# Patient Record
Sex: Male | Born: 1945 | Race: White | Hispanic: No | Marital: Married | Smoking: Former smoker
Health system: Southern US, Community
[De-identification: ages and names within clinical notes are randomized; demographics above are authoritative.]

## PROBLEM LIST (undated history)

## (undated) DIAGNOSIS — K219 Gastro-esophageal reflux disease without esophagitis: Secondary | ICD-10-CM

## (undated) DIAGNOSIS — E11621 Type 2 diabetes mellitus with foot ulcer: Secondary | ICD-10-CM

## (undated) DIAGNOSIS — M47816 Spondylosis without myelopathy or radiculopathy, lumbar region: Secondary | ICD-10-CM

## (undated) DIAGNOSIS — N182 Chronic kidney disease, stage 2 (mild): Secondary | ICD-10-CM

## (undated) DIAGNOSIS — H5440 Blindness, one eye, unspecified eye: Secondary | ICD-10-CM

## (undated) DIAGNOSIS — L97519 Non-pressure chronic ulcer of other part of right foot with unspecified severity: Secondary | ICD-10-CM

## (undated) DIAGNOSIS — C189 Malignant neoplasm of colon, unspecified: Secondary | ICD-10-CM

## (undated) DIAGNOSIS — Z972 Presence of dental prosthetic device (complete) (partial): Secondary | ICD-10-CM

## (undated) DIAGNOSIS — N200 Calculus of kidney: Secondary | ICD-10-CM

## (undated) DIAGNOSIS — E785 Hyperlipidemia, unspecified: Secondary | ICD-10-CM

## (undated) DIAGNOSIS — D649 Anemia, unspecified: Secondary | ICD-10-CM

## (undated) DIAGNOSIS — I10 Essential (primary) hypertension: Secondary | ICD-10-CM

## (undated) DIAGNOSIS — F02B0 Dementia in other diseases classified elsewhere, moderate, without behavioral disturbance, psychotic disturbance, mood disturbance, and anxiety: Secondary | ICD-10-CM

## (undated) DIAGNOSIS — I700 Atherosclerosis of aorta: Secondary | ICD-10-CM

## (undated) DIAGNOSIS — E119 Type 2 diabetes mellitus without complications: Secondary | ICD-10-CM

## (undated) DIAGNOSIS — G3183 Dementia with Lewy bodies: Secondary | ICD-10-CM

## (undated) DIAGNOSIS — E7800 Pure hypercholesterolemia, unspecified: Secondary | ICD-10-CM

## (undated) DIAGNOSIS — E1122 Type 2 diabetes mellitus with diabetic chronic kidney disease: Secondary | ICD-10-CM

## (undated) DIAGNOSIS — F0390 Unspecified dementia without behavioral disturbance: Secondary | ICD-10-CM

## (undated) DIAGNOSIS — Z87442 Personal history of urinary calculi: Secondary | ICD-10-CM

## (undated) HISTORY — PX: EYE SURGERY: SHX253

## (undated) HISTORY — PX: HERNIA REPAIR: SHX51

## (undated) HISTORY — DX: Malignant neoplasm of colon, unspecified: C18.9

## (undated) HISTORY — PX: APPENDECTOMY: SHX54

## (undated) HISTORY — PX: TONSILLECTOMY: SUR1361

## (undated) HISTORY — PX: KIDNEY STONE SURGERY: SHX686

---

## 2003-11-05 ENCOUNTER — Other Ambulatory Visit: Payer: Self-pay

## 2005-02-05 ENCOUNTER — Emergency Department: Payer: Self-pay | Admitting: Emergency Medicine

## 2005-10-01 ENCOUNTER — Emergency Department: Payer: Self-pay | Admitting: Emergency Medicine

## 2005-11-02 ENCOUNTER — Emergency Department: Payer: Self-pay | Admitting: Emergency Medicine

## 2009-10-19 ENCOUNTER — Observation Stay: Payer: Self-pay | Admitting: Internal Medicine

## 2012-06-23 ENCOUNTER — Emergency Department: Payer: Self-pay | Admitting: Emergency Medicine

## 2012-06-23 LAB — CBC
HGB: 13.7 g/dL (ref 13.0–18.0)
MCH: 31.1 pg (ref 26.0–34.0)
MCHC: 34.9 g/dL (ref 32.0–36.0)
Platelet: 232 10*3/uL (ref 150–440)
RBC: 4.41 10*6/uL (ref 4.40–5.90)
RDW: 13.9 % (ref 11.5–14.5)

## 2012-06-23 LAB — URIC ACID: Uric Acid: 5.4 mg/dL (ref 3.5–7.2)

## 2013-10-24 ENCOUNTER — Emergency Department: Payer: Self-pay | Admitting: Emergency Medicine

## 2014-04-08 ENCOUNTER — Emergency Department: Payer: Self-pay | Admitting: Emergency Medicine

## 2014-04-08 LAB — COMPREHENSIVE METABOLIC PANEL
Albumin: 4.1 g/dL (ref 3.4–5.0)
Alkaline Phosphatase: 80 U/L
Anion Gap: 3 — ABNORMAL LOW (ref 7–16)
BILIRUBIN TOTAL: 0.6 mg/dL (ref 0.2–1.0)
BUN: 24 mg/dL — AB (ref 7–18)
CHLORIDE: 104 mmol/L (ref 98–107)
CO2: 29 mmol/L (ref 21–32)
Calcium, Total: 9.3 mg/dL (ref 8.5–10.1)
Creatinine: 1.11 mg/dL (ref 0.60–1.30)
EGFR (African American): >60
EGFR (Non-African Amer.): >60
Glucose: 187 mg/dL — ABNORMAL HIGH (ref 65–99)
Osmolality: 281 (ref 275–301)
Potassium: 4.4 mmol/L (ref 3.5–5.1)
SGOT(AST): 29 U/L (ref 15–37)
SGPT (ALT): 21 U/L (ref 12–78)
SODIUM: 136 mmol/L (ref 136–145)
Total Protein: 8.1 g/dL (ref 6.4–8.2)

## 2014-04-08 LAB — URINALYSIS, COMPLETE
Bilirubin,UR: NEGATIVE
Glucose,UR: 150 mg/dL (ref 0–75)
Ketone: NEGATIVE
Leukocyte Esterase: NEGATIVE
Nitrite: NEGATIVE
PH: 5 (ref 4.5–8.0)
RBC,UR: 13 /HPF (ref 0–5)
Specific Gravity: 1.029 (ref 1.003–1.030)
Squamous Epithelial: NONE SEEN
WBC UR: 3 /HPF (ref 0–5)

## 2014-04-08 LAB — CBC
HCT: 43.5 % (ref 40.0–52.0)
HGB: 14.2 g/dL (ref 13.0–18.0)
MCH: 29.7 pg (ref 26.0–34.0)
MCHC: 32.6 g/dL (ref 32.0–36.0)
MCV: 91 fL (ref 80–100)
Platelet: 229 10*3/uL (ref 150–440)
RBC: 4.77 10*6/uL (ref 4.40–5.90)
RDW: 13.7 % (ref 11.5–14.5)
WBC: 8.7 10*3/uL (ref 3.8–10.6)

## 2014-04-09 LAB — CBC WITH DIFFERENTIAL/PLATELET
BASOS ABS: 0 10*3/uL (ref 0.0–0.1)
Basophil %: 0.4 %
Eosinophil #: 0.1 10*3/uL (ref 0.0–0.7)
Eosinophil %: 1.1 %
HCT: 37.7 % — AB (ref 40.0–52.0)
HGB: 12.6 g/dL — ABNORMAL LOW (ref 13.0–18.0)
Lymphocyte #: 0.9 10*3/uL — ABNORMAL LOW (ref 1.0–3.6)
Lymphocyte %: 9 %
MCH: 30.4 pg (ref 26.0–34.0)
MCHC: 33.5 g/dL (ref 32.0–36.0)
MCV: 91 fL (ref 80–100)
MONOS PCT: 6.6 %
Monocyte #: 0.6 x10 3/mm (ref 0.2–1.0)
NEUTROS PCT: 82.9 %
Neutrophil #: 8.1 10*3/uL — ABNORMAL HIGH (ref 1.4–6.5)
Platelet: 193 10*3/uL (ref 150–440)
RBC: 4.16 10*6/uL — ABNORMAL LOW (ref 4.40–5.90)
RDW: 13.5 % (ref 11.5–14.5)
WBC: 9.8 10*3/uL (ref 3.8–10.6)

## 2014-04-09 LAB — BASIC METABOLIC PANEL
ANION GAP: 7 (ref 7–16)
BUN: 28 mg/dL — ABNORMAL HIGH (ref 7–18)
CHLORIDE: 103 mmol/L (ref 98–107)
Calcium, Total: 8.9 mg/dL (ref 8.5–10.1)
Co2: 26 mmol/L (ref 21–32)
Creatinine: 1.46 mg/dL — ABNORMAL HIGH (ref 0.60–1.30)
EGFR (African American): 57 — ABNORMAL LOW
EGFR (Non-African Amer.): 49 — ABNORMAL LOW
Glucose: 185 mg/dL — ABNORMAL HIGH (ref 65–99)
Osmolality: 282 (ref 275–301)
POTASSIUM: 3.9 mmol/L (ref 3.5–5.1)
Sodium: 136 mmol/L (ref 136–145)

## 2014-04-10 ENCOUNTER — Observation Stay: Payer: Self-pay | Admitting: Urology

## 2014-04-10 LAB — URINE CULTURE

## 2014-04-13 DIAGNOSIS — E785 Hyperlipidemia, unspecified: Secondary | ICD-10-CM | POA: Insufficient documentation

## 2014-04-13 DIAGNOSIS — E119 Type 2 diabetes mellitus without complications: Secondary | ICD-10-CM | POA: Insufficient documentation

## 2014-04-13 DIAGNOSIS — E1122 Type 2 diabetes mellitus with diabetic chronic kidney disease: Secondary | ICD-10-CM | POA: Insufficient documentation

## 2014-04-13 DIAGNOSIS — I10 Essential (primary) hypertension: Secondary | ICD-10-CM | POA: Insufficient documentation

## 2014-04-13 DIAGNOSIS — N182 Chronic kidney disease, stage 2 (mild): Secondary | ICD-10-CM

## 2014-04-13 DIAGNOSIS — K219 Gastro-esophageal reflux disease without esophagitis: Secondary | ICD-10-CM | POA: Insufficient documentation

## 2014-04-17 ENCOUNTER — Ambulatory Visit: Payer: Self-pay | Admitting: Urology

## 2014-08-16 DIAGNOSIS — I700 Atherosclerosis of aorta: Secondary | ICD-10-CM | POA: Insufficient documentation

## 2015-02-21 NOTE — H&P (Signed)
PATIENT NAME:  Alex SilversmithGEORGE, Alex W MR#:  [id number] DATE OF BIRTH:  11/20/1945  DATE OF ADMISSION:  04/10/2014  CHIEF COMPLAINT: Right flank pain.   HISTORY OF PRESENT ILLNESS: Mr. [name] is a 69 year old white male with sudden onset of right flank pain on 06/09. He went to ER at that time, had a CT scan indicating the presence of a distal 4 mm stone and a 10 mm right renal stone. Pain became severe and he went back to the ER this morning. He also is being treated for urinary tract infection. He may have passed a stone in the distant past. He has never had surgery for stone disease.   ALLERGIES: No drug allergies.   CURRENT MEDICATIONS: Include: Metformin, lisinopril and pravastatin.   PAST SURGICAL HISTORY: Tonsillectomy in 1957. Right toe surgery in 2000.   PAST AND CURRENT MEDICAL CONDITIONS:  1. Diabetes.  2. Hypertension.   REVIEW OF SYSTEMS: The patient denied chest pain, shortness of breath, stroke, or heart disease.   FAMILY HISTORY: Negative for renal disease.   SOCIAL HISTORY: The patient denied tobacco or alcohol use.   PHYSICAL EXAMINATION:  GENERAL: A well-nourished white male in no distress.  HEENT: Sclerae were clear. Pupils were equally round, reactive to light and accommodation.  NECK: Supple. No palpable cervical adenopathy.  LUNGS: Clear to auscultation.  HEART: Regular rhythm and rate without audible murmurs.  ABDOMEN: Soft, nontender abdomen. No CVA tenderness.   GU/RECTAL: Deferred.  NEUROMUSCULAR: Alert and oriented x 3.   IMPRESSION:  1. Right ureterolithiasis.  2. Right nephrolithiasis. 3. Urinary tract infection.   PLAN: Cystoscopy with stent placement.    ____________________________ Suszanne ConnersMichael R. Evelene CroonWolff, MD mrw:sg D: 04/10/2014 07:36:49 ET T: 04/10/2014 07:59:25 ET JOB#: [id number]  cc: Suszanne ConnersMichael R. Evelene CroonWolff, MD, <Dictator> Orson ApeMICHAEL R Rhesa Forsberg MD ELECTRONICALLY SIGNED 04/10/2014 10:36

## 2015-02-21 NOTE — Op Note (Signed)
PATIENT NAME:  Alex SilversmithGEORGE, Alex W MR#:  [id number] DATE OF BIRTH:  06-Jan-1946  DATE OF PROCEDURE:  04/10/2014  PREOPERATIVE DIAGNOSES:  1.  Right ureterolithiasis.  2.  Right nephrolithiasis.   POSTOPERATIVE DIAGNOSIS:  1.  Right ureterolithiasis.  2.  Right nephrolithiasis.  3.  Bladder stone. 4.  Benign prostatic hypertrophy.  PROCEDURES PERFORMED: 1.  Cystoscopy with right stent placement.  2.  Fluoroscopy. 3.  Removal of bladder stone.   SURGEON: Anola GurneyMichael Wolff, M.D.   ANESTHETISTS: Dr. Ronalee BeltsBhandari and Dr. Evelene CroonWolff.   ANESTHETIC METHOD: General per Ronalee BeltsBhandari and local per Dr. Evelene CroonWolff.   INDICATIONS: See the history and physical. After informed consent, the patient requests the above procedures.   OPERATIVE SUMMARY: After adequate general anesthesia had been obtained, the patient was placed into dorsal lithotomy position and the perineum was prepped and draped in the usual fashion. Fluoroscopy confirmed presence of a 10 mm right renal stone and a 4 mm distal right ureteral stone. At this point, the 21-French cystoscope was coupled with the camera and then visually advanced into the bladder. The bladder was thoroughly inspected. The patient had a 4 mm stone present in the bladder. The stone was irrigated out of the bladder with a Toomey syringe. The bladder was moderately trabeculated. The patient had lateral lobe hypertrophy of the prostate with a prosthetic urethral length of 4 cm. No bladder mucosal tumors were identified. At this point, a 0.035 Glidewire was advanced up the right ureter under fluoroscopic guidance. A 6 x 26 cm double pigtail stent with suture attached distally was advanced over the guidewire and positioned in the ureter. Guidewire was then removed taking care to leave the stent in position. The bladder was then drained and the cystoscope was removed. Then 10 mL of viscous Xylocaine was instilled within the urethra and the bladder. The procedure was then terminated, and the patient  was transferred to the recovery room in stable condition.  ____________________________ Suszanne ConnersMichael R. Evelene CroonWolff, MD mrw:sb D: 04/10/2014 14:15:55 ET T: 04/10/2014 14:33:32 ET JOB#: [id number]  cc: Suszanne ConnersMichael R. Evelene CroonWolff, MD, <Dictator> Orson ApeMICHAEL R WOLFF MD ELECTRONICALLY SIGNED 04/10/2014 15:32

## 2015-03-22 ENCOUNTER — Emergency Department
Admission: EM | Admit: 2015-03-22 | Discharge: 2015-03-22 | Disposition: A | Discharge status: Home / Self Care | Payer: Medicare Other | Attending: Emergency Medicine | Admitting: Emergency Medicine

## 2015-03-22 ENCOUNTER — Encounter: Payer: Self-pay | Admitting: Emergency Medicine

## 2015-03-22 ENCOUNTER — Emergency Department: Payer: Medicare Other

## 2015-03-22 DIAGNOSIS — R109 Unspecified abdominal pain: Secondary | ICD-10-CM | POA: Diagnosis present

## 2015-03-22 DIAGNOSIS — E119 Type 2 diabetes mellitus without complications: Secondary | ICD-10-CM | POA: Insufficient documentation

## 2015-03-22 DIAGNOSIS — N200 Calculus of kidney: Secondary | ICD-10-CM | POA: Diagnosis not present

## 2015-03-22 HISTORY — DX: Type 2 diabetes mellitus without complications: E11.9

## 2015-03-22 HISTORY — DX: Calculus of kidney: N20.0

## 2015-03-22 LAB — URINALYSIS COMPLETE WITH MICROSCOPIC (ARMC ONLY)
BILIRUBIN URINE: NEGATIVE
Glucose, UA: >500 mg/dL — AB
Ketones, ur: NEGATIVE mg/dL
LEUKOCYTES UA: NEGATIVE
Nitrite: NEGATIVE
PROTEIN: 100 mg/dL — AB
SPECIFIC GRAVITY, URINE: 1.023 (ref 1.005–1.030)
Squamous Epithelial / LPF: NONE SEEN
pH: 5 (ref 5.0–8.0)

## 2015-03-22 LAB — COMPREHENSIVE METABOLIC PANEL
ALT: 18 U/L (ref 17–63)
AST: 20 U/L (ref 15–41)
Albumin: 4.2 g/dL (ref 3.5–5.0)
Alkaline Phosphatase: 78 U/L (ref 38–126)
Anion gap: 7 (ref 5–15)
BUN: 22 mg/dL — ABNORMAL HIGH (ref 6–20)
CHLORIDE: 102 mmol/L (ref 101–111)
CO2: 27 mmol/L (ref 22–32)
Calcium: 9 mg/dL (ref 8.9–10.3)
Creatinine, Ser: 0.97 mg/dL (ref 0.61–1.24)
GFR calc Af Amer: >60 mL/min (ref >60)
GLUCOSE: 231 mg/dL — AB (ref 65–99)
POTASSIUM: 4.2 mmol/L (ref 3.5–5.1)
Sodium: 136 mmol/L (ref 135–145)
Total Bilirubin: 0.5 mg/dL (ref 0.3–1.2)
Total Protein: 7.8 g/dL (ref 6.5–8.1)

## 2015-03-22 LAB — CBC
HCT: 38.8 % — ABNORMAL LOW (ref 40.0–52.0)
HEMOGLOBIN: 12.7 g/dL — AB (ref 13.0–18.0)
MCH: 29.4 pg (ref 26.0–34.0)
MCHC: 32.9 g/dL (ref 32.0–36.0)
MCV: 89.4 fL (ref 80.0–100.0)
Platelets: 212 10*3/uL (ref 150–440)
RBC: 4.34 MIL/uL — ABNORMAL LOW (ref 4.40–5.90)
RDW: 15 % — ABNORMAL HIGH (ref 11.5–14.5)
WBC: 7.1 10*3/uL (ref 3.8–10.6)

## 2015-03-22 MED ORDER — KETOROLAC TROMETHAMINE 30 MG/ML IJ SOLN
INTRAMUSCULAR | Status: AC
  Administered 2015-03-22: 30 mg via INTRAVENOUS
  Filled 2015-03-22: qty 1

## 2015-03-22 MED ORDER — KETOROLAC TROMETHAMINE 30 MG/ML IJ SOLN
30.0000 mg | Freq: Once | INTRAMUSCULAR | Status: AC
  Administered 2015-03-22: 30 mg via INTRAVENOUS

## 2015-03-22 MED ORDER — FENTANYL CITRATE (PF) 100 MCG/2ML IJ SOLN
25.0000 ug | Freq: Once | INTRAMUSCULAR | Status: AC
  Administered 2015-03-22: 25 ug via INTRAVENOUS

## 2015-03-22 MED ORDER — ONDANSETRON HCL 4 MG/2ML IJ SOLN
INTRAMUSCULAR | Status: AC
Start: 2015-03-22 — End: 2015-03-22
  Administered 2015-03-22: 4 mg via INTRAVENOUS
  Filled 2015-03-22: qty 2

## 2015-03-22 MED ORDER — MORPHINE SULFATE 2 MG/ML IJ SOLN
2.0000 mg | Freq: Once | INTRAMUSCULAR | Status: AC
  Administered 2015-03-22: 2 mg via INTRAVENOUS

## 2015-03-22 MED ORDER — ONDANSETRON HCL 4 MG/2ML IJ SOLN
4.0000 mg | Freq: Once | INTRAMUSCULAR | Status: AC
  Administered 2015-03-22: 4 mg via INTRAVENOUS

## 2015-03-22 MED ORDER — MORPHINE SULFATE 2 MG/ML IJ SOLN
INTRAMUSCULAR | Status: AC
  Administered 2015-03-22: 2 mg via INTRAVENOUS
  Filled 2015-03-22: qty 1

## 2015-03-22 MED ORDER — FENTANYL CITRATE (PF) 100 MCG/2ML IJ SOLN
INTRAMUSCULAR | Status: AC
  Administered 2015-03-22: 25 ug via INTRAVENOUS
  Filled 2015-03-22: qty 2

## 2015-03-22 MED ORDER — TAMSULOSIN HCL 0.4 MG PO CAPS: 0.4000 mg | ORAL_CAPSULE | Freq: Every day | ORAL | Status: DC

## 2015-03-22 MED ORDER — OXYCODONE-ACETAMINOPHEN 5-325 MG PO TABS: 1.0000 | ORAL_TABLET | ORAL | Status: DC | PRN

## 2015-03-22 NOTE — ED Notes (Signed)
Pt alert x4 respirations easy non labored. Ppp equal strong. Cap refill prompt

## 2015-03-22 NOTE — Discharge Instructions (Signed)

## 2015-03-22 NOTE — ED Notes (Signed)
Patient presents to the ED with right flank pain, nausea, and vomiting.  Patient states emesis was clear/white.  Patient denies abdominal pain, denies dysuria.  Patient states pain started this morning.  Patient reports sharp, constant pain.  Patient reports history of kidney stones and states this feels similar.

## 2015-03-22 NOTE — ED Provider Notes (Signed)
[facility] Emergency Department Provider Note  ____________________________________________  Time seen: 12:00 PM  I have reviewed the triage vital signs and the nursing notes.   HISTORY  Chief Complaint Flank Pain     HPI De BurrsLarry W Homan Sr. is a 69 y.o. male resents with 10 out of 10 sharp nonradiating rightflank pain 1 day. Positive nonbloody emesis. Patient admits to previous kidney stones 2 on the left measuring 4 mm and 10 mm. One of which required lithotripsy performed by Dr. Evelene CroonWolff. Pain is not alleviated or worsened with anything    Past Medical History  Diagnosis Date  . Diabetes mellitus without complication   . Kidney stones     There are no active problems to display for this patient.   No past surgical history on file.  No [place] outpatient prescriptions on file.  Allergies Review of patient's allergies indicates no known allergies.  No family history on file.  Social History History  Substance Use Topics  . Smoking status: Not on file  . Smokeless tobacco: Not on file  . Alcohol Use: Not on file    Review of Systems  Constitutional: Negative for fever. Eyes: Negative for visual changes. ENT: Negative for sore throat. Cardiovascular: Negative for chest pain. Respiratory: Negative for shortness of breath. Gastrointestinal: Negative for abdominal pain, positive vomiting  Genitourinary: Negative for dysuria. Positive right flank pain Musculoskeletal: Negative for back pain. Skin: Negative for rash. Neurological: Negative for headaches, focal weakness or numbness.  10-point ROS otherwise negative.  ____________________________________________   PHYSICAL EXAM:  VITAL SIGNS: ED Triage Vitals  Enc Vitals Group     BP 03/22/15 0946 177/87 mmHg     Pulse Rate 03/22/15 0946 62     Resp 03/22/15 0946 18     Temp 03/22/15 0946 98.7 F (37.1 C)     Temp Source 03/22/15 0946 Oral     SpO2 03/22/15 0946 100 %   Weight 03/22/15 0946 187 lb (84.823 kg)     Height 03/22/15 0946 6\' 1"  (1.854 m)     Head Cir --      Peak Flow --      Pain Score 03/22/15 0947 10     Pain Loc --      Pain Edu? --      Excl. in GC? --      Constitutional: Alert and oriented. Apparent distress Eyes: Conjunctivae are normal. PERRL. Normal extraocular movements. ENT   Head: Normocephalic and atraumatic.   Nose: No congestion/rhinnorhea.   Mouth/Throat: Mucous membranes are moist.   Neck: No stridor. Cardiovascular: Normal rate, regular rhythm. Normal and symmetric distal pulses are present in all extremities. No murmurs, rubs, or gallops. Respiratory: Normal respiratory effort without tachypnea nor retractions. Breath sounds are clear and equal bilaterally. No wheezes/rales/rhonchi. Gastrointestinal: Soft and nontender. No distention. There is no CVA tenderness. Genitourinary: deferred Musculoskeletal: Nontender with normal range of motion in all extremities. No joint effusions.  No lower extremity tenderness nor edema. Neurologic:  Normal speech and language. No gross focal neurologic deficits are appreciated. Speech is normal.  Skin:  Skin is warm, dry and intact. No rash noted. Psychiatric: Mood and affect are normal. Speech and behavior are normal. Patient exhibits appropriate insight and judgment.  ____________________________________________    LABS (pertinent positives/negatives)  Labs Reviewed  URINALYSIS COMPLETEWITH MICROSCOPIC ([facility])  - Abnormal; Notable for the following:    Color, Urine YELLOW (*)    APPearance CLEAR (*)    Glucose, UA >  500 (*)    Hgb urine dipstick 2+ (*)    Protein, ur 100 (*)    Bacteria, UA RARE (*)    All other components within normal limits  CBC - Abnormal; Notable for the following:    RBC 4.34 (*)    Hemoglobin 12.7 (*)    HCT 38.8 (*)    RDW 15.0 (*)    All other components within normal limits  COMPREHENSIVE METABOLIC PANEL - Abnormal; Notable for  the following:    Glucose, Bld 231 (*)    BUN 22 (*)    All other components within normal limits       RADIOLOGY  4 x 5 mm ureterolithiasis at the ureters vesicular junction. No hydronephrosis noted.  ____________________________________________    INITIAL IMPRESSION / ASSESSMENT AND PLAN / ED COURSE  Pertinent labs & imaging results that were available during my care of the patient were reviewed by me and considered in my medical decision making (see chart for details).  History physical exam, hematuria confirmed on UA consistent with possible ureterolithiasis. CT scan ordered to confirm. IV morphine and Zofran given for analgesia and antiemetic respectively. Pain improved status post analgesia. CT scan revealed right obstructing kidney stone in the UVJ  ____________________________________________   FINAL CLINICAL IMPRESSION(S) / ED DIAGNOSES  Final diagnoses:  Kidney stone on right side      Alex Current, MD 03/25/15 2325

## 2016-08-08 ENCOUNTER — Other Ambulatory Visit: Payer: Self-pay | Admitting: Orthopedic Surgery

## 2016-08-08 DIAGNOSIS — S46001A Unspecified injury of muscle(s) and tendon(s) of the rotator cuff of right shoulder, initial encounter: Secondary | ICD-10-CM

## 2016-08-19 ENCOUNTER — Ambulatory Visit
Admission: RE | Admit: 2016-08-19 | Discharge: 2016-08-19 | Disposition: A | Discharge status: Home / Self Care | Payer: Medicare Other | Source: Ambulatory Visit | Attending: Orthopedic Surgery | Admitting: Orthopedic Surgery

## 2016-08-19 DIAGNOSIS — X58XXXA Exposure to other specified factors, initial encounter: Secondary | ICD-10-CM | POA: Insufficient documentation

## 2016-08-19 DIAGNOSIS — M19011 Primary osteoarthritis, right shoulder: Secondary | ICD-10-CM | POA: Diagnosis not present

## 2016-08-19 DIAGNOSIS — M25311 Other instability, right shoulder: Secondary | ICD-10-CM | POA: Diagnosis not present

## 2016-08-19 DIAGNOSIS — M75121 Complete rotator cuff tear or rupture of right shoulder, not specified as traumatic: Secondary | ICD-10-CM | POA: Diagnosis not present

## 2016-08-19 DIAGNOSIS — S46001A Unspecified injury of muscle(s) and tendon(s) of the rotator cuff of right shoulder, initial encounter: Secondary | ICD-10-CM | POA: Diagnosis present

## 2016-09-14 DIAGNOSIS — M75121 Complete rotator cuff tear or rupture of right shoulder, not specified as traumatic: Secondary | ICD-10-CM | POA: Insufficient documentation

## 2016-09-14 DIAGNOSIS — M7581 Other shoulder lesions, right shoulder: Secondary | ICD-10-CM | POA: Insufficient documentation

## 2016-09-30 ENCOUNTER — Encounter
Admission: RE | Admit: 2016-09-30 | Discharge: 2016-09-30 | Disposition: A | Discharge status: Home / Self Care | Payer: Medicare Other | Source: Ambulatory Visit | Attending: Surgery | Admitting: Surgery

## 2016-09-30 DIAGNOSIS — Z01810 Encounter for preprocedural cardiovascular examination: Secondary | ICD-10-CM | POA: Insufficient documentation

## 2016-09-30 DIAGNOSIS — Z01812 Encounter for preprocedural laboratory examination: Secondary | ICD-10-CM | POA: Insufficient documentation

## 2016-09-30 DIAGNOSIS — I10 Essential (primary) hypertension: Secondary | ICD-10-CM | POA: Diagnosis not present

## 2016-09-30 HISTORY — DX: Personal history of urinary calculi: Z87.442

## 2016-09-30 HISTORY — DX: Anemia, unspecified: D64.9

## 2016-09-30 HISTORY — DX: Gastro-esophageal reflux disease without esophagitis: K21.9

## 2016-09-30 LAB — CBC
HEMATOCRIT: 37.4 % — AB (ref 40.0–52.0)
HEMOGLOBIN: 12.6 g/dL — AB (ref 13.0–18.0)
MCH: 30.2 pg (ref 26.0–34.0)
MCHC: 33.7 g/dL (ref 32.0–36.0)
MCV: 89.6 fL (ref 80.0–100.0)
Platelets: 199 10*3/uL (ref 150–440)
RBC: 4.18 MIL/uL — AB (ref 4.40–5.90)
RDW: 14.6 % — ABNORMAL HIGH (ref 11.5–14.5)
WBC: 4.2 10*3/uL (ref 3.8–10.6)

## 2016-09-30 NOTE — Patient Instructions (Signed)
  Your procedure is scheduled on: 10-06-16 [facility](THURSDAY) Report to Same Day Surgery 2nd floor medical mall [facility](Medical Mall Entrance-take elevator on left to 2nd floor.  Check in with surgery information desk.) To find out your arrival time please call [phone] between 1PM - 3PM on 10-05-16 [facility](WEDNESDAY)  Remember: Instructions that are not followed completely may result in serious medical risk, up to and including death, or upon the discretion of your surgeon and anesthesiologist your surgery may need to be rescheduled.    _x___ 1. Do not eat food or drink liquids after midnight. No gum chewing or hard candies.     __x__ 2. No Alcohol for 24 hours before or after surgery.   __x__3. No Smoking for 24 prior to surgery.   ____  4. Bring all medications with you on the day of surgery if instructed.    __x__ 5. Notify your doctor if there is any change in your medical condition     (cold, fever, infections).     Do not wear jewelry, make-up, hairpins, clips or nail polish.  Do not wear lotions, powders, or perfumes. You may wear deodorant.  Do not shave 48 hours prior to surgery. Men may shave face and neck.  Do not bring valuables to the hospital.    [facility] not [facility] any belongings or valuables.               Contacts, dentures or bridgework may not be worn into surgery.  Leave your suitcase in the car. After surgery it may be brought to your room.  For patients admitted to the hospital, discharge time is determined by your treatment team.   Patients discharged the day of surgery will not be allowed to drive home.  You will need someone to drive you home and stay with you the night of your procedure.    Please read over the following fact sheets that you were given:   [facility] Preparing for Surgery and or MRSA Information   _X___ Take these medicines the morning of surgery with A SIP OF WATER:    1. PRILOSEC  2. TAKE A PRILOSEC ON Wednesday NIGHT BEFORE BED  (10-05-16)  3.  4.  5.  6.  ____Fleets enema or Magnesium Citrate as directed.   _x___ Use CHG Soap or sage wipes as directed on instruction sheet   ____ Use inhalers on the day of surgery and bring to hospital day of surgery  _X___ Stop metformin 2 days prior to surgery-LAST DOSE OF GLYBURIDE-METFORMIN ON Monday, December 4TH    ____ Take 1/2 of usual insulin dose the night before surgery and none on the morning of surgery.   _X___ Stop Aspirin, Coumadin, Pllavix ,Eliquis, Effient, or Pradaxa-STOP ASPIRIN NOW  x__ Stop Anti-inflammatories such as Advil, Aleve, Ibuprofen, Motrin, Naproxen,          Naprosyn, Goodies powders or aspirin products. Ok to take Tylenol.   ____ Stop supplements until after surgery.    ____ Bring C-Pap to the hospital.

## 2016-10-05 NOTE — Pre-Procedure Instructions (Signed)
Alex Donath, MD - 09/16/2016 9:30 AM EST Formatting of this note may be different from the original. MEDICARE WELLNESS VISIT    Providers [facility] [place] [facility], Dr Elvina Mattes, Lupita Leash ortho, Dr Ouida Sills Functional Assessment (1) Hearing: Demonstrates no difficulty in hearing during normal conversation (2) Risk of Falls: Patient denies any falls or near falls in the last year (3) Home Safety: Patient feels secure in their home (4) Activities of Daily Living: Independently manages personal grooming and household chores, including cooking, cleaning and laundry. Manages Personal finances without assistance.    Depression Screening Denies loss of interest in normal activities, has no episodes of weeping or anxiety and reports no changes in appetite or sleep.  Cognitive Impairment Patient denies episodes of loosing things, being forgetful. Seems oriented to person, place and time. Responses appear appropriate and timely to this observer.   PREVENTION PLAN  Item Name Frequency Month Due Year(s) Due Cardiovascular every 6 months on pravastatin Diabetes every 4 months with disease Colonoscopy 2010 last and due 2020 Glaucoma Annually via [facility] [facility] Flu Vaccine Annually Fall Pneumococcal Vaccine 2011 Prevnar 13 prescription 11-17 zostavax prescription given 6-14 but didn't get Smoking Cessation Not Applicable   Other Personalized Health Advice Diabetic diet and walking frequently and yard work  End of Life Counseling No living will, no long term life support. Full code.   Physician Kittson Ouida Sills, M.D.   Alex Soto is a 70 y.o. male here for follow up of their medical problems  CHIEF COMPLAINT:  Follow up medical problems in the problem list and as discussed in the history and assessment areas as well as new complaints as listed.   Patient Active Problem List  Diagnosis  . Type 2 diabetes mellitus with stage 2 chronic kidney  disease (CMS-HCC)  . GERD (gastroesophageal reflux disease)  . HTN (hypertension), benign  . Hyperlipidemia, unspecified  . Atherosclerosis of abdominal aorta (CMS-HCC)  . Complete tear of right rotator cuff  . Tendinitis of right rotator cuff   HISTORY OF PRESENT ILLNESS:  Atherosclerosis of abdominal aorta No abdominal pain or back pain on medical treatment  HTN (hypertension), benign Is compliant with hypertensive medications without clear side effects or lack of control.   Hyperlipidemia Appropriate diet is being attempted and myalgia's are not noted.   Type 2 diabetes mellitus with stage 2 chronic kidney disease Continuing on diet as well as prescribed regimen without severe hypoglycemia being noted. Nausea and itching are not symptomatic and nsaids are being avoided.   Past Medical History:  Diagnosis Date  . 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 Diabetes mellitus type 2, uncomplicated (CMS-HCC)  . DJD (degenerative joint disease), lumbar  . Hyperlipidemia, unspecified  . Hypertension   Past Surgical History:  Procedure Laterality Date  . APPENDECTOMY  . TONSILLECTOMY   No fever chills or sweats No nausea, vomiting or diarrhea No chest pain, shortness of breath   Social History   Social History  . Marital status: Married  Spouse name: N/A  . Number of children: N/A  . Years of education: N/A   Occupational History  . Not on file.   Social History Main Topics  . Smoking status: Former Smoker  Quit date: 04/02/1974  . Smokeless tobacco: Never Used  . Alcohol use No  . Drug use: No  . Sexual activity: Defer   Other Topics Concern  . Not on file   Social History Narrative  . No  narrative on file     Current Outpatient Prescriptions:  . glyBURIDE-metFORMIN (GLUCOVANCE) 2.5-500 mg tablet, Take 1 tablet by mouth 2 (two) times daily., Disp: 180 tablet, Rfl: 11 . ipratropium (ATROVENT) 0.06 % nasal spray, Place 2 sprays into both nostrils 3  (three) times daily., Disp: 15 mL, Rfl: 11 . lisinopril-hydrochlorothiazide (PRINZIDE,ZESTORETIC) 20-12.5 mg tablet, Take 1 tablet by mouth once daily., Disp: 90 tablet, Rfl: 11 . pioglitazone (ACTOS) 15 MG tablet, Take 1 tablet (15 mg total) by mouth once daily., Disp: 30 tablet, Rfl: 11 . pneumococcal vaccine 13-valent, PF, (PREVNAR 13) IM syringe, Inject 0.5 mLs into the muscle once for 1 dose., Disp: 1 each, Rfl: 0 . pravastatin (PRAVACHOL) 80 MG tablet, Take 1 tablet (80 mg total) by mouth once daily., Disp: 90 tablet, Rfl: 11  Vitals:  09/16/16 0939  BP: 137/71  Pulse: 68  Resp: 16  Body mass index is 24.54 kg/m. No acute distress Lungs; clear to ascultation Heart; Regular rate and rhythm  Abdomen; Soft and flat, normal bowel sounds Extremities; No clubbing, cyanosis or edema  Appointment on 08/30/2016  Component Date Value Ref Range Status  . Glucose 08/30/2016 155* 70 - 110 mg/dL Final  . Sodium 08/30/2016 140 136 - 145 mmol/L Final  . Potassium 08/30/2016 4.2 3.6 - 5.1 mmol/L Final  . Chloride 08/30/2016 100 97 - 109 mmol/L Final  . Carbon Dioxide (CO2) 08/30/2016 30.7 22.0 - 32.0 mmol/L Final  . Calcium 08/30/2016 9.7 8.7 - 10.3 mg/dL Final  . Urea Nitrogen (BUN) 08/30/2016 18 7 - 25 mg/dL Final  . Creatinine 08/30/2016 0.8 0.7 - 1.3 mg/dL Final  . Glomerular Filtration Rate (eGFR),* 08/30/2016 96 >60 mL/min/1.73sq m Final  . BUN/Crea Ratio 08/30/2016 22.5* 6.0 - 20.0 Final  . Anion Gap w/K 08/30/2016 13.5 6.0 - 16.0 Final  . Hemoglobin A1C 08/30/2016 7.6* 4.2 - 5.6 % Final  . Average Blood Glucose (Calc) 08/30/2016 171 mg/dL Final  . Protein, Total 08/30/2016 6.9 6.1 - 7.9 g/dL Final  . Albumin 08/30/2016 4.3 3.5 - 4.8 g/dL Final  . Bilirubin, Total 08/30/2016 0.6 0.3 - 1.2 mg/dL Final  . Bilirubin, Conjugated 08/30/2016 0.10 0.00 - 0.20 mg/dL Final  . Alk Phos (alkaline Phosphatase) 08/30/2016 66 34 - 104 U/L Final  . AST 08/30/2016 13 8 - 39 U/L Final  . ALT  08/30/2016 13 6 - 57 U/L Final  . Cholesterol, Total 08/30/2016 145 100 - 200 mg/dL Final  . Triglyceride 08/30/2016 111 35 - 199 mg/dL Final  . HDL (High Density Lipoprotein) Cho* 08/30/2016 39.8 29.0 - 71.0 mg/dL Final  . LDL (Low Density Lipoprotien), Cal* 08/30/2016 83 0 - 130 mg/dL Final  . VLDL Cholesterol 08/30/2016 22 mg/dL Final  . Cholesterol/HDL Ratio 08/30/2016 3.6 Final  . Urine Albumin, Random 08/30/2016 902 mg/L Final   ASSESSMENT AND PLAN:  Diagnoses and all orders for this visit:  Type 2 diabetes mellitus with stage 2 chronic kidney disease, without long-term current use of insulin (CMS-HCC) - Basic Metabolic Panel (BMP); Future - Hemoglobin A1C; Future - Hepatic Function Panel (HFP); Future - Lipid Panel w/calc LDL; Future - Microalbumin, Random Urine; Future  Routine general medical examination at a health care facility  HTN (hypertension), benign - Basic Metabolic Panel (BMP); Future - Hemoglobin A1C; Future - Hepatic Function Panel (HFP); Future - Lipid Panel w/calc LDL; Future - Microalbumin, Random Urine; Future  Pure hypercholesterolemia - Basic Metabolic Panel (BMP); Future - Hemoglobin A1C; Future - Hepatic Function Panel (  HFP); Future - Lipid Panel w/calc LDL; Future - Microalbumin, Random Urine; Future  Atherosclerosis of abdominal aorta (CMS-HCC)  Other orders - pneumococcal vaccine 13-valent, PF, (PREVNAR 13) IM syringe; Inject 0.5 mLs into the muscle once for 1 dose.  All listed problems are stable and associated medications and orders are noted. Will schedule a follow up appointment. Shoulder surgery is being considered and he can proceed directly as he is functional without symptoms

## 2016-10-06 ENCOUNTER — Encounter
Admission: RE | Disposition: A | Discharge status: Home / Self Care | Payer: Self-pay | Source: Ambulatory Visit | Attending: Surgery

## 2016-10-06 ENCOUNTER — Ambulatory Visit: Payer: Medicare Other | Admitting: Anesthesiology

## 2016-10-06 ENCOUNTER — Ambulatory Visit
Admission: RE | Admit: 2016-10-06 | Discharge: 2016-10-06 | Disposition: A | Discharge status: Home / Self Care | Payer: Medicare Other | Source: Ambulatory Visit | Attending: Surgery | Admitting: Surgery

## 2016-10-06 ENCOUNTER — Encounter: Payer: Self-pay | Admitting: *Deleted

## 2016-10-06 DIAGNOSIS — K219 Gastro-esophageal reflux disease without esophagitis: Secondary | ICD-10-CM | POA: Insufficient documentation

## 2016-10-06 DIAGNOSIS — E785 Hyperlipidemia, unspecified: Secondary | ICD-10-CM | POA: Insufficient documentation

## 2016-10-06 DIAGNOSIS — Z79899 Other long term (current) drug therapy: Secondary | ICD-10-CM | POA: Diagnosis not present

## 2016-10-06 DIAGNOSIS — H5461 Unqualified visual loss, right eye, normal vision left eye: Secondary | ICD-10-CM | POA: Insufficient documentation

## 2016-10-06 DIAGNOSIS — Z8249 Family history of ischemic heart disease and other diseases of the circulatory system: Secondary | ICD-10-CM | POA: Insufficient documentation

## 2016-10-06 DIAGNOSIS — M75101 Unspecified rotator cuff tear or rupture of right shoulder, not specified as traumatic: Secondary | ICD-10-CM | POA: Diagnosis present

## 2016-10-06 DIAGNOSIS — E119 Type 2 diabetes mellitus without complications: Secondary | ICD-10-CM | POA: Diagnosis not present

## 2016-10-06 DIAGNOSIS — M479 Spondylosis, unspecified: Secondary | ICD-10-CM | POA: Insufficient documentation

## 2016-10-06 DIAGNOSIS — M75121 Complete rotator cuff tear or rupture of right shoulder, not specified as traumatic: Secondary | ICD-10-CM | POA: Insufficient documentation

## 2016-10-06 DIAGNOSIS — Y9301 Activity, walking, marching and hiking: Secondary | ICD-10-CM | POA: Diagnosis not present

## 2016-10-06 DIAGNOSIS — Z87891 Personal history of nicotine dependence: Secondary | ICD-10-CM | POA: Diagnosis not present

## 2016-10-06 DIAGNOSIS — Z833 Family history of diabetes mellitus: Secondary | ICD-10-CM | POA: Insufficient documentation

## 2016-10-06 DIAGNOSIS — Z809 Family history of malignant neoplasm, unspecified: Secondary | ICD-10-CM | POA: Diagnosis not present

## 2016-10-06 DIAGNOSIS — I10 Essential (primary) hypertension: Secondary | ICD-10-CM | POA: Insufficient documentation

## 2016-10-06 DIAGNOSIS — S46011A Strain of muscle(s) and tendon(s) of the rotator cuff of right shoulder, initial encounter: Secondary | ICD-10-CM | POA: Diagnosis not present

## 2016-10-06 DIAGNOSIS — W010XXA Fall on same level from slipping, tripping and stumbling without subsequent striking against object, initial encounter: Secondary | ICD-10-CM | POA: Insufficient documentation

## 2016-10-06 HISTORY — PX: SHOULDER ARTHROSCOPY WITH BICEPS TENDON REPAIR: SHX5674

## 2016-10-06 HISTORY — PX: SHOULDER ARTHROSCOPY WITH SUBACROMIAL DECOMPRESSION: SHX5684

## 2016-10-06 HISTORY — PX: SHOULDER ARTHROSCOPY WITH OPEN ROTATOR CUFF REPAIR: SHX6092

## 2016-10-06 LAB — GLUCOSE, CAPILLARY
Glucose-Capillary: 140 mg/dL — ABNORMAL HIGH (ref 65–99)
Glucose-Capillary: 134 mg/dL — ABNORMAL HIGH (ref 65–99)

## 2016-10-06 SURGERY — ARTHROSCOPY, SHOULDER WITH REPAIR, ROTATOR CUFF, OPEN
Anesthesia: General | Site: Shoulder | Laterality: Right | Wound class: Clean

## 2016-10-06 MED ORDER — ONDANSETRON HCL 4 MG/2ML IJ SOLN
INTRAMUSCULAR | Status: DC | PRN
  Administered 2016-10-06: 4 mg via INTRAVENOUS

## 2016-10-06 MED ORDER — ROPIVACAINE HCL 5 MG/ML IJ SOLN
INTRAMUSCULAR | Status: AC
  Filled 2016-10-06: qty 40

## 2016-10-06 MED ORDER — SODIUM CHLORIDE 0.9 % IV SOLN
INTRAVENOUS | Status: DC
  Administered 2016-10-06: 11:00:00 via INTRAVENOUS

## 2016-10-06 MED ORDER — OXYCODONE HCL 5 MG PO TABS: 5.0000 mg | ORAL_TABLET | ORAL | 0 refills | Status: DC | PRN

## 2016-10-06 MED ORDER — BUPIVACAINE HCL (PF) 0.5 % IJ SOLN
INTRAMUSCULAR | Status: AC
  Filled 2016-10-06: qty 30

## 2016-10-06 MED ORDER — LIDOCAINE HCL (PF) 4 % IJ SOLN
INTRAMUSCULAR | Status: DC | PRN
  Administered 2016-10-06: 4 mL via RESPIRATORY_TRACT

## 2016-10-06 MED ORDER — SUGAMMADEX SODIUM 200 MG/2ML IV SOLN
INTRAVENOUS | Status: DC | PRN
  Administered 2016-10-06: 170 mg via INTRAVENOUS

## 2016-10-06 MED ORDER — PROPOFOL 10 MG/ML IV BOLUS
INTRAVENOUS | Status: DC | PRN
  Administered 2016-10-06: 180 mg via INTRAVENOUS

## 2016-10-06 MED ORDER — FENTANYL CITRATE (PF) 100 MCG/2ML IJ SOLN
25.0000 ug | INTRAMUSCULAR | Status: DC | PRN
  Administered 2016-10-06 (×4): 25 ug via INTRAVENOUS

## 2016-10-06 MED ORDER — PHENYLEPHRINE HCL 10 MG/ML IJ SOLN
INTRAVENOUS | Status: DC | PRN
  Administered 2016-10-06: 30 ug/min via INTRAVENOUS

## 2016-10-06 MED ORDER — FENTANYL CITRATE (PF) 100 MCG/2ML IJ SOLN
INTRAMUSCULAR | Status: AC
  Administered 2016-10-06: 25 ug via INTRAVENOUS
  Filled 2016-10-06: qty 2

## 2016-10-06 MED ORDER — ROPIVACAINE HCL 5 MG/ML IJ SOLN
30.0000 mL | Freq: Once | INTRAMUSCULAR | Status: AC
  Administered 2016-10-06: 30 mL via PERINEURAL

## 2016-10-06 MED ORDER — MIDAZOLAM HCL 2 MG/2ML IJ SOLN
INTRAMUSCULAR | Status: AC
  Administered 2016-10-06: 1.5 mg via INTRAVENOUS
  Filled 2016-10-06: qty 2

## 2016-10-06 MED ORDER — LIDOCAINE HCL (CARDIAC) 20 MG/ML IV SOLN
INTRAVENOUS | Status: DC | PRN
  Administered 2016-10-06: 100 mg via INTRAVENOUS

## 2016-10-06 MED ORDER — BUPIVACAINE-EPINEPHRINE 0.25% -1:200000 IJ SOLN
INTRAMUSCULAR | Status: DC | PRN
  Administered 2016-10-06: 30 mL

## 2016-10-06 MED ORDER — EPINEPHRINE PF 1 MG/ML IJ SOLN
INTRAMUSCULAR | Status: DC | PRN
  Administered 2016-10-06: 2 mL

## 2016-10-06 MED ORDER — ONDANSETRON HCL 4 MG/2ML IJ SOLN: 4.0000 mg | Freq: Once | INTRAMUSCULAR | Status: DC | PRN

## 2016-10-06 MED ORDER — EPINEPHRINE PF 1 MG/ML IJ SOLN
INTRAMUSCULAR | Status: AC
  Filled 2016-10-06: qty 1

## 2016-10-06 MED ORDER — LIDOCAINE HCL (PF) 1 % IJ SOLN
5.0000 mL | Freq: Once | INTRAMUSCULAR | Status: AC
  Administered 2016-10-06: 5 mL via INTRADERMAL

## 2016-10-06 MED ORDER — FENTANYL CITRATE (PF) 100 MCG/2ML IJ SOLN
INTRAMUSCULAR | Status: DC | PRN
  Administered 2016-10-06: 50 ug via INTRAVENOUS
  Administered 2016-10-06: 200 ug via INTRAVENOUS

## 2016-10-06 MED ORDER — LIDOCAINE HCL (PF) 1 % IJ SOLN
INTRAMUSCULAR | Status: AC
  Administered 2016-10-06: 5 mL via INTRADERMAL
  Filled 2016-10-06: qty 5

## 2016-10-06 MED ORDER — MIDAZOLAM HCL 2 MG/2ML IJ SOLN
1.5000 mg | Freq: Once | INTRAMUSCULAR | Status: AC
  Administered 2016-10-06: 1.5 mg via INTRAVENOUS

## 2016-10-06 MED ORDER — CEFAZOLIN SODIUM-DEXTROSE 2-4 GM/100ML-% IV SOLN
INTRAVENOUS | Status: AC
  Filled 2016-10-06: qty 100

## 2016-10-06 MED ORDER — EPINEPHRINE PF 1 MG/ML IJ SOLN
INTRAMUSCULAR | Status: AC
  Filled 2016-10-06: qty 3

## 2016-10-06 MED ORDER — ROCURONIUM BROMIDE 100 MG/10ML IV SOLN
INTRAVENOUS | Status: DC | PRN
Start: 2016-10-06 — End: 2016-10-06
  Administered 2016-10-06: 10 mg via INTRAVENOUS
  Administered 2016-10-06: 50 mg via INTRAVENOUS
  Administered 2016-10-06: 10 mg via INTRAVENOUS

## 2016-10-06 MED ORDER — BUPIVACAINE-EPINEPHRINE (PF) 0.25% -1:200000 IJ SOLN
INTRAMUSCULAR | Status: AC
  Filled 2016-10-06: qty 30

## 2016-10-06 MED ORDER — CEFAZOLIN SODIUM-DEXTROSE 2-4 GM/100ML-% IV SOLN
2.0000 g | Freq: Once | INTRAVENOUS | Status: AC
  Administered 2016-10-06: 2 g via INTRAVENOUS

## 2016-10-06 MED ORDER — ROPIVACAINE HCL 5 MG/ML IJ SOLN
INTRAMUSCULAR | Status: AC
  Administered 2016-10-06: 30 mL via PERINEURAL
  Filled 2016-10-06: qty 20

## 2016-10-06 SURGICAL SUPPLY — 45 items
ANCHOR JUGGERKNOT WTAP NDL 2.9 (Anchor) ×10 IMPLANT
ANCHOR SUT QUATTRO KNTLS 4.5 (Anchor) ×6 IMPLANT
BIT DRILL JUGRKNT W/NDL BIT2.9 (DRILL) ×1 IMPLANT
BLADE FULL RADIUS 3.5 (BLADE) ×2 IMPLANT
BLADE SHAVER 4.5X7 STR FR (MISCELLANEOUS) IMPLANT
BUR ACROMIONIZER 4.0 (BURR) ×2 IMPLANT
BUR BR 5.5 WIDE MOUTH (BURR) IMPLANT
CANNULA SHAVER 8MMX76MM (CANNULA) ×2 IMPLANT
CHLORAPREP W/TINT 26ML (MISCELLANEOUS) ×4 IMPLANT
COVER MAYO STAND STRL (DRAPES) ×2 IMPLANT
DRAPE IMP U-DRAPE 54X76 (DRAPES) ×4 IMPLANT
DRILL JUGGERKNOT W/NDL BIT 2.9 (DRILL) ×2
DRSG OPSITE POSTOP 4X8 (GAUZE/BANDAGES/DRESSINGS) IMPLANT
ELECT REM PT RETURN 9FT ADLT (ELECTROSURGICAL) ×2
ELECTRODE REM PT RTRN 9FT ADLT (ELECTROSURGICAL) ×1 IMPLANT
GAUZE PETRO XEROFOAM 1X8 (MISCELLANEOUS) ×2 IMPLANT
GAUZE SPONGE 4X4 12PLY STRL (GAUZE/BANDAGES/DRESSINGS) ×2 IMPLANT
GLOVE BIO SURGEON STRL SZ7.5 (GLOVE) ×4 IMPLANT
GLOVE BIO SURGEON STRL SZ8 (GLOVE) ×4 IMPLANT
GLOVE BIOGEL PI IND STRL 8 (GLOVE) ×1 IMPLANT
GLOVE BIOGEL PI INDICATOR 8 (GLOVE) ×1
GLOVE INDICATOR 8.0 STRL GRN (GLOVE) ×2 IMPLANT
GOWN STRL REUS W/ TWL LRG LVL3 (GOWN DISPOSABLE) ×2 IMPLANT
GOWN STRL REUS W/ TWL XL LVL3 (GOWN DISPOSABLE) ×1 IMPLANT
GOWN STRL REUS W/TWL LRG LVL3 (GOWN DISPOSABLE) ×2
GOWN STRL REUS W/TWL XL LVL3 (GOWN DISPOSABLE) ×1
GRASPER SUT 15 45D LOW PRO (SUTURE) IMPLANT
IV LACTATED RINGER IRRG 3000ML (IV SOLUTION) ×2
IV LR IRRIG 3000ML ARTHROMATIC (IV SOLUTION) ×2 IMPLANT
MANIFOLD NEPTUNE II (INSTRUMENTS) ×2 IMPLANT
MASK FACE SPIDER DISP (MASK) ×2 IMPLANT
MAT BLUE FLOOR 46X72 FLO (MISCELLANEOUS) ×2 IMPLANT
NEEDLE REVERSE CUT 1/2 CRC (NEEDLE) IMPLANT
PACK ARTHROSCOPY SHOULDER (MISCELLANEOUS) ×2 IMPLANT
SLING ARM LRG DEEP (SOFTGOODS) IMPLANT
SLING ULTRA II LG (MISCELLANEOUS) ×2 IMPLANT
STAPLER SKIN PROX 35W (STAPLE) ×2 IMPLANT
STRAP SAFETY BODY (MISCELLANEOUS) ×2 IMPLANT
SUT ETHIBOND 0 MO6 C/R (SUTURE) ×4 IMPLANT
SUT VIC AB 2-0 CT1 27 (SUTURE) ×2
SUT VIC AB 2-0 CT1 TAPERPNT 27 (SUTURE) ×2 IMPLANT
TAPE MICROFOAM 4IN (TAPE) ×2 IMPLANT
TUBING ARTHRO INFLOW-ONLY STRL (TUBING) ×2 IMPLANT
TUBING CONNECTING 10 (TUBING) ×2 IMPLANT
WAND HAND CNTRL MULTIVAC 90 (MISCELLANEOUS) ×2 IMPLANT

## 2016-10-06 NOTE — Anesthesia Preprocedure Evaluation (Addendum)
Anesthesia Evaluation  Patient identified by MRN, date of birth, ID band Patient awake    Reviewed: Allergy & Precautions, H&P , NPO status , Patient's Chart, lab work & pertinent test results, reviewed documented beta blocker date and time   Airway Mallampati: II  TM Distance: >3 FB Neck ROM: full    Dental  (+) Edentulous Upper, Edentulous Lower   Pulmonary neg pulmonary ROS, former smoker,    Pulmonary exam normal        Cardiovascular negative cardio ROS Normal cardiovascular exam Rhythm:regular Rate:Normal     Neuro/Psych negative neurological ROS  negative psych ROS   GI/Hepatic negative GI ROS, Neg liver ROS, GERD  Medicated,  Endo/Other  negative endocrine ROSdiabetes  Renal/GU Renal diseasenegative Renal ROS  negative genitourinary   Musculoskeletal   Abdominal   Peds  Hematology negative hematology ROS (+) anemia ,   Anesthesia Other Findings Past Medical History: No date: Anemia No date: Diabetes mellitus without complication (HCC) No date: GERD (gastroesophageal reflux disease) No date: History of kidney stones No date: Kidney stones Past Surgical History: No date: APPENDECTOMY No date: EYE SURGERY     Comment: age 89 No date: KIDNEY STONE SURGERY No date: TONSILLECTOMY BMI    Body Mass Index:  24.67 kg/m     Reproductive/Obstetrics negative OB ROS                             Anesthesia Physical Anesthesia Plan  ASA: II  Anesthesia Plan: General ETT   Post-op Pain Management:  Regional for Post-op pain   Induction: Intravenous  Airway Management Planned:   Additional Equipment:   Intra-op Plan:   Post-operative Plan:   Informed Consent: I have reviewed the patients History and Physical, chart, labs and discussed the procedure including the risks, benefits and alternatives for the proposed anesthesia with the patient or authorized representative who has  indicated his/her understanding and acceptance.   Dental Advisory Given  Plan Discussed with: CRNA  Anesthesia Plan Comments:        Anesthesia Quick Evaluation

## 2016-10-06 NOTE — Discharge Instructions (Addendum)
Keep dressing dry and intact.  May shower after dressing changed on post-op day #4 (Monday).  Cover staples with Band-Aids after drying off. Apply ice frequently to shoulder. Take ibuprofen 600 mg TID with meals for 7-10 days, then as necessary. Take oxycodone as prescribed when needed.  May supplement with ES Tylenol if necessary. Keep shoulder immobilizer on at all times except may remove for bathing purposes. Follow-up in 10-14 days or as scheduled.  AMBULATORY SURGERY  DISCHARGE INSTRUCTIONS   1) The drugs that you were given will stay in your system until tomorrow so for the next 24 hours you should not:  A) Drive an automobile B) Make any legal decisions C) Drink any alcoholic beverage   2) You may resume regular meals tomorrow.  Today it is better to start with liquids and gradually work up to solid foods.  You may eat anything you prefer, but it is better to start with liquids, then soup and crackers, and gradually work up to solid foods.   3) Please notify your doctor immediately if you have any unusual bleeding, trouble breathing, redness and pain at the surgery site, drainage, fever, or pain not relieved by medication.    4) Additional Instructions: TAKE A STOOL SOFTENER TWICE A DAY WHILE TAKING NARCOTIC PAIN MEDICINE TO PREVENT CONSTIPATION   Please contact your physician with any problems or Same Day Surgery at [phone], Monday through Friday 6 am to 4 pm, or [place] [facility] Main number at [phone].

## 2016-10-06 NOTE — Anesthesia Procedure Notes (Signed)
Procedure Name: Intubation Date/Time: 10/06/2016 12:51 PM Performed by: Rosaria Ferries, Rozalia Dino Pre-anesthesia Checklist: Patient identified, Emergency Drugs available, Suction available and Patient being monitored Patient Re-evaluated:Patient Re-evaluated prior to inductionOxygen Delivery Method: Circle system utilized Preoxygenation: Pre-oxygenation with 100% oxygen Intubation Type: IV induction Laryngoscope Size: Mac and 3 Grade View: Grade I Tube size: 7.0 mm Number of attempts: 1 Airway Equipment and Method: LTA kit utilized Placement Confirmation: ETT inserted through vocal cords under direct vision,  positive ETCO2 and breath sounds checked- equal and bilateral Secured at: 23 cm Tube secured with: Tape Dental Injury: Teeth and Oropharynx as per pre-operative assessment

## 2016-10-06 NOTE — Op Note (Signed)
10/06/2016  2:41 PM  Patient:   Alex Soto.  Pre-Op Diagnosis:   Massive rotator cuff tear, right shoulder.   Postoperative diagnosis: Impingement/tendinopathy with massive rotator cuff tear and biceps tendinopathy, right shoulder.  Procedure: Limited arthroscopic debridement, arthroscopic subacromial decompression, mini-open rotator cuff repair, and mini-open biceps tenodesis, right shoulder.  Anesthesia: General endotracheal with interscalene block placed preoperatively by the anesthesiologist.  Surgeon:   Maryagnes AmosJ. Jeffrey Iyahna Obriant, MD  Assistant:   Horris LatinoLance McGhee, PA-C  Findings: As above. There was a massive tear of the rotator cuff involving the entire insertions of the supraspinatus and infraspinatus tendons. The subscapularis and teres minor tendons were intact. There were moderate tendinopathic changes of the biceps tendon. The labrum was in satisfactory condition, as were the articular surfaces of the glenoid and humerus.  Complications: None  Fluids:   900 cc  Estimated blood loss: 10 cc  Tourniquet time: None  Drains: None  Closure: Staples   Brief clinical note: The patient is a 70 year old male who sustained a right shoulder injury in a fall approximate 6 weeks ago. The patient's symptoms of pain and weakness have persisted despite medications, activity modification, etc. The patient's history and examination are consistent with a massive rotator cuff tear. These findings were confirmed by MRI scan. The patient presents at this time for definitive management of these shoulder symptoms.  Procedure: The patient underwent placement of an interscalene block by the anesthesiologist in the preoperative holding area before he was brought into the operating room and lain in the supine position. The patient then underwent general endotracheal intubation and anesthesia before being repositioned in the beach chair position using the beach chair positioner. The  right shoulder and upper extremity were prepped with ChloraPrep solution before being draped sterilely. Preoperative antibiotics were administered. A timeout was performed to confirm the proper surgical site before the expected portal sites and incision site were injected with 0.25% Sensorcaine with epinephrine. A posterior portal was created and the glenohumeral joint thoroughly inspected with the findings as described above. An anterior portal was created using an outside-in technique. The labrum and rotator cuff were further probed, again confirming the above-noted findings. Areas of synovitis and labral fraying were debrided back to stable margins using the full-radius resector. The biceps tendon was noted to be frayed so the ArthroCare wand was inserted and used to release the tendon from its labral attachment. It also was used to obtain hemostasis as well as to "anneal" the labrum superiorly and anteriorly. The instruments were removed from the joint after suctioning the excess fluid.  The camera was repositioned through the posterior portal into the subacromial space. A separate lateral portal was created using an outside-in technique. The 3.5 mm full-radius resector was introduced and used to perform a subtotal bursectomy. The ArthroCare wand was then inserted and used to remove the periosteal tissue off the undersurface of the anterior third of the acromion as well as to recess the coracoacromial ligament from its attachment along the anterior and lateral margins of the acromion. The 4.0 mm acromionizing bur was introduced and used to complete the decompression by removing the undersurface of the anterior third of the acromion. The full radius resector was reintroduced to remove any residual bony debris before the ArthroCare wand was reintroduced to obtain hemostasis. The instruments were then removed from the subacromial space after suctioning the excess fluid.  An approximately 4-5 cm incision was  made over the anterolateral aspect of the shoulder beginning at the  anterolateral corner of the acromion and extending distally in line with the bicipital groove. This incision was carried down through the subcutaneous tissues to expose the deltoid fascia. The raphae between the anterior and middle thirds was identified and this plane developed to provide access into the subacromial space. Additional bursal tissues were debrided sharply using Metzenbaum scissors. The rotator cuff tear was readily identified. The margins were debrided sharply with a #15 blade and the exposed greater tuberosity roughened with a rongeur. The tear was repaired using four Biomet 2.9 mm JuggerKnot anchors. These sutures were then brought back laterally and secured using three [place] QuatroLink anchors to create a two-layer closure. An apparent watertight closure was obtained.  The bicipital groove was identified by palpation and opened for 1-1.5 cm. The biceps tendon stump was retrieved through this defect. The floor of the bicipital groove was roughened with a curet before another Biomet 2.9 mm JuggerKnot anchor was inserted. Both sets of sutures were passed through the biceps tendon and tied securely to effect the tenodesis. The bicipital sheath was reapproximated using two #0 Ethibond interrupted sutures, incorporating the biceps tendon to further reinforce the tenodesis.  The wound was copiously irrigated with sterile saline solution before the deltoid raphae was reapproximated using 2-0 Vicryl interrupted sutures. The subcutaneous tissues were closed in two layers using 2-0 Vicryl interrupted sutures before the skin was closed using staples. The portal sites also were closed using staples. A sterile bulky dressing was applied to the shoulder before the arm was placed into a shoulder immobilizer. The patient was then awakened, extubated, and returned to the recovery room in satisfactory condition after tolerating the procedure  well.

## 2016-10-06 NOTE — H&P (Signed)
Paper H&P to be scanned into permanent record. H&P reviewed. No changes. 

## 2016-10-06 NOTE — Transfer of Care (Signed)
Immediate Anesthesia Transfer of Care Note  Patient: Alex SilversmithLarry W Doty Sr.  Procedure(s) Performed: Procedure(s): SHOULDER ARTHROSCOPY WITH OPEN ROTATOR CUFF REPAIR (Right) SHOULDER ARTHROSCOPY WITH SUBACROMIAL DECOMPRESSION AND DEBRIDEMENT (Right) SHOULDER ARTHROSCOPY WITH BICEPS TENDON REPAIR (Right)  Patient Location: PACU  Anesthesia Type:General  Level of Consciousness: awake and patient cooperative  Airway & Oxygen Therapy: Patient Spontanous Breathing and Patient connected to face mask oxygen  Post-op Assessment: Report given to RN and Post -op Vital signs reviewed and stable  Post vital signs: Reviewed and stable  Last Vitals:  Vitals:   10/06/16 1235 10/06/16 1500  BP: (!) 155/72 157/81  Pulse: (!) 59 69  Resp: (!) 23 12  Temp:  (P) 36.8 C    Last Pain:  Vitals:   10/06/16 1050  TempSrc: Oral  PainSc: 2          Complications: No apparent anesthesia complications

## 2016-10-06 NOTE — Anesthesia Procedure Notes (Signed)
Anesthesia Regional Block:  Interscalene brachial plexus block  Pre-Anesthetic Checklist: ,, timeout performed, Correct Patient, Correct Site, Correct Laterality, Correct Procedure, Correct Position, site marked, Risks and benefits discussed,  Surgical consent,  Pre-op evaluation,  At surgeon's request and post-op pain management  Laterality: Right  Prep: chloraprep       Needles:  Injection technique: Single-shot  Needle Type: Echogenic Stimulator Needle     Needle Length: 10cm 10 cm Needle Gauge: 22 and 22 G    Additional Needles:  Procedures: ultrasound guided (picture in chart) and nerve stimulator Interscalene brachial plexus block  Nerve Stimulator or Paresthesia:  Response: biceps flexion,   Additional Responses:   Narrative:  Injection made incrementally with aspirations every 5 mL.  Performed by: Personally   Additional Notes: Functioning IV was confirmed and monitors were applied.  A 22ga Stimuplex echogenic needle was used. Sterile prep and drape,hand hygiene and sterile gloves were used.  Negative aspiration and negative test dose prior to incremental administration of local anesthetic. The patient tolerated the procedure well.

## 2016-10-07 ENCOUNTER — Encounter: Payer: Self-pay | Admitting: Surgery

## 2016-10-07 NOTE — Anesthesia Postprocedure Evaluation (Signed)
Anesthesia Post Note  Patient: [name]  Procedure(s) Performed: Procedure(s) (LRB): SHOULDER ARTHROSCOPY WITH OPEN ROTATOR CUFF REPAIR (Right) SHOULDER ARTHROSCOPY WITH SUBACROMIAL DECOMPRESSION AND DEBRIDEMENT (Right) SHOULDER ARTHROSCOPY WITH BICEPS TENDON REPAIR (Right)  Patient location during evaluation: PACU Anesthesia Type: General and Regional Level of consciousness: awake and alert Pain management: pain level controlled Vital Signs Assessment: post-procedure vital signs reviewed and stable Respiratory status: spontaneous breathing, nonlabored ventilation, respiratory function stable and patient connected to nasal cannula oxygen Cardiovascular status: blood pressure returned to baseline and stable Postop Assessment: no signs of nausea or vomiting Anesthetic complications: no    Last Vitals:  Vitals:   10/06/16 1606 10/06/16 1629  BP: (!) 167/75 (!) 177/75  Pulse: 65 67  Resp: 16 16  Temp: 36.6 C     Last Pain:  Vitals:   10/07/16 1139  TempSrc:   PainSc: 0-No pain                 Lenard SimmerAndrew Abiola Behring

## 2016-10-10 DIAGNOSIS — M7521 Bicipital tendinitis, right shoulder: Secondary | ICD-10-CM | POA: Insufficient documentation

## 2017-07-08 ENCOUNTER — Emergency Department: Payer: Medicare Other

## 2017-07-08 ENCOUNTER — Emergency Department
Admission: EM | Admit: 2017-07-08 | Discharge: 2017-07-08 | Disposition: A | Discharge status: Home / Self Care | Payer: Medicare Other | Attending: Emergency Medicine | Admitting: Emergency Medicine

## 2017-07-08 DIAGNOSIS — Z79899 Other long term (current) drug therapy: Secondary | ICD-10-CM | POA: Insufficient documentation

## 2017-07-08 DIAGNOSIS — E871 Hypo-osmolality and hyponatremia: Secondary | ICD-10-CM

## 2017-07-08 DIAGNOSIS — R509 Fever, unspecified: Secondary | ICD-10-CM | POA: Diagnosis not present

## 2017-07-08 DIAGNOSIS — E119 Type 2 diabetes mellitus without complications: Secondary | ICD-10-CM | POA: Insufficient documentation

## 2017-07-08 DIAGNOSIS — Z87891 Personal history of nicotine dependence: Secondary | ICD-10-CM | POA: Diagnosis not present

## 2017-07-08 DIAGNOSIS — R1110 Vomiting, unspecified: Secondary | ICD-10-CM | POA: Diagnosis present

## 2017-07-08 LAB — COMPREHENSIVE METABOLIC PANEL
ALBUMIN: 4.7 g/dL (ref 3.5–5.0)
ALK PHOS: 66 U/L (ref 38–126)
ALT: 12 U/L — ABNORMAL LOW (ref 17–63)
ANION GAP: 8 (ref 5–15)
AST: 16 U/L (ref 15–41)
BUN: 18 mg/dL (ref 6–20)
CALCIUM: 9.1 mg/dL (ref 8.9–10.3)
CO2: 25 mmol/L (ref 22–32)
Chloride: 97 mmol/L — ABNORMAL LOW (ref 101–111)
Creatinine, Ser: 1.09 mg/dL (ref 0.61–1.24)
GFR calc Af Amer: >60 mL/min (ref >60)
GFR calc non Af Amer: >60 mL/min (ref >60)
GLUCOSE: 164 mg/dL — AB (ref 65–99)
Potassium: 4 mmol/L (ref 3.5–5.1)
SODIUM: 130 mmol/L — AB (ref 135–145)
Total Bilirubin: 1.1 mg/dL (ref 0.3–1.2)
Total Protein: 7.9 g/dL (ref 6.5–8.1)

## 2017-07-08 LAB — CBC
HEMATOCRIT: 35.9 % — AB (ref 40.0–52.0)
HEMOGLOBIN: 12.6 g/dL — AB (ref 13.0–18.0)
MCH: 30.8 pg (ref 26.0–34.0)
MCHC: 35.2 g/dL (ref 32.0–36.0)
MCV: 87.6 fL (ref 80.0–100.0)
Platelets: 222 10*3/uL (ref 150–440)
RBC: 4.1 MIL/uL — ABNORMAL LOW (ref 4.40–5.90)
RDW: 14 % (ref 11.5–14.5)
WBC: 6.5 10*3/uL (ref 3.8–10.6)

## 2017-07-08 LAB — URINALYSIS, COMPLETE (UACMP) WITH MICROSCOPIC
BACTERIA UA: NONE SEEN
Bilirubin Urine: NEGATIVE
Glucose, UA: 50 mg/dL — AB
Hgb urine dipstick: NEGATIVE
Ketones, ur: NEGATIVE mg/dL
Leukocytes, UA: NEGATIVE
Nitrite: NEGATIVE
PROTEIN: 100 mg/dL — AB
Specific Gravity, Urine: 1.021 (ref 1.005–1.030)
pH: 6 (ref 5.0–8.0)

## 2017-07-08 LAB — GLUCOSE, CAPILLARY: GLUCOSE-CAPILLARY: 101 mg/dL — AB (ref 65–99)

## 2017-07-08 LAB — LIPASE, BLOOD: Lipase: 18 U/L (ref 11–51)

## 2017-07-08 MED ORDER — ACETAMINOPHEN 500 MG PO TABS
1000.0000 mg | ORAL_TABLET | Freq: Once | ORAL | Status: AC
  Administered 2017-07-08: 1000 mg via ORAL
  Filled 2017-07-08: qty 2

## 2017-07-08 MED ORDER — ONDANSETRON HCL 4 MG PO TABS: 4.0000 mg | ORAL_TABLET | Freq: Three times a day (TID) | ORAL | 0 refills | Status: DC | PRN

## 2017-07-08 MED ORDER — SODIUM CHLORIDE 0.9 % IV BOLUS (SEPSIS)
1000.0000 mL | Freq: Once | INTRAVENOUS | Status: AC
  Administered 2017-07-08: 1000 mL via INTRAVENOUS

## 2017-07-08 MED ORDER — IBUPROFEN 600 MG PO TABS
600.0000 mg | ORAL_TABLET | Freq: Once | ORAL | Status: AC
  Administered 2017-07-08: 600 mg via ORAL
  Filled 2017-07-08: qty 1

## 2017-07-08 MED ORDER — ONDANSETRON 4 MG PO TBDP
4.0000 mg | ORAL_TABLET | Freq: Once | ORAL | Status: AC | PRN
  Administered 2017-07-08: 4 mg via ORAL
  Filled 2017-07-08: qty 1

## 2017-07-08 NOTE — ED Notes (Signed)
NAD noted at time of D/C. Pt denies questions or concerns. Pt ambulatory to the lobby at this time.  

## 2017-07-08 NOTE — ED Provider Notes (Signed)
**Note Alex-Identified via Obfuscation** [facility] Emergency Department Provider Note ____________________________________________   I have reviewed the triage vital signs and the triage nursing note.  HISTORY  Chief Complaint Emesis and Fever   Historian Patient  HPI Alex BurrsLarry W Payes Sr. is a 71 y.o. male With a history of diabetes, and appendectomy, presents stating that he had a poor appetite last evening although during the day he was mowing outside and felt fine, and this morning had several episodes of nonbloody and nonbilious emesis overnight. He also developed a fever.  Denies abdominal pain. Denies diarrhea. Denies bad food. Denies sick contacts. Denies coughing or trouble breathing or shortness of breath.    Past Medical History:  Diagnosis Date  . Anemia   . Diabetes mellitus without complication (HCC)   . GERD (gastroesophageal reflux disease)   . History of kidney stones   . Kidney stones     There are no active problems to display for this patient.   Past Surgical History:  Procedure Laterality Date  . APPENDECTOMY    . EYE SURGERY     age 89  . KIDNEY STONE SURGERY    . SHOULDER ARTHROSCOPY WITH BICEPS TENDON REPAIR Right 10/06/2016   Procedure: SHOULDER ARTHROSCOPY WITH BICEPS TENDON REPAIR;  Surgeon: Christena FlakeJohn J Poggi, MD;  Location: [facility] ORS;  Service: Orthopedics;  Laterality: Right;  . SHOULDER ARTHROSCOPY WITH OPEN ROTATOR CUFF REPAIR Right 10/06/2016   Procedure: SHOULDER ARTHROSCOPY WITH OPEN ROTATOR CUFF REPAIR;  Surgeon: Christena FlakeJohn J Poggi, MD;  Location: [facility] ORS;  Service: Orthopedics;  Laterality: Right;  . SHOULDER ARTHROSCOPY WITH SUBACROMIAL DECOMPRESSION Right 10/06/2016   Procedure: SHOULDER ARTHROSCOPY WITH SUBACROMIAL DECOMPRESSION AND DEBRIDEMENT;  Surgeon: Christena FlakeJohn J Poggi, MD;  Location: [facility] ORS;  Service: Orthopedics;  Laterality: Right;  . TONSILLECTOMY      Prior to Admission medications   Medication Sig Start Date End Date Taking? Authorizing Provider  aspirin 81  MG tablet Take 81 mg by mouth daily.    [provider]  fluticasone (FLONASE) 50 MCG/ACT nasal spray Place 1 spray into both nostrils daily as needed for allergies or rhinitis.    [provider]  glyBURIDE-metformin (GLUCOVANCE) 2.5-500 MG tablet Take 1 tablet by mouth daily with breakfast.    [provider]  lisinopril (PRINIVIL,ZESTRIL) 20 MG tablet Take 20 mg by mouth at bedtime.     [provider]  omeprazole (PRILOSEC) 20 MG capsule Take 20 mg by mouth as needed.    [provider]  ondansetron (ZOFRAN) 4 MG tablet Take 1 tablet (4 mg total) by mouth every 8 (eight) hours as needed for nausea or vomiting. 07/08/17   Governor RooksLord, Davonte Siebenaler, MD  oxyCODONE (ROXICODONE) 5 MG immediate release tablet Take 1-2 tablets (5-10 mg total) by mouth every 4 (four) hours as needed for severe pain. 10/06/16   Poggi, Excell SeltzerJohn J, MD  pioglitazone (ACTOS) 15 MG tablet Take 15 mg by mouth daily.    [provider]  pravastatin (PRAVACHOL) 80 MG tablet Take 80 mg by mouth every evening.    [provider]    No Known Allergies  No family history on file.  Social History Social History  Substance Use Topics  . Smoking status: Former Smoker    Packs/day: 0.25    Years: 10.00    Types: Cigarettes    Quit date: 09/30/1969  . Smokeless tobacco: Never Used  . Alcohol use No    Review of Systems  Constitutional: positivefor fever. Eyes: Negative for  visual changes. ENT: Negative for sore throat. Cardiovascular: Negative for chest pain. Respiratory: Negative for shortness of breath. Gastrointestinal: Negative for abdominal pain, or diarrhea. Genitourinary: Negative for dysuria. Musculoskeletal: Negative for back pain. Skin: Negative for rash. Neurological: Negative for headache.  ____________________________________________   PHYSICAL EXAM:  VITAL SIGNS: ED Triage Vitals [07/08/17 1008]  Enc Vitals Group     BP 125/71     Pulse Rate 82      Resp 18     Temp (!) 100.7 F (38.2 C)     Temp Source Oral     SpO2 94 %     Weight 197 lb (89.4 kg)     Height  (1.854 m)     Head Circumference      Peak Flow      Pain Score      Pain Loc      Pain Edu?      Excl. in GC?      Constitutional: Alert and oriented. Well appearing and in no distress. HEENT   Head: Normocephalic and atraumatic.      Eyes: Conjunctivae are normal. Pupils equal and round.       Ears:         Nose: No congestion/rhinnorhea.   Mouth/Throat: Mucous membranes are moist.   Neck: No stridor. Cardiovascular/Chest: Normal rate, regular rhythm.  No murmurs, rubs, or gallops. Respiratory: Normal respiratory effort without tachypnea nor retractions. Breath sounds are clear and equal bilaterally. No wheezes/rales/rhonchi. Gastrointestinal: Soft. No distention, no guarding, no rebound. Nontender.    Genitourinary/rectal:Deferred Musculoskeletal: Nontender with normal range of motion in all extremities. No joint effusions.  No lower extremity tenderness.  No edema. Neurologic:  Normal speech and language. No gross or focal neurologic deficits are appreciated. Skin:  Skin is warm, dry and intact. No rash noted. Psychiatric: Mood and affect are normal. Speech and behavior are normal. Patient exhibits appropriate insight and judgment.   ____________________________________________  LABS (pertinent positives/negatives)  Labs Reviewed  COMPREHENSIVE METABOLIC PANEL - Abnormal; Notable for the following:       Result Value   Sodium 130 (*)    Chloride 97 (*)    Glucose, Bld 164 (*)    ALT 12 (*)    All other components within normal limits  CBC - Abnormal; Notable for the following:    RBC 4.10 (*)    Hemoglobin 12.6 (*)    HCT 35.9 (*)    All other components within normal limits  URINALYSIS, COMPLETE (UACMP) WITH MICROSCOPIC - Abnormal; Notable for the following:    Color, Urine YELLOW (*)    APPearance CLEAR (*)    Glucose, UA 50 (*)     Protein, ur 100 (*)    Squamous Epithelial / LPF 0-5 (*)    All other components within normal limits  GLUCOSE, CAPILLARY - Abnormal; Notable for the following:    Glucose-Capillary 101 (*)    All other components within normal limits  LIPASE, BLOOD  CBG MONITORING, ED    ____________________________________________    EKG I, Governor Rooks, MD, the attending physician have personally viewed and interpreted all ECGs.  none ____________________________________________  RADIOLOGY All Xrays were viewed by me. Imaging interpreted by Radiologist.  chest and abdomen x-ray:  IMPRESSION: Negative abdominal radiographs.  No acute cardiopulmonary disease. __________________________________________  PROCEDURES  Procedure(s) performed: None  Critical Care performed: None  ____________________________________________   ED COURSE / ASSESSMENT AND PLAN  Pertinent labs & imaging results that were  available during my care of the patient were reviewed by me and considered in my medical decision making (see chart for details).   Mr. Millea is overall well-appearing and presents with fever to 102 here in the emergency department. He'll be given Tylenol. He states that his nausea is under better control after receiving Zofran. On laboratory evaluation his white blood cell count is normal. He has had a slight hyponatremia. I am going to give him IV fluids here. On exam he does not have any significant abdominal pain. I am awaiting urine sample.  I am most suspicious of likely a stomach virus given the fever and vomiting without pain or any other associated symptoms. Patient's symptomatically improved and I think is okay for outpatient management and close follow-up.  Chest and abdomen x-ray is reassuring. Richard decreased after Tylenol, but still 101. Patient was given a dose of ibuprofen.  Awaiting UA, although doubt this is the likely source.  I'm most suspicious for GI virus.  UA was  reassuring. Abdomen continues to be nontender and I'm not suspicious for Colitis at this point in time. No right upper quadrant tenderness. I'm not concerned for cholecystitis at this point time.    CONSULTATIONS:   None  Patient / Family / Caregiver informed of clinical course, medical decision-making process, and agree with plan.   I discussed return precautions, follow-up instructions, and discharge instructions with patient and/or family.  Discharge Instructions : You were evaluated for fever and vomiting, and although no certain cause was found, I examined evaluation are overall reassuring in the emergency department stay.  Return to the emergency room immediately for any worsening symptoms including tingling blood, black or bloody stool, dizziness or passing out, any altered mental status, abdominal pain, chest pain, or any other symptoms concerning to you.  ___________________________________________   FINAL CLINICAL IMPRESSION(S) / ED DIAGNOSES   Final diagnoses:  Hyponatremia  Fever, unspecified fever cause              Note: This dictation was prepared with Dragon dictation. Any transcriptional errors that result from this process are unintentional    Governor Rooks, MD 07/08/17 1406

## 2017-07-08 NOTE — ED Triage Notes (Signed)
Pt c/o fever and vomiting since last night. Pt mowed two yards yesterday and felt great, started feeling sick last night. No tylenol or ibuprofen PTA. Pt alert and oriented X4, active, cooperative, pt in NAD. RR even and unlabored, color WNL.

## 2017-07-08 NOTE — Discharge Instructions (Signed)
you were evaluated for fever and vomiting, and although no certain cause was found, I examined evaluation are overall reassuring in the emergency department stay.  Return to the emergency room immediately for any worsening symptoms including tingling blood, black or bloody stool, dizziness or passing out, any altered mental status, abdominal pain, chest pain, or any other symptoms concerning to you.

## 2017-07-08 NOTE — ED Notes (Signed)
Pt taken for Xray

## 2017-07-15 ENCOUNTER — Encounter: Payer: Self-pay | Admitting: Emergency Medicine

## 2017-07-15 ENCOUNTER — Emergency Department: Payer: Medicare Other

## 2017-07-15 ENCOUNTER — Emergency Department
Admission: EM | Admit: 2017-07-15 | Discharge: 2017-07-15 | Disposition: A | Discharge status: Home / Self Care | Payer: Medicare Other | Attending: Emergency Medicine | Admitting: Emergency Medicine

## 2017-07-15 DIAGNOSIS — R1110 Vomiting, unspecified: Secondary | ICD-10-CM | POA: Insufficient documentation

## 2017-07-15 DIAGNOSIS — Z87891 Personal history of nicotine dependence: Secondary | ICD-10-CM | POA: Insufficient documentation

## 2017-07-15 DIAGNOSIS — Z7982 Long term (current) use of aspirin: Secondary | ICD-10-CM | POA: Insufficient documentation

## 2017-07-15 DIAGNOSIS — Z79899 Other long term (current) drug therapy: Secondary | ICD-10-CM | POA: Diagnosis not present

## 2017-07-15 DIAGNOSIS — Z7984 Long term (current) use of oral hypoglycemic drugs: Secondary | ICD-10-CM | POA: Diagnosis not present

## 2017-07-15 DIAGNOSIS — E119 Type 2 diabetes mellitus without complications: Secondary | ICD-10-CM | POA: Diagnosis not present

## 2017-07-15 DIAGNOSIS — R112 Nausea with vomiting, unspecified: Secondary | ICD-10-CM

## 2017-07-15 LAB — TSH: TSH: 1.42 u[IU]/mL (ref 0.350–4.500)

## 2017-07-15 LAB — COMPREHENSIVE METABOLIC PANEL
ALK PHOS: 85 U/L (ref 38–126)
ALT: 12 U/L — AB (ref 17–63)
AST: 19 U/L (ref 15–41)
Albumin: 4.4 g/dL (ref 3.5–5.0)
Anion gap: 10 (ref 5–15)
BILIRUBIN TOTAL: 0.9 mg/dL (ref 0.3–1.2)
BUN: 21 mg/dL — AB (ref 6–20)
CALCIUM: 9.7 mg/dL (ref 8.9–10.3)
CO2: 25 mmol/L (ref 22–32)
CREATININE: 0.93 mg/dL (ref 0.61–1.24)
Chloride: 97 mmol/L — ABNORMAL LOW (ref 101–111)
GFR calc Af Amer: >60 mL/min (ref >60)
Glucose, Bld: 184 mg/dL — ABNORMAL HIGH (ref 65–99)
Potassium: 4.8 mmol/L (ref 3.5–5.1)
Sodium: 132 mmol/L — ABNORMAL LOW (ref 135–145)
Total Protein: 8.5 g/dL — ABNORMAL HIGH (ref 6.5–8.1)

## 2017-07-15 LAB — CBC
HEMATOCRIT: 40.6 % (ref 40.0–52.0)
Hemoglobin: 14 g/dL (ref 13.0–18.0)
MCH: 30.4 pg (ref 26.0–34.0)
MCHC: 34.5 g/dL (ref 32.0–36.0)
MCV: 88.1 fL (ref 80.0–100.0)
Platelets: 248 10*3/uL (ref 150–440)
RBC: 4.61 MIL/uL (ref 4.40–5.90)
RDW: 13.8 % (ref 11.5–14.5)
WBC: 5.7 10*3/uL (ref 3.8–10.6)

## 2017-07-15 LAB — LIPASE, BLOOD: Lipase: 21 U/L (ref 11–51)

## 2017-07-15 LAB — TROPONIN I: Troponin I: <0.03 ng/mL (ref <0.03)

## 2017-07-15 MED ORDER — SODIUM CHLORIDE 0.9 % IV BOLUS (SEPSIS)
1000.0000 mL | Freq: Once | INTRAVENOUS | Status: AC
  Administered 2017-07-15: 1000 mL via INTRAVENOUS

## 2017-07-15 MED ORDER — ONDANSETRON HCL 4 MG PO TABS
4.0000 mg | ORAL_TABLET | Freq: Once | ORAL | Status: AC
  Administered 2017-07-15: 4 mg via ORAL
  Filled 2017-07-15: qty 1

## 2017-07-15 MED ORDER — ONDANSETRON HCL 4 MG PO TABS: 4.0000 mg | ORAL_TABLET | Freq: Every day | ORAL | 0 refills | Status: DC | PRN

## 2017-07-15 NOTE — ED Provider Notes (Signed)
[facility] Emergency Department Provider Note  ____________________________________________   First MD Initiated Contact with Patient 07/15/17 1024     (approximate)  I have reviewed the triage vital signs and the nursing notes.   HISTORY  Chief Complaint Emesis   HPI Alex Soto. is a 71 y.o. male with a history of diabetes, GERD and anemia who is presenting to the emergency department today with vomiting over the past week. He has been seen once in the emergency department and found to be slightly hyponatremic. He was then evaluated as an outpatient and given medication for "tick fever." He is currently on his fourth day of doxycycline. However, he continues to vomit, 2-3 times per day. He is passing gas but is not having any diarrhea. Says that he is also not had a bowel movement in several days but is unable to "keep anything down." He is denying any pain. Had a fever on September 8 but has not had any fever since. Subsequent lab studies have shown a positive IgG for [facility] spotted fever but not a positive IgM. Patient also does not recall any recent tick bites.   Past Medical History:  Diagnosis Date  . Anemia   . Diabetes mellitus without complication (HCC)   . GERD (gastroesophageal reflux disease)   . History of kidney stones   . Kidney stones     There are no active problems to display for this patient.   Past Surgical History:  Procedure Laterality Date  . APPENDECTOMY    . EYE SURGERY     age 60  . KIDNEY STONE SURGERY    . SHOULDER ARTHROSCOPY WITH BICEPS TENDON REPAIR Right 10/06/2016   Procedure: SHOULDER ARTHROSCOPY WITH BICEPS TENDON REPAIR;  Surgeon: Christena Flake, MD;  Location: [facility] ORS;  Service: Orthopedics;  Laterality: Right;  . SHOULDER ARTHROSCOPY WITH OPEN ROTATOR CUFF REPAIR Right 10/06/2016   Procedure: SHOULDER ARTHROSCOPY WITH OPEN ROTATOR CUFF REPAIR;  Surgeon: Christena Flake, MD;  Location: [facility] ORS;  Service:  Orthopedics;  Laterality: Right;  . SHOULDER ARTHROSCOPY WITH SUBACROMIAL DECOMPRESSION Right 10/06/2016   Procedure: SHOULDER ARTHROSCOPY WITH SUBACROMIAL DECOMPRESSION AND DEBRIDEMENT;  Surgeon: Christena Flake, MD;  Location: [facility] ORS;  Service: Orthopedics;  Laterality: Right;  . TONSILLECTOMY      Prior to Admission medications   Medication Sig Start Date End Date Taking? Authorizing Provider  aspirin 81 MG tablet Take 81 mg by mouth daily.    [provider]  fluticasone (FLONASE) 50 MCG/ACT nasal spray Place 1 spray into both nostrils daily as needed for allergies or rhinitis.    [provider]  glyBURIDE-metformin (GLUCOVANCE) 2.5-500 MG tablet Take 1 tablet by mouth daily with breakfast.    [provider]  lisinopril (PRINIVIL,ZESTRIL) 20 MG tablet Take 20 mg by mouth at bedtime.     [provider]  omeprazole (PRILOSEC) 20 MG capsule Take 20 mg by mouth as needed.    [provider]  ondansetron (ZOFRAN) 4 MG tablet Take 1 tablet (4 mg total) by mouth every 8 (eight) hours as needed for nausea or vomiting. 07/08/17   Governor Rooks, MD  oxyCODONE (ROXICODONE) 5 MG immediate release tablet Take 1-2 tablets (5-10 mg total) by mouth every 4 (four) hours as needed for severe pain. 10/06/16   Poggi, Excell Seltzer, MD  pioglitazone (ACTOS) 15 MG tablet Take 15 mg by mouth daily.    [provider]  pravastatin (PRAVACHOL) 80  MG tablet Take 80 mg by mouth every evening.    [provider]    Allergies Patient has no known allergies.  No family history on file.  Social History Social History  Substance Use Topics  . Smoking status: Former Smoker    Packs/day: 0.25    Years: 10.00    Types: Cigarettes    Quit date: 09/30/1969  . Smokeless tobacco: Never Used  . Alcohol use No    Review of Systems  Constitutional: No fever/chills Eyes: No visual changes. ENT: No sore throat. Cardiovascular: Denies chest pain. Respiratory:  Denies shortness of breath. Gastrointestinal: No abdominal pain.  No diarrhea.  No constipation. Genitourinary: Negative for dysuria. Musculoskeletal: Negative for back pain. Skin: Negative for rash. Neurological: Negative for headaches, focal weakness or numbness.   ____________________________________________   PHYSICAL EXAM:  VITAL SIGNS: ED Triage Vitals  Enc Vitals Group     BP 07/15/17 1011 (!) 154/71     Pulse Rate 07/15/17 1009 69     Resp 07/15/17 1009 16     Temp 07/15/17 1009 97.8 F (36.6 C)     Temp Source 07/15/17 1009 Oral     SpO2 07/15/17 1009 96 %     Weight 07/15/17 1009 197 lb (89.4 kg)     Height --      Head Circumference --      Peak Flow --      Pain Score --      Pain Loc --      Pain Edu? --      Excl. in GC? --     Constitutional: Alert and oriented. Well appearing and in no acute distress. Eyes: Conjunctivae are normal.  Head: Atraumatic. Nose: No congestion/rhinnorhea. Mouth/Throat: Mucous membranes are moist.  Neck: No stridor.   Cardiovascular: Normal rate, regular rhythm. Grossly normal heart sounds.   Respiratory: Normal respiratory effort.  No retractions. Lungs CTAB. Gastrointestinal: Soft and nontender. No distention. Musculoskeletal: No lower extremity tenderness nor edema.  No joint effusions. Neurologic:  Normal speech and language. No gross focal neurologic deficits are appreciated. Skin:  Skin is warm, dry and intact. No rash noted. Psychiatric: Mood and affect are normal. Speech and behavior are normal.  ____________________________________________   LABS (all labs ordered are listed, but only abnormal results are displayed)  Labs Reviewed  COMPREHENSIVE METABOLIC PANEL - Abnormal; Notable for the following:       Result Value   Sodium 132 (*)    Chloride 97 (*)    Glucose, Bld 184 (*)    BUN 21 (*)    Total Protein 8.5 (*)    ALT 12 (*)    All other components within normal limits  LIPASE, BLOOD  CBC  TSH    TROPONIN I   ____________________________________________  EKG  ED ECG REPORT I, Arelia Longest, the attending physician, personally viewed and interpreted this ECG.   Date: 07/15/2017  EKG Time: 1125  Rate: 61  Rhythm: normal sinus rhythm  Axis: normal  Intervals:none  ST&T Change: no ST segment elevation or depression. No abnormal T-wave inversion.  ____________________________________________  RADIOLOGY   ____________________________________________   PROCEDURES  Procedure(s) performed:   Procedures  Critical Care performed:   ____________________________________________   INITIAL IMPRESSION / ASSESSMENT AND PLAN / ED COURSE  Pertinent labs & imaging results that were available during my care of the patient were reviewed by me and considered in my medical decision making (see chart for details).  -----------------------------------------  12:44 PM on 07/15/2017 -----------------------------------------  Patient feeling improved at this time. No vomiting in the emergency department and he is able to tolerate fluids as well as crackers. He'll be discharged with Zofran as he has not gone home with an anti-medic previously. He will follow-up with his primary care doctor. He is understanding of the plan and willing to comply.he'll continue with his doxycycline.      ____________________________________________   FINAL CLINICAL IMPRESSION(S) / ED DIAGNOSES  Vomiting.    NEW MEDICATIONS STARTED DURING THIS VISIT:  New Prescriptions   No medications on file     Note:  This document was prepared using Dragon voice recognition software and may include unintentional dictation errors.     Myrna Blazer, MD 07/15/17 1245

## 2017-07-15 NOTE — ED Notes (Signed)
Pt transported to CT ?

## 2017-07-15 NOTE — ED Triage Notes (Signed)
Pt to ED via POV. Pt states that he has been vomiting for the past week. Pt seen in our ED last week and has been seen by his PCP. Pt has vomited "several times" in the last 24 hours. Pt denies diarrhea. Pt states that he has not had a bowel movement in about 5 days. Pt denies fever and abdominal pain.

## 2017-07-18 ENCOUNTER — Ambulatory Visit
Admission: RE | Admit: 2017-07-18 | Discharge: 2017-07-18 | Disposition: A | Discharge status: Home / Self Care | Payer: Medicare Other | Source: Ambulatory Visit | Attending: Internal Medicine | Admitting: Internal Medicine

## 2017-07-18 ENCOUNTER — Other Ambulatory Visit: Payer: Self-pay | Admitting: Internal Medicine

## 2017-07-18 DIAGNOSIS — R1084 Generalized abdominal pain: Secondary | ICD-10-CM | POA: Diagnosis present

## 2017-07-18 DIAGNOSIS — I700 Atherosclerosis of aorta: Secondary | ICD-10-CM | POA: Insufficient documentation

## 2017-07-18 DIAGNOSIS — K4090 Unilateral inguinal hernia, without obstruction or gangrene, not specified as recurrent: Secondary | ICD-10-CM | POA: Insufficient documentation

## 2017-07-18 DIAGNOSIS — K5730 Diverticulosis of large intestine without perforation or abscess without bleeding: Secondary | ICD-10-CM | POA: Diagnosis not present

## 2017-07-18 MED ORDER — IOPAMIDOL (ISOVUE-300) INJECTION 61%
100.0000 mL | Freq: Once | INTRAVENOUS | Status: AC | PRN
  Administered 2017-07-18: 100 mL via INTRAVENOUS

## 2017-07-20 ENCOUNTER — Encounter (HOSPITAL_COMMUNITY): Payer: Self-pay | Admitting: Emergency Medicine

## 2017-07-20 ENCOUNTER — Emergency Department (HOSPITAL_COMMUNITY)
Admission: EM | Admit: 2017-07-20 | Discharge: 2017-07-20 | Disposition: A | Discharge status: Home / Self Care | Payer: Medicare Other | Attending: Emergency Medicine | Admitting: Emergency Medicine

## 2017-07-20 DIAGNOSIS — R112 Nausea with vomiting, unspecified: Secondary | ICD-10-CM | POA: Insufficient documentation

## 2017-07-20 DIAGNOSIS — Z79899 Other long term (current) drug therapy: Secondary | ICD-10-CM | POA: Insufficient documentation

## 2017-07-20 DIAGNOSIS — E119 Type 2 diabetes mellitus without complications: Secondary | ICD-10-CM | POA: Diagnosis not present

## 2017-07-20 DIAGNOSIS — D649 Anemia, unspecified: Secondary | ICD-10-CM | POA: Insufficient documentation

## 2017-07-20 DIAGNOSIS — Z87891 Personal history of nicotine dependence: Secondary | ICD-10-CM | POA: Insufficient documentation

## 2017-07-20 LAB — CBC
HCT: 42.8 % (ref 39.0–52.0)
HEMOGLOBIN: 14.6 g/dL (ref 13.0–17.0)
MCH: 29.5 pg (ref 26.0–34.0)
MCHC: 34.1 g/dL (ref 30.0–36.0)
MCV: 86.5 fL (ref 78.0–100.0)
Platelets: 358 10*3/uL (ref 150–400)
RBC: 4.95 MIL/uL (ref 4.22–5.81)
RDW: 13 % (ref 11.5–15.5)
WBC: 6.3 10*3/uL (ref 4.0–10.5)

## 2017-07-20 LAB — COMPREHENSIVE METABOLIC PANEL
ALBUMIN: 4.3 g/dL (ref 3.5–5.0)
ALK PHOS: 86 U/L (ref 38–126)
ALT: 12 U/L — ABNORMAL LOW (ref 17–63)
ANION GAP: 11 (ref 5–15)
AST: 14 U/L — ABNORMAL LOW (ref 15–41)
BILIRUBIN TOTAL: 0.8 mg/dL (ref 0.3–1.2)
BUN: 23 mg/dL — ABNORMAL HIGH (ref 6–20)
CALCIUM: 9.7 mg/dL (ref 8.9–10.3)
CO2: 24 mmol/L (ref 22–32)
Chloride: 94 mmol/L — ABNORMAL LOW (ref 101–111)
Creatinine, Ser: 1.05 mg/dL (ref 0.61–1.24)
Glucose, Bld: 202 mg/dL — ABNORMAL HIGH (ref 65–99)
Potassium: 4.3 mmol/L (ref 3.5–5.1)
Sodium: 129 mmol/L — ABNORMAL LOW (ref 135–145)
TOTAL PROTEIN: 7.6 g/dL (ref 6.5–8.1)

## 2017-07-20 LAB — LIPASE, BLOOD: Lipase: 28 U/L (ref 11–51)

## 2017-07-20 LAB — POC OCCULT BLOOD, ED: FECAL OCCULT BLD: NEGATIVE

## 2017-07-20 MED ORDER — GI COCKTAIL ~~LOC~~
30.0000 mL | Freq: Once | ORAL | Status: AC
  Administered 2017-07-20: 30 mL via ORAL
  Filled 2017-07-20: qty 30

## 2017-07-20 MED ORDER — SUCRALFATE 1 GM/10ML PO SUSP: 1.0000 g | Freq: Three times a day (TID) | ORAL | 0 refills | Status: DC

## 2017-07-20 MED ORDER — SODIUM CHLORIDE 0.9 % IV BOLUS (SEPSIS)
1000.0000 mL | Freq: Once | INTRAVENOUS | Status: AC
  Administered 2017-07-20: 1000 mL via INTRAVENOUS

## 2017-07-20 MED ORDER — ONDANSETRON 4 MG PO TBDP
4.0000 mg | ORAL_TABLET | Freq: Once | ORAL | Status: AC | PRN
  Administered 2017-07-20: 4 mg via ORAL

## 2017-07-20 MED ORDER — ONDANSETRON 4 MG PO TBDP
ORAL_TABLET | ORAL | Status: AC
  Filled 2017-07-20: qty 1

## 2017-07-20 MED ORDER — METOCLOPRAMIDE HCL 10 MG PO TABS: 10.0000 mg | ORAL_TABLET | Freq: Four times a day (QID) | ORAL | 0 refills | Status: DC | PRN

## 2017-07-20 NOTE — ED Provider Notes (Signed)
[facility]-EMERGENCY DEPT Provider Note   CSN: [id number] Arrival date & time: 07/20/17  1123     History   Chief Complaint Chief Complaint  Patient presents with  . Abdominal Pain  . Constipation  . Emesis    HPI LADARIEN Soto Sr. is a 71 y.o. male.  He presents for evaluation of nausea, vomiting, decreased stooling.  He states that he has not had a bowel movement, for 10 days.  He has been treated by his PCP as well as emergency physicians, for nausea and vomiting.  Earlier this month he was diagnosed with possible [facility] spotted fever, and positive IgG but negative IgM.  He was treated with doxycycline, then his doctor changed the antibiotic to Augmentin, because he had "diverticulitis."  He denies fever, chills, cough, dizziness, weakness or paresthesia.  He states that he has "heartburn."  There are no other known modifying factors.  Recent CT scan: IMPRESSION: 1. No acute findings in the abdomen or pelvis. Specifically, no findings to explain the patient's history of fever and generalized abdominal pain. 2. 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 3. Left groin hernia contains a loop of sigmoid colon without complicating features. 4. Contrast material in the distal esophagus may be related to reflux or dysmotility. 5. Aortic Atherosclerois (ICD10-170.0)   Electronically Signed By: Kennith Center M.D. On: 07/18/2017 15:04  HPI  Past Medical History:  Diagnosis Date  . Anemia   . Diabetes mellitus without complication (HCC)   . GERD (gastroesophageal reflux disease)   . History of kidney stones   . Kidney stones     There are no active problems to display for this patient.   Past Surgical History:  Procedure Laterality Date  . APPENDECTOMY    . EYE SURGERY     age 60  . KIDNEY STONE SURGERY    . SHOULDER ARTHROSCOPY WITH BICEPS TENDON REPAIR Right 10/06/2016   Procedure: SHOULDER ARTHROSCOPY WITH BICEPS TENDON REPAIR;  Surgeon: Christena Flake, MD;  Location: [facility] ORS;  Service: Orthopedics;  Laterality: Right;  . SHOULDER ARTHROSCOPY WITH OPEN ROTATOR CUFF REPAIR Right 10/06/2016   Procedure: SHOULDER ARTHROSCOPY WITH OPEN ROTATOR CUFF REPAIR;  Surgeon: Christena Flake, MD;  Location: [facility] ORS;  Service: Orthopedics;  Laterality: Right;  . SHOULDER ARTHROSCOPY WITH SUBACROMIAL DECOMPRESSION Right 10/06/2016   Procedure: SHOULDER ARTHROSCOPY WITH SUBACROMIAL DECOMPRESSION AND DEBRIDEMENT;  Surgeon: Christena Flake, MD;  Location: [facility] ORS;  Service: Orthopedics;  Laterality: Right;  . TONSILLECTOMY         Home Medications    Prior to Admission medications   Medication Sig Start Date End Date Taking? Authorizing Provider  acetaminophen (TYLENOL) 500 MG tablet Take 1,000 mg by mouth every 6 (six) hours as needed.   Yes [provider]  amLODipine (NORVASC) 10 MG tablet Take 10 mg by mouth at bedtime. 05/23/17 05/23/18 Yes [provider]  aspirin 81 MG tablet Take 81 mg by mouth at bedtime.    Yes [provider]  fluticasone (FLONASE) 50 MCG/ACT nasal spray Place 1 spray into both nostrils daily as needed for allergies or rhinitis.   Yes [provider]  glyBURIDE-metformin (GLUCOVANCE) 2.5-500 MG tablet Take 1 tablet by mouth at bedtime.    Yes [provider]  lisinopril-hydrochlorothiazide (PRINZIDE,ZESTORETIC) 20-12.5 MG tablet Take 1 tablet by mouth at bedtime. 07/11/17  Yes [provider]  oxyCODONE (ROXICODONE) 5 MG immediate release tablet Take 1-2 tablets (5-10 mg total) by mouth every 4 (four)  hours as needed for severe pain. 10/06/16  Yes Poggi, Excell Seltzer, MD  pioglitazone (ACTOS) 15 MG tablet Take 15 mg by mouth at bedtime.    Yes [provider]  pravastatin (PRAVACHOL) 80 MG tablet Take 80 mg by mouth every evening.   Yes [provider]  lisinopril (PRINIVIL,ZESTRIL) 20 MG tablet Take 20 mg by mouth at bedtime.     [provider]    metoCLOPramide (REGLAN) 10 MG tablet Take 1 tablet (10 mg total) by mouth every 6 (six) hours as needed for nausea (nausea/headache). 07/20/17   [name], MD  omeprazole (PRILOSEC) 20 MG capsule Take 20 mg by mouth as needed.    [provider]  sucralfate (CARAFATE) 1 GM/10ML suspension Take 10 mLs (1 g total) by mouth 4 (four) times daily -  with meals and at bedtime. 07/20/17   [name], MD    Family History No family history on file.  Social History Social History  Substance Use Topics  . Smoking status: Former Smoker    Packs/day: 0.25    Years: 10.00    Types: Cigarettes    Quit date: 09/30/1969  . Smokeless tobacco: Never Used  . Alcohol use No     Allergies   Patient has no known allergies.   Review of Systems Review of Systems  All other systems reviewed and are negative.    Physical Exam Updated Vital Signs BP (!) 142/76   Pulse 63   Temp 98 F (36.7 C) (Oral)   Resp 15   Wt 83 kg (183 lb)   SpO2 100%   BMI 24.14 kg/m   Physical Exam  Constitutional: He is oriented to person, place, and time. He appears well-developed and well-nourished. No distress.  HENT:  Head: Normocephalic and atraumatic.  Right Ear: External ear normal.  Left Ear: External ear normal.  Eyes: Pupils are equal, round, and reactive to light. Conjunctivae and EOM are normal.  Neck: Normal range of motion and phonation normal. Neck supple.  Cardiovascular: Normal rate, regular rhythm and normal heart sounds.   Pulmonary/Chest: Effort normal and breath sounds normal. He exhibits no bony tenderness.  Abdominal: Soft. He exhibits no distension. There is no tenderness (Diffuse, mild).  Genitourinary:  Genitourinary Comments: Normal anus.  On digital examination, prostate slightly enlarged, but nontender.  No blood or stool in the rectal vault.  No palpable rectal mass.  No pain on digital rectal examination.  Musculoskeletal: Normal range of motion.  Neurological: He  is alert and oriented to person, place, and time. No cranial nerve deficit or sensory deficit. He exhibits normal muscle tone. Coordination normal.  Skin: Skin is warm, dry and intact.  Psychiatric: He has a normal mood and affect. His behavior is normal. Judgment and thought content normal.  Nursing note and vitals reviewed.    ED Treatments / Results  Labs (all labs ordered are listed, but only abnormal results are displayed) Labs Reviewed  COMPREHENSIVE METABOLIC PANEL - Abnormal; Notable for the following:       Result Value   Sodium 129 (*)    Chloride 94 (*)    Glucose, Bld 202 (*)    BUN 23 (*)    AST 14 (*)    ALT 12 (*)    All other components within normal limits  LIPASE, BLOOD  CBC  URINALYSIS, ROUTINE W REFLEX MICROSCOPIC  POC OCCULT BLOOD, ED    EKG  EKG Interpretation None  Radiology No results found.  Procedures Procedures (including critical care time)  Medications Ordered in ED Medications  ondansetron (ZOFRAN-ODT) 4 MG disintegrating tablet (not administered)  ondansetron (ZOFRAN-ODT) disintegrating tablet 4 mg (4 mg Oral Given 07/20/17 1143)  gi cocktail (Maalox,Lidocaine,Donnatal) (30 mLs Oral Given 07/20/17 1521)  sodium chloride 0.9 % bolus 1,000 mL (1,000 mLs Intravenous New Bag/Given 07/20/17 1524)     Initial Impression / Assessment and Plan / ED Course  I have reviewed the triage vital signs and the nursing notes.  Pertinent labs & imaging results that were available during my care of the patient were reviewed by me and considered in my medical decision making (see chart for details).  Clinical Course as of Jul 20 1556  Thu Jul 20, 2017  1538 Chart reviewed from [facility] visit, 07/18/18.  CT images from that date also reviewed.  Patient has diverticulosis, but not diverticulitis.  Despite the CT finding a PCP placed him on Augmentin, for possible radio occult diverticulitis.  He also stopped the doxycycline at that time.  [EW]    Clinical  Course User Index [EW] [name], MD     Patient Vitals for the past 24 hrs:  BP Temp Temp src Pulse Resp SpO2 Weight  07/20/17 1500 (!) 142/76 - - 63 15 100 % -  07/20/17 1434 (!) 160/84 - - 70 - 99 % -  07/20/17 1136 131/79 98 F (36.7 C) Oral 86 17 99 % -  07/20/17 1135 - - - - - - 83 kg (183 lb)    3:38 PM Reevaluation with update and discussion. After initial assessment and treatment, an updated evaluation reveals he remains comfortable and has no further complaints.  Findings discussed with patient and family members, all questions answered. Jeanifer Halliday L      Final Clinical Impressions(s) / ED Diagnoses   Final diagnoses:  Nausea and vomiting, intractability of vomiting not specified, unspecified vomiting type     Nausea and vomiting with decreased stooling associated with decreased oral intake.  Review of chart indicates patient does not have any good evidence for diverticulitis therefore he does not need to be on antibiotics which will likely worsen his condition at this time.  Patient has heartburn symptoms and may have a component of reflux disease or peptic ulcer disease.  Will elevate treatment for this with addition of Carafate.  Also changing antiemetic to Reglan may promote intestinal mobility which could help.  No clinical or evidence for significant metabolic instability or volume depletion.  Doubt serious bacterial infection or impending vascular collapse.  Nursing Notes Reviewed/ Care Coordinated Applicable Imaging Reviewed Interpretation of Laboratory Data incorporated into ED treatment  The patient appears reasonably screened and/or stabilized for discharge and I doubt any other medical condition or other [facility] requiring further screening, evaluation, or treatment in the ED at this time prior to discharge.  Plan: Home Medications-continue current medications except Augmentin, and Zofran; Home Treatments-push oral fluids; return here if the recommended  treatment, does not improve the symptoms; Recommended follow up-PCP as needed    New Prescriptions New Prescriptions   METOCLOPRAMIDE (REGLAN) 10 MG TABLET    Take 1 tablet (10 mg total) by mouth every 6 (six) hours as needed for nausea (nausea/headache).   SUCRALFATE (CARAFATE) 1 GM/10ML SUSPENSION    Take 10 mLs (1 g total) by mouth 4 (four) times daily -  with meals and at bedtime.     [name], MD 07/20/17 [phone]

## 2017-07-20 NOTE — ED Triage Notes (Signed)
Pt reports continued n/v, reports unable to have BM for 2 weeks.  Pt reports starting tx for diverticulitis Tuesday, reports no improvement.

## 2017-07-20 NOTE — Discharge Instructions (Signed)
Your nausea and vomiting may be related to heartburn, or ulcer disease.  Continue taking your medications with the exception of Augmentin and Zofran.  Also we are prescribing an additional treatment for reflux or esophagitis, which may help your heartburn.  There is no indication that you have diverticulitis at this time.  Work hard on trying to eat and drink regularly.  Starting with a bland diet, and clear liquids is the best treatment at this time.  We are supplying information for bland diet, as well as a diet that is good for reflux.

## 2017-07-23 ENCOUNTER — Emergency Department: Payer: Medicare Other

## 2017-07-23 ENCOUNTER — Encounter: Payer: Self-pay | Admitting: Emergency Medicine

## 2017-07-23 ENCOUNTER — Emergency Department
Admission: EM | Admit: 2017-07-23 | Discharge: 2017-07-23 | Disposition: A | Discharge status: Home / Self Care | Payer: Medicare Other | Attending: Student in an Organized Health Care Education/Training Program | Admitting: Student in an Organized Health Care Education/Training Program

## 2017-07-23 DIAGNOSIS — E119 Type 2 diabetes mellitus without complications: Secondary | ICD-10-CM | POA: Insufficient documentation

## 2017-07-23 DIAGNOSIS — Z87891 Personal history of nicotine dependence: Secondary | ICD-10-CM | POA: Diagnosis not present

## 2017-07-23 DIAGNOSIS — R109 Unspecified abdominal pain: Secondary | ICD-10-CM | POA: Diagnosis present

## 2017-07-23 DIAGNOSIS — Z7984 Long term (current) use of oral hypoglycemic drugs: Secondary | ICD-10-CM | POA: Diagnosis not present

## 2017-07-23 DIAGNOSIS — Z7982 Long term (current) use of aspirin: Secondary | ICD-10-CM | POA: Insufficient documentation

## 2017-07-23 DIAGNOSIS — R110 Nausea: Secondary | ICD-10-CM | POA: Diagnosis not present

## 2017-07-23 DIAGNOSIS — Z79899 Other long term (current) drug therapy: Secondary | ICD-10-CM | POA: Diagnosis not present

## 2017-07-23 LAB — URINALYSIS, COMPLETE (UACMP) WITH MICROSCOPIC
BACTERIA UA: NONE SEEN
Bilirubin Urine: NEGATIVE
GLUCOSE, UA: NEGATIVE mg/dL
HGB URINE DIPSTICK: NEGATIVE
Ketones, ur: NEGATIVE mg/dL
LEUKOCYTES UA: NEGATIVE
NITRITE: NEGATIVE
Protein, ur: 100 mg/dL — AB
SPECIFIC GRAVITY, URINE: 1.023 (ref 1.005–1.030)
Squamous Epithelial / LPF: NONE SEEN
pH: 5 (ref 5.0–8.0)

## 2017-07-23 LAB — COMPREHENSIVE METABOLIC PANEL
ALT: 13 U/L — ABNORMAL LOW (ref 17–63)
AST: 17 U/L (ref 15–41)
Albumin: 4.7 g/dL (ref 3.5–5.0)
Alkaline Phosphatase: 82 U/L (ref 38–126)
Anion gap: 12 (ref 5–15)
BILIRUBIN TOTAL: 1 mg/dL (ref 0.3–1.2)
BUN: 23 mg/dL — AB (ref 6–20)
CO2: 26 mmol/L (ref 22–32)
Calcium: 9.9 mg/dL (ref 8.9–10.3)
Chloride: 93 mmol/L — ABNORMAL LOW (ref 101–111)
Creatinine, Ser: 1.13 mg/dL (ref 0.61–1.24)
Glucose, Bld: 164 mg/dL — ABNORMAL HIGH (ref 65–99)
POTASSIUM: 3.8 mmol/L (ref 3.5–5.1)
Sodium: 131 mmol/L — ABNORMAL LOW (ref 135–145)
TOTAL PROTEIN: 8.2 g/dL — AB (ref 6.5–8.1)

## 2017-07-23 LAB — CBC
HEMATOCRIT: 44.1 % (ref 40.0–52.0)
Hemoglobin: 15.1 g/dL (ref 13.0–18.0)
MCH: 29.6 pg (ref 26.0–34.0)
MCHC: 34.4 g/dL (ref 32.0–36.0)
MCV: 86.2 fL (ref 80.0–100.0)
PLATELETS: 390 10*3/uL (ref 150–440)
RBC: 5.11 MIL/uL (ref 4.40–5.90)
RDW: 14.1 % (ref 11.5–14.5)
WBC: 7.4 10*3/uL (ref 3.8–10.6)

## 2017-07-23 LAB — LIPASE, BLOOD: Lipase: 27 U/L (ref 11–51)

## 2017-07-23 LAB — TROPONIN I: Troponin I: <0.03 ng/mL (ref <0.03)

## 2017-07-23 MED ORDER — POLYETHYLENE GLYCOL 3350 17 G PO PACK
17.0000 g | PACK | Freq: Every day | ORAL | 0 refills | Status: DC
Start: 2017-07-23 — End: 2017-08-03

## 2017-07-23 MED ORDER — RANITIDINE HCL 150 MG PO TABS: 150.0000 mg | ORAL_TABLET | Freq: Two times a day (BID) | ORAL | 1 refills | Status: DC

## 2017-07-23 MED ORDER — LACTULOSE 10 GM/15ML PO SOLN
ORAL | Status: AC
  Filled 2017-07-23: qty 30

## 2017-07-23 MED ORDER — ONDANSETRON 4 MG PO TBDP
4.0000 mg | ORAL_TABLET | Freq: Once | ORAL | Status: AC
  Administered 2017-07-23: 4 mg via ORAL
  Filled 2017-07-23: qty 1

## 2017-07-23 MED ORDER — LACTULOSE 10 GM/15ML PO SOLN
30.0000 g | Freq: Once | ORAL | Status: AC
  Administered 2017-07-23: 30 g via ORAL
  Filled 2017-07-23: qty 45

## 2017-07-23 NOTE — ED Provider Notes (Signed)
[facility] Emergency Department Provider Note    First MD Initiated Contact with Patient 07/23/17 1506     (approximate)  I have reviewed the triage vital signs and the nursing notes.   HISTORY  Chief Complaint Abdominal Pain    HPI Alex Soto. is a 71 y.o. male 2 weeks of generalized abdominal discomfort and not moving his bowel in 2 weeks. He is passing gas. This is not able to keep any food down. Denies any fevers. Has been on doxycycline for "tick fever ". Was seen at [facility] just 3 days ago for similar symptoms. CT imaging showed diverticulosis without diverticulitis. Also had some R visit here at this facility where CT head was ordered and shows no acute abnormality.   Past Medical History:  Diagnosis Date  . Anemia   . Diabetes mellitus without complication (HCC)   . GERD (gastroesophageal reflux disease)   . History of kidney stones   . Kidney stones    History reviewed. No pertinent family history. Past Surgical History:  Procedure Laterality Date  . APPENDECTOMY    . EYE SURGERY     age 27  . KIDNEY STONE SURGERY    . SHOULDER ARTHROSCOPY WITH BICEPS TENDON REPAIR Right 10/06/2016   Procedure: SHOULDER ARTHROSCOPY WITH BICEPS TENDON REPAIR;  Surgeon: Christena Flake, MD;  Location: [facility] ORS;  Service: Orthopedics;  Laterality: Right;  . SHOULDER ARTHROSCOPY WITH OPEN ROTATOR CUFF REPAIR Right 10/06/2016   Procedure: SHOULDER ARTHROSCOPY WITH OPEN ROTATOR CUFF REPAIR;  Surgeon: Christena Flake, MD;  Location: [facility] ORS;  Service: Orthopedics;  Laterality: Right;  . SHOULDER ARTHROSCOPY WITH SUBACROMIAL DECOMPRESSION Right 10/06/2016   Procedure: SHOULDER ARTHROSCOPY WITH SUBACROMIAL DECOMPRESSION AND DEBRIDEMENT;  Surgeon: Christena Flake, MD;  Location: [facility] ORS;  Service: Orthopedics;  Laterality: Right;  . TONSILLECTOMY     There are no active problems to display for this patient.     Prior to Admission medications   Medication Sig Start  Date End Date Taking? Authorizing Provider  acetaminophen (TYLENOL) 500 MG tablet Take 1,000 mg by mouth every 6 (six) hours as needed.    [provider]  amLODipine (NORVASC) 10 MG tablet Take 10 mg by mouth at bedtime. 05/23/17 05/23/18  [provider]  aspirin 81 MG tablet Take 81 mg by mouth at bedtime.     [provider]  fluticasone (FLONASE) 50 MCG/ACT nasal spray Place 1 spray into both nostrils daily as needed for allergies or rhinitis.    [provider]  glyBURIDE-metformin (GLUCOVANCE) 2.5-500 MG tablet Take 1 tablet by mouth at bedtime.     [provider]  lisinopril (PRINIVIL,ZESTRIL) 20 MG tablet Take 20 mg by mouth at bedtime.     [provider]  lisinopril-hydrochlorothiazide (PRINZIDE,ZESTORETIC) 20-12.5 MG tablet Take 1 tablet by mouth at bedtime. 07/11/17   [provider]  metoCLOPramide (REGLAN) 10 MG tablet Take 1 tablet (10 mg total) by mouth every 6 (six) hours as needed for nausea (nausea/headache). 07/20/17   [name], MD  omeprazole (PRILOSEC) 20 MG capsule Take 20 mg by mouth as needed.    [provider]  oxyCODONE (ROXICODONE) 5 MG immediate release tablet Take 1-2 tablets (5-10 mg total) by mouth every 4 (four) hours as needed for severe pain. 10/06/16   Poggi, Excell Seltzer, MD  pioglitazone (ACTOS) 15 MG tablet Take 15 mg by mouth at bedtime.     [provider]  pravastatin (  PRAVACHOL) 80 MG tablet Take 80 mg by mouth every evening.    [provider]  sucralfate (CARAFATE) 1 GM/10ML suspension Take 10 mLs (1 g total) by mouth 4 (four) times daily -  with meals and at bedtime. 07/20/17   [name], MD    Allergies Patient has no known allergies.    Social History Social History  Substance Use Topics  . Smoking status: Former Smoker    Packs/day: 0.25    Years: 10.00    Types: Cigarettes    Quit date: 09/30/1969  . Smokeless tobacco: Never Used  . Alcohol use No      Review of Systems Patient denies headaches, rhinorrhea, blurry vision, numbness, shortness of breath, chest pain, edema, cough, abdominal pain, nausea, vomiting, diarrhea, dysuria, fevers, rashes or hallucinations unless otherwise stated above in HPI. ____________________________________________   PHYSICAL EXAM:  VITAL SIGNS: Vitals:   07/23/17 1247  BP: 130/81  Pulse: 82  Resp: 18  SpO2: 100%    Constitutional: Alert and oriented. Well appearing and in no acute distress. Eyes: Conjunctivae are normal.  Head: Atraumatic. Nose: No congestion/rhinnorhea. Mouth/Throat: Mucous membranes are moist.   Neck: No stridor. Painless ROM.  Cardiovascular: Normal rate, regular rhythm. Grossly normal heart sounds.  Good peripheral circulation. Respiratory: Normal respiratory effort.  No retractions. Lungs CTAB. Gastrointestinal: Soft and nontender. No distention. No abdominal bruits.  Normoactive BS. No CVA tenderness. Genitourinary: no fecal impaction or masses Musculoskeletal: No lower extremity tenderness nor edema.  No joint effusions. Neurologic:  Normal speech and language. No gross focal neurologic deficits are appreciated. No facial droop Skin:  Skin is warm, dry and intact. No rash noted. Psychiatric: Mood and affect are normal. Speech and behavior are normal.  ____________________________________________   LABS (all labs ordered are listed, but only abnormal results are displayed)  Results for orders placed or performed during the hospital encounter of 07/23/17 (from the past 24 hour(s))  Lipase, blood     Status: None   Collection Time: 07/23/17 12:49 PM  Result Value Ref Range   Lipase 27 11 - 51 U/L  Comprehensive metabolic panel     Status: Abnormal   Collection Time: 07/23/17 12:49 PM  Result Value Ref Range   Sodium 131 (L) 135 - 145 mmol/L   Potassium 3.8 3.5 - 5.1 mmol/L   Chloride 93 (L) 101 - 111 mmol/L   CO2 26 22 - 32 mmol/L   Glucose, Bld 164 (H) 65 - 99  mg/dL   BUN 23 (H) 6 - 20 mg/dL   Creatinine, Ser 1.61 0.61 - 1.24 mg/dL   Calcium 9.9 8.9 - 09.6 mg/dL   Total Protein 8.2 (H) 6.5 - 8.1 g/dL   Albumin 4.7 3.5 - 5.0 g/dL   AST 17 15 - 41 U/L   ALT 13 (L) 17 - 63 U/L   Alkaline Phosphatase 82 38 - 126 U/L   Total Bilirubin 1.0 0.3 - 1.2 mg/dL   GFR calc non Af Amer >60 >60 mL/min   GFR calc Af Amer >60 >60 mL/min   Anion gap 12 5 - 15  CBC     Status: None   Collection Time: 07/23/17 12:49 PM  Result Value Ref Range   WBC 7.4 3.8 - 10.6 K/uL   RBC 5.11 4.40 - 5.90 MIL/uL   Hemoglobin 15.1 13.0 - 18.0 g/dL   HCT 04.5 40.9 - 81.1 %   MCV 86.2 80.0 - 100.0 fL   MCH 29.6 26.0 -  34.0 pg   MCHC 34.4 32.0 - 36.0 g/dL   RDW 16.1 09.6 - 04.5 %   Platelets 390 150 - 440 K/uL  Urinalysis, Complete w Microscopic     Status: Abnormal   Collection Time: 07/23/17 12:49 PM  Result Value Ref Range   Color, Urine YELLOW (A) YELLOW   APPearance HAZY (A) CLEAR   Specific Gravity, Urine 1.023 1.005 - 1.030   pH 5.0 5.0 - 8.0   Glucose, UA NEGATIVE NEGATIVE mg/dL   Hgb urine dipstick NEGATIVE NEGATIVE   Bilirubin Urine NEGATIVE NEGATIVE   Ketones, ur NEGATIVE NEGATIVE mg/dL   Protein, ur 409 (A) NEGATIVE mg/dL   Nitrite NEGATIVE NEGATIVE   Leukocytes, UA NEGATIVE NEGATIVE   RBC / HPF 0-5 0 - 5 RBC/hpf   WBC, UA 0-5 0 - 5 WBC/hpf   Bacteria, UA NONE SEEN NONE SEEN   Squamous Epithelial / LPF NONE SEEN NONE SEEN   Mucus PRESENT    Hyaline Casts, UA PRESENT    ____________________________________________  EKG My review and personal interpretation at Time: 16:27   Indication: nausea  Rate: 70  Rhythm: sinus Axis: normal Other: normal intervals, no stemi, poor r wave progression ____________________________________________  RADIOLOGY  I personally reviewed all radiographic images ordered to evaluate for the above acute complaints and reviewed radiology reports and findings.  These findings were personally discussed with the patient.   Please see medical record for radiology report.  ____________________________________________   PROCEDURES  Procedure(s) performed:  Procedures    Critical Care performed: no ____________________________________________   INITIAL IMPRESSION / ASSESSMENT AND PLAN / ED COURSE  Pertinent labs & imaging results that were available during my care of the patient were reviewed by me and considered in my medical decision making (see chart for details).  DDX: enteritis, gastritis, constipation, diverticulitis, sbo, unlikely acs  Rollin Kotowski. is a 71 y.o. who presents to the ED with 2 weeks of epigastric discomfort as described above. Had extensive workup over the past two weeks including CT imaging and radiographs as well as blood work. Patient presents with persistent symptoms.  as afebrile and well perfused. His abdominal exam is soft and benign. Do not feel that repeat CT imaging clinically indicated. His rectal exam shows no fecal impaction. Trachea does not show any evidence of obstructive process but is suggestive of decreased motility or constipation.  Clinical Course as of Jul 23 1600  Sun Jul 23, 2017  1557 patient tolerated lactulose without any vomiting. He has no fecal impaction. The blood that his presentation is most consistent with constipation.   [PR]    Clinical Course User Index [PR] Willy Eddy, MD   She is tolerating oral hydration. His EKG shows no acute ischemia. Troponin is negative. May have some component of gastritis after recently starting doxycycline and therefore we'll start him on Zantac and give referral to GI.  Have discussed with the patient and available family all diagnostics and treatments performed thus far and all questions were answered to the best of my ability. The patient demonstrates understanding and agreement with plan.   ____________________________________________   FINAL CLINICAL IMPRESSION(S) / ED DIAGNOSES  Final diagnoses:    Nausea      NEW MEDICATIONS STARTED DURING THIS VISIT:  New Prescriptions   No medications on file     Note:  This document was prepared using Dragon voice recognition software and may include unintentional dictation errors.     Willy Eddy, MD 07/23/17 1739

## 2017-07-23 NOTE — Discharge Instructions (Signed)

## 2017-07-23 NOTE — ED Triage Notes (Signed)
Pt c/o lower abdominal pain for 12 days.  Has been seen multiple times and told has diverticulitis and has also been told he has ulcers.  Has had nausea and vomiting.  No bowel movement in 2 weeks per pt, abdomen soft at this time.  No fevers.  VSS.

## 2017-07-23 NOTE — ED Notes (Signed)
Pt discharged home after verbalizing understanding of discharge instructions; nad noted. 

## 2017-07-23 NOTE — ED Notes (Signed)
Pt presents with ongoing abdominal pain, n/v x 2 weeks. States he has not had a bm in 2 weeks, and that he has been to his pcp as well as to the hospital. He has been told he has "tick fever," diverticulitis, ulcers. States he was given reglan and carafate, which he is taking, with no relief. He thinks he has gall bladder issues. Pt alert & oriented with NAD noted.

## 2017-08-03 ENCOUNTER — Other Ambulatory Visit: Payer: Self-pay

## 2017-08-03 ENCOUNTER — Encounter: Payer: Self-pay | Admitting: Gastroenterology

## 2017-08-03 ENCOUNTER — Ambulatory Visit (INDEPENDENT_AMBULATORY_CARE_PROVIDER_SITE_OTHER): Payer: Medicare Other | Admitting: Gastroenterology

## 2017-08-03 VITALS — BP 133/69 | HR 97 | Temp 98.9°F | Ht 72.0 in | Wt 176.4 lb

## 2017-08-03 DIAGNOSIS — R112 Nausea with vomiting, unspecified: Secondary | ICD-10-CM | POA: Diagnosis not present

## 2017-08-03 NOTE — Progress Notes (Signed)
Arlyss Repress, MD [address]  [address]  [place], [place] [address]  Main: [phone]  Fax: [phone]    Gastroenterology Consultation  Referring Provider:     [name], MD Primary Care Physician:  [name], MD Primary Gastroenterologist:  Dr. Arlyss Repress Reason for Consultation:     Intractable nausea and vomiting        HPI:   Alex Soto. is a 71 y.o. y/o male referred by Dr. Dareen Piano, Marya Amsler, MD  for consultation & management of Intractable nausea, vomiting. The symptoms started on 07/08/2017 with nausea, vomiting, fever. He went to the emergency room on 07/08/17 with these symptoms, lipase was normal, had mild hyponatremia, received IV fluids, antiemetics and was discharged home. He followed up with his PCP on 9/11 and was prescribed doxycycline for 10 days. He also had serologies for tickborne illness which showed positive IgG antibodies for [facility] spotted fever, IgM antibodies were negative. Subsequently, he had 2 ER visits for intractable nausea and vomiting and was conservatively managed with IV fluids, antiemetics and GI cocktail. He also had CT head and A/P on 9/15 and 9/18 which did not reveal any acute pathology. Was discharged home on promethazine suppository.  Currently, he reports that his symptoms resolved other than early morning nausea which is relieved after breakfast. His appetite has improved. He denies any vomiting, eating three full meals daily. He is having regular bowel movements. He denies rectal bleeding, hematemesis, melena, abdominal pain, fever/chills[place] denies smoking, alcohol, denies NSAID use or illicit drug use. Did not have any GI surgeries in the past. He reports that his diabetes is under control.  He is accompanied by his wife today  GI Procedures: None, he did not have a screening colonoscopy or denies having fecal occult testing for colon cancer screening.  Past Medical  History:  Diagnosis Date  . Anemia   . Diabetes mellitus without complication (HCC)   . GERD (gastroesophageal reflux disease)   . History of kidney stones   . Kidney stones     Past Surgical History:  Procedure Laterality Date  . APPENDECTOMY    . EYE SURGERY     age 71  . KIDNEY STONE SURGERY    . SHOULDER ARTHROSCOPY WITH BICEPS TENDON REPAIR Right 10/06/2016   Procedure: SHOULDER ARTHROSCOPY WITH BICEPS TENDON REPAIR;  Surgeon: Christena Flake, MD;  Location: [facility] ORS;  Service: Orthopedics;  Laterality: Right;  . SHOULDER ARTHROSCOPY WITH OPEN ROTATOR CUFF REPAIR Right 10/06/2016   Procedure: SHOULDER ARTHROSCOPY WITH OPEN ROTATOR CUFF REPAIR;  Surgeon: Christena Flake, MD;  Location: [facility] ORS;  Service: Orthopedics;  Laterality: Right;  . SHOULDER ARTHROSCOPY WITH SUBACROMIAL DECOMPRESSION Right 10/06/2016   Procedure: SHOULDER ARTHROSCOPY WITH SUBACROMIAL DECOMPRESSION AND DEBRIDEMENT;  Surgeon: Christena Flake, MD;  Location: [facility] ORS;  Service: Orthopedics;  Laterality: Right;  . TONSILLECTOMY      Prior to Admission medications   Medication Sig Start Date End Date Taking? Authorizing Provider  acetaminophen (TYLENOL) 500 MG tablet Take 1,000 mg by mouth every 6 (six) hours as needed.    [provider]  amLODipine (NORVASC) 10 MG tablet Take 10 mg by mouth at bedtime. 05/23/17 05/23/18  [provider]  aspirin 81 MG tablet Take 81 mg by mouth at bedtime.     [provider]  cyclobenzaprine (FLEXERIL) 10 MG tablet TAKE 1/2 TO 1 TABLET BY MOUTH THREE TIMES  A DAY AS NEEDED FOR MUSCLE SPASMS 06/17/17   [provider]  fluticasone (FLONASE) 50 MCG/ACT nasal spray Place 1 spray into both nostrils daily as needed for allergies or rhinitis.    [provider]  glyBURIDE-metformin (GLUCOVANCE) 2.5-500 MG tablet Take 1 tablet by mouth at bedtime.     [provider]  lactulose (CHRONULAC) 10 GM/15ML solution take 30 milliliters by mouth three  times a day for 10 days 07/27/17   [provider]  lisinopril-hydrochlorothiazide (PRINZIDE,ZESTORETIC) 20-12.5 MG tablet Take 1 tablet by mouth at bedtime. 07/11/17   [provider]  metoCLOPramide (REGLAN) 10 MG tablet Take 1 tablet (10 mg total) by mouth every 6 (six) hours as needed for nausea (nausea/headache). 07/20/17   [name], MD  omeprazole (PRILOSEC) 20 MG capsule Take 20 mg by mouth as needed.    [provider]  ondansetron (ZOFRAN) 8 MG tablet take 1 to 2 tablets by mouth every 8 hours if needed for nausea for UP TO 10 DAYS 07/18/17   [provider]  oxyCODONE (ROXICODONE) 5 MG immediate release tablet Take 1-2 tablets (5-10 mg total) by mouth every 4 (four) hours as needed for severe pain. 10/06/16   Poggi, Excell Seltzer, MD  pioglitazone (ACTOS) 15 MG tablet Take 15 mg by mouth at bedtime.     [provider]  polyethylene glycol (MIRALAX / GLYCOLAX) packet Take 17 g by mouth daily. Mix one tablespoon with 8oz of your favorite juice or water every day until you are having soft formed stools. Then start taking once daily if you didn't have a stool the day before. 07/23/17   Willy Eddy, MD  pravastatin (PRAVACHOL) 80 MG tablet Take 80 mg by mouth every evening.    [provider]  promethazine (PHENERGAN) 25 MG suppository Place rectally. 07/26/17 08/15/17  [provider]  ranitidine (ZANTAC) 150 MG tablet Take 1 tablet (150 mg total) by mouth 2 (two) times daily. 07/23/17 07/23/18  Willy Eddy, MD  sucralfate (CARAFATE) 1 GM/10ML suspension Take 10 mLs (1 g total) by mouth 4 (four) times daily -  with meals and at bedtime. 07/20/17   [name], MD    No family history on file.   Social History  Substance Use Topics  . Smoking status: Former Smoker    Packs/day: 0.25    Years: 10.00    Types: Cigarettes    Quit date: 09/30/1969  . Smokeless tobacco: Never Used  . Alcohol use No    Allergies as of  08/03/2017  . (No Known Allergies)    Review of Systems:    All systems reviewed and negative except where noted in HPI.   Physical Exam:  BP 133/69   Pulse 97   Temp 98.9 F (37.2 C) (Oral)   Ht 6' (1.829 m)   Wt 176 lb 6.4 oz (80 kg)   BMI 23.92 kg/m  No LMP for male patient.  General:   Alert,  Well-developed, well-nourished, pleasant and cooperative in NAD Head:  Normocephalic and atraumatic. Eyes:  Sclera clear, no icterus.   Conjunctiva pink. Ears:  Normal auditory acuity. Nose:  No deformity, discharge, or lesions. Mouth:  No deformity or lesions,oropharynx pink & moist. Neck:  Supple; no masses or thyromegaly. Lungs:  Respirations even and unlabored.  Clear throughout to auscultation.   No wheezes, crackles, or rhonchi. No acute distress. Heart:  Regular rate and rhythm; no murmurs, clicks, rubs, or gallops. Abdomen:  Normal bowel sounds.  No bruits.  Soft, non-tender and non-distended without masses, hepatosplenomegaly or hernias noted.  No guarding or rebound tenderness.   Rectal: Nor performed Msk:  Symmetrical without gross deformities. Good, equal movement & strength bilaterally. Pulses:  Normal pulses noted. Extremities:  No clubbing or edema.  No cyanosis. Neurologic:  Alert and oriented x3;  grossly normal neurologically. Skin:  Intact without significant lesions or rashes. No jaundice, skin changes in his sun exposed areas secondary to chronic sun exposure. Lymph Nodes:  No significant cervical adenopathy. Psych:  Alert and cooperative. Normal mood and affect.  Imaging Studies: Reviewed  Assessment and Plan:   Henry Utsey. is a 71 y.o. male with Diabetes, hypertension with 4 weeks' history of intractable nausea vomiting, 1 episode of fever early in the course, currently asymptomatic. Probably, his symptoms aren't related to self-limiting viral gastritis which have now resolved.  No further workup needed at this time Recommend screening colonoscopy  and patient reported that he will call my office and schedule in next 3-4 weeks  Follow up as needed   Arlyss Repress, MD

## 2019-07-07 ENCOUNTER — Other Ambulatory Visit: Payer: Self-pay

## 2019-07-07 ENCOUNTER — Emergency Department: Payer: Medicare HMO

## 2019-07-07 ENCOUNTER — Emergency Department
Admission: EM | Admit: 2019-07-07 | Discharge: 2019-07-07 | Disposition: A | Discharge status: Home / Self Care | Payer: Medicare HMO | Attending: Emergency Medicine | Admitting: Emergency Medicine

## 2019-07-07 DIAGNOSIS — I129 Hypertensive chronic kidney disease with stage 1 through stage 4 chronic kidney disease, or unspecified chronic kidney disease: Secondary | ICD-10-CM | POA: Diagnosis not present

## 2019-07-07 DIAGNOSIS — Z79899 Other long term (current) drug therapy: Secondary | ICD-10-CM | POA: Insufficient documentation

## 2019-07-07 DIAGNOSIS — R42 Dizziness and giddiness: Secondary | ICD-10-CM | POA: Diagnosis not present

## 2019-07-07 DIAGNOSIS — E1122 Type 2 diabetes mellitus with diabetic chronic kidney disease: Secondary | ICD-10-CM | POA: Insufficient documentation

## 2019-07-07 DIAGNOSIS — Z7982 Long term (current) use of aspirin: Secondary | ICD-10-CM | POA: Diagnosis not present

## 2019-07-07 DIAGNOSIS — Z87891 Personal history of nicotine dependence: Secondary | ICD-10-CM | POA: Insufficient documentation

## 2019-07-07 DIAGNOSIS — Z7984 Long term (current) use of oral hypoglycemic drugs: Secondary | ICD-10-CM | POA: Insufficient documentation

## 2019-07-07 DIAGNOSIS — N182 Chronic kidney disease, stage 2 (mild): Secondary | ICD-10-CM | POA: Diagnosis not present

## 2019-07-07 LAB — BASIC METABOLIC PANEL
Anion gap: 8 (ref 5–15)
BUN: 19 mg/dL (ref 8–23)
CO2: 25 mmol/L (ref 22–32)
Calcium: 9 mg/dL (ref 8.9–10.3)
Chloride: 104 mmol/L (ref 98–111)
Creatinine, Ser: 0.91 mg/dL (ref 0.61–1.24)
GFR calc Af Amer: >60 mL/min (ref >60)
GFR calc non Af Amer: >60 mL/min (ref >60)
Glucose, Bld: 147 mg/dL — ABNORMAL HIGH (ref 70–99)
Potassium: 4.1 mmol/L (ref 3.5–5.1)
Sodium: 137 mmol/L (ref 135–145)

## 2019-07-07 LAB — CBC
HCT: 37.5 % — ABNORMAL LOW (ref 39.0–52.0)
Hemoglobin: 12.2 g/dL — ABNORMAL LOW (ref 13.0–17.0)
MCH: 29 pg (ref 26.0–34.0)
MCHC: 32.5 g/dL (ref 30.0–36.0)
MCV: 89.3 fL (ref 80.0–100.0)
Platelets: 264 10*3/uL (ref 150–400)
RBC: 4.2 MIL/uL — ABNORMAL LOW (ref 4.22–5.81)
RDW: 14.4 % (ref 11.5–15.5)
WBC: 4.4 10*3/uL (ref 4.0–10.5)
nRBC: 0 % (ref 0.0–0.2)

## 2019-07-07 LAB — GLUCOSE, CAPILLARY: Glucose-Capillary: 135 mg/dL — ABNORMAL HIGH (ref 70–99)

## 2019-07-07 MED ORDER — SODIUM CHLORIDE 0.9 % IV BOLUS
500.0000 mL | Freq: Once | INTRAVENOUS | Status: AC
  Administered 2019-07-07: 500 mL via INTRAVENOUS

## 2019-07-07 NOTE — ED Triage Notes (Signed)
Pt c/o feeling dizzy and off balance since yesterday, states this happens whenever his sugar is elevated.

## 2019-07-07 NOTE — ED Provider Notes (Signed)
[facility] Emergency Department Provider Note   ____________________________________________   First MD Initiated Contact with Patient 07/07/19 [phone]     (approximate)  I have reviewed the triage vital signs and the nursing notes.   HISTORY  Chief Complaint Dizziness    HPI Alex Soto. is a 73 y.o. male with a history of diabetes and kidney stones and anemia  Patient reports that he has been feeling lightheaded for the last day.  Started yesterday morning.  He notices when he stands up and is up and about moving he just feels lightheaded.  No spinning or numbness or weakness anywhere.  No facial droop.  Patient feels fine when he sits down and rest, but when he stands up he will begin to feel lightheaded after a short period.  He does attribute that he has had high blood sugars in the past that felt similar.  He is on medication now, but reports he does not use any insulins  No recent illness.  No exposure to COVID.  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  No chest pain.  No trouble breathing and no abdominal pain   Past Medical History:  Diagnosis Date  . Anemia   . Diabetes mellitus without complication (HCC)   . GERD (gastroesophageal reflux disease)   . History of kidney stones   . Kidney stones     Patient Active Problem List   Diagnosis Date Noted  . Tendonitis of upper biceps tendon of right shoulder 10/10/2016  . Complete tear of right rotator cuff 09/14/2016  . Tendinitis of right rotator cuff 09/14/2016  . Atherosclerosis of abdominal aorta (HCC) 08/16/2014  . GERD (gastroesophageal reflux disease) 04/13/2014  . HTN (hypertension), benign 04/13/2014  . Hyperlipidemia, unspecified 04/13/2014  . Type 2 diabetes mellitus with stage 2 chronic kidney disease  (HCC) 04/13/2014    Past Surgical History:  Procedure Laterality Date  . APPENDECTOMY    . EYE SURGERY     age 70  . KIDNEY STONE SURGERY    . SHOULDER ARTHROSCOPY WITH BICEPS TENDON REPAIR Right 10/06/2016   Procedure: SHOULDER ARTHROSCOPY WITH BICEPS TENDON REPAIR;  Surgeon: Christena Flake, MD;  Location: [facility] ORS;  Service: Orthopedics;  Laterality: Right;  . SHOULDER ARTHROSCOPY WITH OPEN ROTATOR CUFF REPAIR Right 10/06/2016   Procedure: SHOULDER ARTHROSCOPY WITH OPEN ROTATOR CUFF REPAIR;  Surgeon: Christena Flake, MD;  Location: [facility] ORS;  Service: Orthopedics;  Laterality: Right;  . SHOULDER ARTHROSCOPY WITH SUBACROMIAL DECOMPRESSION Right 10/06/2016   Procedure: SHOULDER ARTHROSCOPY WITH SUBACROMIAL DECOMPRESSION AND DEBRIDEMENT;  Surgeon: Christena Flake, MD;  Location: [facility] ORS;  Service: Orthopedics;  Laterality: Right;  . TONSILLECTOMY      Prior to Admission medications   Medication Sig Start Date End Date Taking? Authorizing Provider  acetaminophen (TYLENOL) 500 MG tablet Take 1,000 mg by mouth every 6 (six) hours as needed.    [provider]  amLODipine (NORVASC) 10 MG tablet Take 10 mg by mouth at bedtime. 05/23/17 05/23/18  [provider]  aspirin 81 MG tablet Take 81 mg by mouth at bedtime.     [provider]  cyclobenzaprine (FLEXERIL) 10 MG tablet TAKE 1/2 TO 1 TABLET BY MOUTH THREE TIMES A DAY AS NEEDED FOR MUSCLE SPASMS  06/17/17   [provider]  fluticasone (FLONASE) 50 MCG/ACT nasal spray Place 1 spray into both nostrils daily as needed for allergies or rhinitis.    [provider]  glyBURIDE-metformin (GLUCOVANCE) 2.5-500 MG tablet Take 1 tablet by mouth at bedtime.     [provider]  lactulose (CHRONULAC) 10 GM/15ML solution take 30 milliliters by mouth three times a day for 10 days 07/27/17   [provider]  lisinopril-hydrochlorothiazide (PRINZIDE,ZESTORETIC) 20-12.5 MG tablet Take 1 tablet by mouth at  bedtime. 07/11/17   [provider]  omeprazole (PRILOSEC) 20 MG capsule Take 20 mg by mouth as needed.    [provider]  oxyCODONE (ROXICODONE) 5 MG immediate release tablet Take 1-2 tablets (5-10 mg total) by mouth every 4 (four) hours as needed for severe pain. 10/06/16   Poggi, Excell SeltzerJohn J, MD  pioglitazone (ACTOS) 15 MG tablet Take 15 mg by mouth at bedtime.     [provider]  pravastatin (PRAVACHOL) 80 MG tablet Take 80 mg by mouth every evening.    [provider]  promethazine (PHENERGAN) 25 MG suppository Place rectally. 07/26/17 08/15/17  [provider]  ranitidine (ZANTAC) 150 MG tablet Take 1 tablet (150 mg total) by mouth 2 (two) times daily. 07/23/17 07/23/18  Willy Eddyobinson, Patrick, MD  sucralfate (CARAFATE) 1 GM/10ML suspension Take 10 mLs (1 g total) by mouth 4 (four) times daily -  with meals and at bedtime. 07/20/17   Mancel BaleWentz, Elliott, MD    Allergies Patient has no known allergies.  No family history on file.  Social History Social History   Tobacco Use  . Smoking status: Former Smoker    Packs/day: 0.25    Years: 10.00    Pack years: 2.50    Types: Cigarettes    Quit date: 09/30/1969    Years since quitting: 49.8  . Smokeless tobacco: Never Used  Substance Use Topics  . Alcohol use: No  . Drug use: No    Review of Systems Constitutional: No fever/chills just feels "lightheaded" when he stands up Eyes: No visual changes. ENT: No sore throat.  Feels a bit thirsty. Cardiovascular: Denies chest pain. Respiratory: Denies shortness of breath. Gastrointestinal: No abdominal pain.   Genitourinary: Negative for dysuria. Musculoskeletal: Negative for back pain. Skin: Negative for rash. Neurological: Negative for headaches, areas of focal weakness or numbness.  Wife reports he has not been acting unusually.  His speech is been clear.    ____________________________________________   PHYSICAL EXAM:  VITAL SIGNS: ED Triage  Vitals  Enc Vitals Group     BP 07/07/19 0914 (!) 171/67     Pulse Rate 07/07/19 0914 (!) 50     Resp 07/07/19 0914 20     Temp 07/07/19 0914 97.9 F (36.6 C)     Temp Source 07/07/19 0914 Oral     SpO2 07/07/19 0914 100 %     Weight 07/07/19 0910 176 lb (79.8 kg)     Height 07/07/19 0910 6\' 1"  (1.854 m)     Head Circumference --      Peak Flow --      Pain Score 07/07/19 0910 0     Pain Loc --      Pain Edu? --      Excl. in GC? --     Constitutional: Alert and oriented. Well appearing and in no acute distress. Eyes: Conjunctivae are normal. Head: Atraumatic. Nose: No congestion/rhinnorhea. Mouth/Throat: Mucous membranes are slightly dry, edentulous. Neck: No  stridor.  Cardiovascular: Normal rate, regular rhythm. Grossly normal heart sounds.  Good peripheral circulation. Respiratory: Normal respiratory effort.  No retractions. Lungs CTAB. Gastrointestinal: Soft and nontender. No distention. Musculoskeletal: No lower extremity tenderness nor edema.  5 out of 5 strength in all extremities.  No ataxia in any extremity.  No pronator drift in extremity.  Cranial nerve exam normal.  Normal ocular movements. Neurologic:  Normal speech and language. No gross focal neurologic deficits are appreciated.  Skin:  Skin is warm, dry and intact. No rash noted. Psychiatric: Mood and affect are normal. Speech and behavior are normal.  ____________________________________________   LABS (all labs ordered are listed, but only abnormal results are displayed)  Labs Reviewed  CBC - Abnormal; Notable for the following components:      Result Value   RBC 4.20 (*)    Hemoglobin 12.2 (*)    HCT 37.5 (*)    All other components within normal limits  BASIC METABOLIC PANEL - Abnormal; Notable for the following components:   Glucose, Bld 147 (*)    All other components within normal limits  GLUCOSE, CAPILLARY - Abnormal; Notable for the following components:   Glucose-Capillary 135 (*)    All  other components within normal limits  CBG MONITORING, ED   ____________________________________________  EKG  Reviewed entered by me at 940 Heart rate 59 QRS 100 QTc 460 Normal sinus rhythm, no evidence of acute ischemia.  Appears same in morphology compared with July 15, 2017 ____________________________________________  RADIOLOGY  Ct Head Wo Contrast  Result Date: 07/07/2019 CLINICAL DATA:  Dizziness, vertigo. EXAM: CT HEAD WITHOUT CONTRAST TECHNIQUE: Contiguous axial images were obtained from the base of the skull through the vertex without intravenous contrast. COMPARISON:  07/15/2017 FINDINGS: Brain: No evidence of acute infarction, hemorrhage, hydrocephalus, extra-axial collection or mass lesion/mass effect. There is mild diffuse low-attenuation within the subcortical and periventricular white matter compatible with chronic microvascular disease. Prominence of the sulci and ventricles compatible with brain atrophy. Vascular: No hyperdense vessel or unexpected calcification. Skull: Normal. Negative for fracture or focal lesion. Sinuses/Orbits: Chronic mucosal thickening and opacification of the sphenoid sinus is again noted. Mastoid air cells are clear. Other: None. IMPRESSION: 1. No acute intracranial abnormalities. 2. Chronic small vessel ischemic change and brain atrophy. 3. Chronic opacification of the sphenoid sinus. Electronically Signed   By: Signa Kellaylor  Stroud M.D.   On: 07/07/2019 11:14     ____________________________________________   PROCEDURES  Procedure(s) performed: None  Procedures  Critical Care performed: No ____________________________________________   INITIAL IMPRESSION / ASSESSMENT AND PLAN / ED COURSE  Pertinent labs & imaging results that were available during my care of the patient were reviewed by me and considered in my medical decision making (see chart for details).   Patient comes for lightheadedness.  Reassuring neurologic exam without focal  deficits.  Denies any cardiac or pulmonary symptoms.  Reassuring EKG and his lab work reassuring as well, has mild anemia this appears to be chronic in review of [facility] system.  He does report exertional activity, working outside yesterday and feels that in the past he had similar symptoms when he had high blood sugar, perhaps this could be some type of dehydration though I am not 100% convinced.  Will trial fluids, check orthostatics.  Screen for underlying etiologies of medical illness.  No infectious symptoms  Clinical Course as of Jul 07 1151  Sun Jul 07, 2019  1101 Patient resting comfortably.  Reports feeling well.  No focal neurologic  deficits, did discuss with him and his wife however that it seems unlikely in his case, but none for further evaluation of his lightheadedness or dizziness did recommend a CT scan to assure there is no signs of any small or subtle stroke that could have occurred causing his dizziness over last 24 hours.  Patient agreeable with CT scan.   [MQ]    Clinical Course User Index [MQ] Delman Kitten, MD    [place] was evaluated in Emergency Department on 07/07/2019 for the symptoms described in the history of present illness. He was evaluated in the context of the global COVID-19 pandemic, which necessitated consideration that the patient might be at risk for infection with the SARS-CoV-2 virus that causes COVID-19. Institutional protocols and algorithms that pertain to the evaluation of patients at risk for COVID-19 are in a state of rapid change based on information released by regulatory bodies including the CDC and federal and state organizations. These policies and algorithms were followed during the patient's care in the ED.  ----------------------------------------- 11:52 AM on 07/07/2019 -----------------------------------------  Patient ambulating well without distress, reports he feels better.  No complaints at this time is not experiencing lightheadedness  with standing or walking any longer.  Return precautions and treatment recommendations and follow-up discussed with the patient who is agreeable with the plan.  ____________________________________________   FINAL CLINICAL IMPRESSION(S) / ED DIAGNOSES  Final diagnoses:  Lightheadedness        Note:  This document was prepared using Dragon voice recognition software and may include unintentional dictation errors       Delman Kitten, MD 07/07/19 1152

## 2019-07-07 NOTE — ED Notes (Signed)
Pt ambulated down hallway- pt denies any dizziness or lightheadedness with ambulation- pt remains off monitoring equipment to use bathroom

## 2020-02-29 ENCOUNTER — Inpatient Hospital Stay
Admission: EM | Admit: 2020-02-29 | Discharge: 2020-03-02 | DRG: 617 | Disposition: A | Discharge status: Home / Self Care | Payer: Medicare HMO | Attending: Internal Medicine | Admitting: Internal Medicine

## 2020-02-29 ENCOUNTER — Other Ambulatory Visit: Payer: Self-pay

## 2020-02-29 ENCOUNTER — Emergency Department: Payer: Medicare HMO

## 2020-02-29 DIAGNOSIS — E1169 Type 2 diabetes mellitus with other specified complication: Principal | ICD-10-CM | POA: Diagnosis present

## 2020-02-29 DIAGNOSIS — Z87891 Personal history of nicotine dependence: Secondary | ICD-10-CM | POA: Diagnosis not present

## 2020-02-29 DIAGNOSIS — E1122 Type 2 diabetes mellitus with diabetic chronic kidney disease: Secondary | ICD-10-CM | POA: Diagnosis present

## 2020-02-29 DIAGNOSIS — I10 Essential (primary) hypertension: Secondary | ICD-10-CM | POA: Diagnosis not present

## 2020-02-29 DIAGNOSIS — E1140 Type 2 diabetes mellitus with diabetic neuropathy, unspecified: Secondary | ICD-10-CM | POA: Diagnosis present

## 2020-02-29 DIAGNOSIS — K219 Gastro-esophageal reflux disease without esophagitis: Secondary | ICD-10-CM | POA: Diagnosis present

## 2020-02-29 DIAGNOSIS — Z20822 Contact with and (suspected) exposure to covid-19: Secondary | ICD-10-CM | POA: Diagnosis present

## 2020-02-29 DIAGNOSIS — M86171 Other acute osteomyelitis, right ankle and foot: Secondary | ICD-10-CM | POA: Diagnosis present

## 2020-02-29 DIAGNOSIS — N182 Chronic kidney disease, stage 2 (mild): Secondary | ICD-10-CM | POA: Diagnosis present

## 2020-02-29 DIAGNOSIS — Z79899 Other long term (current) drug therapy: Secondary | ICD-10-CM | POA: Diagnosis not present

## 2020-02-29 DIAGNOSIS — Z7982 Long term (current) use of aspirin: Secondary | ICD-10-CM | POA: Diagnosis not present

## 2020-02-29 DIAGNOSIS — Z79891 Long term (current) use of opiate analgesic: Secondary | ICD-10-CM | POA: Diagnosis not present

## 2020-02-29 DIAGNOSIS — D631 Anemia in chronic kidney disease: Secondary | ICD-10-CM | POA: Diagnosis present

## 2020-02-29 DIAGNOSIS — Z7984 Long term (current) use of oral hypoglycemic drugs: Secondary | ICD-10-CM

## 2020-02-29 DIAGNOSIS — L089 Local infection of the skin and subcutaneous tissue, unspecified: Secondary | ICD-10-CM | POA: Diagnosis present

## 2020-02-29 DIAGNOSIS — L97909 Non-pressure chronic ulcer of unspecified part of unspecified lower leg with unspecified severity: Secondary | ICD-10-CM | POA: Diagnosis not present

## 2020-02-29 DIAGNOSIS — D649 Anemia, unspecified: Secondary | ICD-10-CM | POA: Diagnosis present

## 2020-02-29 DIAGNOSIS — I129 Hypertensive chronic kidney disease with stage 1 through stage 4 chronic kidney disease, or unspecified chronic kidney disease: Secondary | ICD-10-CM | POA: Diagnosis present

## 2020-02-29 DIAGNOSIS — L97509 Non-pressure chronic ulcer of other part of unspecified foot with unspecified severity: Secondary | ICD-10-CM | POA: Diagnosis not present

## 2020-02-29 DIAGNOSIS — E1165 Type 2 diabetes mellitus with hyperglycemia: Secondary | ICD-10-CM | POA: Diagnosis not present

## 2020-02-29 DIAGNOSIS — E119 Type 2 diabetes mellitus without complications: Secondary | ICD-10-CM

## 2020-02-29 DIAGNOSIS — Z87442 Personal history of urinary calculi: Secondary | ICD-10-CM

## 2020-02-29 DIAGNOSIS — E11621 Type 2 diabetes mellitus with foot ulcer: Secondary | ICD-10-CM | POA: Diagnosis present

## 2020-02-29 DIAGNOSIS — I700 Atherosclerosis of aorta: Secondary | ICD-10-CM | POA: Diagnosis present

## 2020-02-29 DIAGNOSIS — M869 Osteomyelitis, unspecified: Secondary | ICD-10-CM

## 2020-02-29 DIAGNOSIS — E11622 Type 2 diabetes mellitus with other skin ulcer: Secondary | ICD-10-CM | POA: Diagnosis not present

## 2020-02-29 DIAGNOSIS — E785 Hyperlipidemia, unspecified: Secondary | ICD-10-CM | POA: Diagnosis present

## 2020-02-29 LAB — COMPREHENSIVE METABOLIC PANEL
ALT: 16 U/L (ref 0–44)
AST: 18 U/L (ref 15–41)
Albumin: 4 g/dL (ref 3.5–5.0)
Alkaline Phosphatase: 108 U/L (ref 38–126)
Anion gap: 13 (ref 5–15)
BUN: 24 mg/dL — ABNORMAL HIGH (ref 8–23)
CO2: 22 mmol/L (ref 22–32)
Calcium: 9.3 mg/dL (ref 8.9–10.3)
Chloride: 102 mmol/L (ref 98–111)
Creatinine, Ser: 1.06 mg/dL (ref 0.61–1.24)
GFR calc Af Amer: >60 mL/min (ref >60)
GFR calc non Af Amer: >60 mL/min (ref >60)
Glucose, Bld: 167 mg/dL — ABNORMAL HIGH (ref 70–99)
Potassium: 3.7 mmol/L (ref 3.5–5.1)
Sodium: 137 mmol/L (ref 135–145)
Total Bilirubin: 1.2 mg/dL (ref 0.3–1.2)
Total Protein: 7.8 g/dL (ref 6.5–8.1)

## 2020-02-29 LAB — CBC WITH DIFFERENTIAL/PLATELET
Abs Immature Granulocytes: 0.03 10*3/uL (ref 0.00–0.07)
Basophils Absolute: 0 10*3/uL (ref 0.0–0.1)
Basophils Relative: 1 %
Eosinophils Absolute: 0 10*3/uL (ref 0.0–0.5)
Eosinophils Relative: 0 %
HCT: 35.3 % — ABNORMAL LOW (ref 39.0–52.0)
Hemoglobin: 11.8 g/dL — ABNORMAL LOW (ref 13.0–17.0)
Immature Granulocytes: 0 %
Lymphocytes Relative: 11 %
Lymphs Abs: 0.8 10*3/uL (ref 0.7–4.0)
MCH: 30.3 pg (ref 26.0–34.0)
MCHC: 33.4 g/dL (ref 30.0–36.0)
MCV: 90.7 fL (ref 80.0–100.0)
Monocytes Absolute: 0.5 10*3/uL (ref 0.1–1.0)
Monocytes Relative: 7 %
Neutro Abs: 6.3 10*3/uL (ref 1.7–7.7)
Neutrophils Relative %: 81 %
Platelets: 291 10*3/uL (ref 150–400)
RBC: 3.89 MIL/uL — ABNORMAL LOW (ref 4.22–5.81)
RDW: 13.3 % (ref 11.5–15.5)
WBC: 7.7 10*3/uL (ref 4.0–10.5)
nRBC: 0 % (ref 0.0–0.2)

## 2020-02-29 LAB — SEDIMENTATION RATE: Sed Rate: 65 mm/hr — ABNORMAL HIGH (ref 0–20)

## 2020-02-29 LAB — LACTIC ACID, PLASMA
Lactic Acid, Venous: 1.8 mmol/L (ref 0.5–1.9)
Lactic Acid, Venous: 1.2 mmol/L (ref 0.5–1.9)

## 2020-02-29 MED ORDER — VANCOMYCIN HCL IN DEXTROSE 1-5 GM/200ML-% IV SOLN: 1000.0000 mg | Freq: Once | INTRAVENOUS | Status: DC

## 2020-02-29 MED ORDER — METRONIDAZOLE IN NACL 5-0.79 MG/ML-% IV SOLN
500.0000 mg | Freq: Once | INTRAVENOUS | Status: DC
  Filled 2020-02-29: qty 100

## 2020-02-29 MED ORDER — SODIUM CHLORIDE 0.9 % IV SOLN
2.0000 g | Freq: Once | INTRAVENOUS | Status: DC
  Filled 2020-02-29: qty 2

## 2020-02-29 NOTE — ED Triage Notes (Signed)
Patient c/o right foot pain and swelling. Wound seen to 3rd toe with swelling and drainage. Redness on toe, extending up foot.

## 2020-02-29 NOTE — ED Notes (Signed)
Pt ambulatory to toilet without assistANCE, UNMEASURE OUTPUT, pt shoes off and into wheelchair for transport by this RN to floor

## 2020-02-29 NOTE — Progress Notes (Signed)
PHARMACY -  BRIEF ANTIBIOTIC NOTE   Pharmacy has received consult(s) for Cefepime and Vancomycin from an ED provider.  The patient's profile has been reviewed for ht/wt/allergies/indication/available labs.    One time order(s) placed for Cefepime 2gm and Vancomycin 2gm (1gm + 1gm)  Further antibiotics/pharmacy consults should be ordered by admitting physician if indicated.                       Thank you, Valrie Hart A 02/29/2020  10:02 PM

## 2020-02-29 NOTE — ED Provider Notes (Signed)
[facility] Emergency Department Provider Note  ____________________________________________  Time seen: Approximately 11:36 PM  I have reviewed the triage vital signs and the nursing notes.   HISTORY  Chief Complaint Wound Infection    HPI Alex Krist. is a 74 y.o. male with a history of GERD hypertension diabetes who comes ED complaining of right foot pain and swelling for the past 3 days, gradual onset, denies trauma.  No chest pain shortness of breath or fever.  Denies vascular disease.  No aggravating or alleviating factors.  Nonradiating pain.      Past Medical History:  Diagnosis Date  . Anemia   . Diabetes mellitus without complication ([place])   . GERD (gastroesophageal reflux disease)   . History of kidney stones   . Kidney stones      Patient Active Problem List   Diagnosis Date Noted  . Osteomyelitis ([place]) 02/29/2020  . Tendonitis of upper biceps tendon of right shoulder 10/10/2016  . Complete tear of right rotator cuff 09/14/2016  . Tendinitis of right rotator cuff 09/14/2016  . Atherosclerosis of abdominal aorta ([place]) 08/16/2014  . GERD (gastroesophageal reflux disease) 04/13/2014  . HTN (hypertension), benign 04/13/2014  . Hyperlipidemia, unspecified 04/13/2014  . Type 2 diabetes mellitus with stage 2 chronic kidney disease ([place]) 04/13/2014     Past Surgical History:  Procedure Laterality Date  . APPENDECTOMY    . EYE SURGERY     age 51  . KIDNEY STONE SURGERY    . SHOULDER ARTHROSCOPY WITH BICEPS TENDON REPAIR Right 10/06/2016   Procedure: SHOULDER ARTHROSCOPY WITH BICEPS TENDON REPAIR;  Surgeon: Corky Mull, MD;  Location: [facility] ORS;  Service: Orthopedics;  Laterality: Right;  . SHOULDER ARTHROSCOPY WITH OPEN ROTATOR CUFF REPAIR Right 10/06/2016   Procedure: SHOULDER ARTHROSCOPY WITH OPEN ROTATOR CUFF REPAIR;  Surgeon: Corky Mull, MD;  Location: [facility] ORS;  Service: Orthopedics;  Laterality: Right;  . SHOULDER ARTHROSCOPY  WITH SUBACROMIAL DECOMPRESSION Right 10/06/2016   Procedure: SHOULDER ARTHROSCOPY WITH SUBACROMIAL DECOMPRESSION AND DEBRIDEMENT;  Surgeon: Corky Mull, MD;  Location: [facility] ORS;  Service: Orthopedics;  Laterality: Right;  . TONSILLECTOMY       Prior to Admission medications   Medication Sig Start Date End Date Taking? Authorizing Provider  acetaminophen (TYLENOL) 500 MG tablet Take 1,000 mg by mouth every 6 (six) hours as needed.    [provider]  amLODipine (NORVASC) 10 MG tablet Take 10 mg by mouth at bedtime. 05/23/17 05/23/18  [provider]  aspirin 81 MG tablet Take 81 mg by mouth at bedtime.     [provider]  cyclobenzaprine (FLEXERIL) 10 MG tablet TAKE 1/2 TO 1 TABLET BY MOUTH THREE TIMES A DAY AS NEEDED FOR MUSCLE SPASMS 06/17/17   [provider]  fluticasone (FLONASE) 50 MCG/ACT nasal spray Place 1 spray into both nostrils daily as needed for allergies or rhinitis.    [provider]  glyBURIDE-metformin (GLUCOVANCE) 2.5-500 MG tablet Take 1 tablet by mouth at bedtime.     [provider]  lactulose (CHRONULAC) 10 GM/15ML solution take 30 milliliters by mouth three times a day for 10 days 07/27/17   [provider]  lisinopril-hydrochlorothiazide (PRINZIDE,ZESTORETIC) 20-12.5 MG tablet Take 1 tablet by mouth at bedtime. 07/11/17   [provider]  omeprazole (PRILOSEC) 20 MG capsule Take 20 mg by mouth as needed.    [provider]  oxyCODONE (ROXICODONE) 5 MG immediate release tablet Take 1-2 tablets (5-10 mg  total) by mouth every 4 (four) hours as needed for severe pain. 10/06/16   Poggi, Excell Seltzer, MD  pioglitazone (ACTOS) 15 MG tablet Take 15 mg by mouth at bedtime.     [provider]  pravastatin (PRAVACHOL) 80 MG tablet Take 80 mg by mouth every evening.    [provider]  promethazine (PHENERGAN) 25 MG suppository Place rectally. 07/26/17 08/15/17  [provider]   ranitidine (ZANTAC) 150 MG tablet Take 1 tablet (150 mg total) by mouth 2 (two) times daily. 07/23/17 07/23/18  Willy Eddy, MD  sucralfate (CARAFATE) 1 GM/10ML suspension Take 10 mLs (1 g total) by mouth 4 (four) times daily -  with meals and at bedtime. 07/20/17   [name], MD     Allergies Patient has no known allergies.   No family history on file.  Social History Social History   Tobacco Use  . Smoking status: Former Smoker    Packs/day: 0.25    Years: 10.00    Pack years: 2.50    Types: Cigarettes    Quit date: 09/30/1969    Years since quitting: 50.4  . Smokeless tobacco: Former Engineer, water Use Topics  . Alcohol use: No  . Drug use: No    Review of Systems  Constitutional:   No fever or chills.  ENT:   No sore throat. No rhinorrhea. Cardiovascular:   No chest pain or syncope. Respiratory:   No dyspnea or cough. Gastrointestinal:   Negative for abdominal pain, vomiting and diarrhea.  Musculoskeletal: Right third toe and foot pain and swelling.  All other systems reviewed and are negative except as documented above in ROS and HPI.  ____________________________________________   PHYSICAL EXAM:  VITAL SIGNS: ED Triage Vitals  Enc Vitals Group     BP 02/29/20 1954 (!) 173/77     Pulse Rate 02/29/20 1954 92     Resp 02/29/20 1954 18     Temp 02/29/20 1954 98.6 F (37 C)     Temp src --      SpO2 02/29/20 1954 98 %     Weight 02/29/20 1955 190 lb (86.2 kg)     Height 02/29/20 1955 6\' 1"  (1.854 m)     Head Circumference --      Peak Flow --      Pain Score 02/29/20 1954 8     Pain Loc --      Pain Edu? --      Excl. in GC? --     Vital signs reviewed, nursing assessments reviewed.   Constitutional:   Alert and oriented. Non-toxic appearance. Eyes:   Conjunctivae are normal. EOMI. PERRL. ENT      Head:   Normocephalic and atraumatic.      Nose:   Wearing a mask.      Mouth/Throat:   Wearing a mask.      Neck:   No meningismus. Full  ROM. Hematological/Lymphatic/Immunilogical:   No cervical lymphadenopathy. Cardiovascular:   RRR. Symmetric bilateral radial and DP pulses.  No murmurs. Cap refill less than 2 seconds. Respiratory:   Normal respiratory effort without tachypnea/retractions. Breath sounds are clear and equal bilaterally. No wheezes/rales/rhonchi. Gastrointestinal:   Soft and nontender. Non distended. There is no CVA tenderness.  No rebound, rigidity, or guarding.  Musculoskeletal:   Normal range of motion in all extremities. No joint effusions.  There is pronounced swelling erythema and soft tissue ulceration of the right third toe concerning for diabetic wound and osteomyelitis.  There is swelling and tenderness of the distal foot as well.  No crepitus. Neurologic:   Normal speech and language.  Motor grossly intact. No acute focal neurologic deficits are appreciated.  Skin:    Skin is warm, dry and intact. No rash noted.  No petechiae, purpura, or bullae.  ____________________________________________    LABS (pertinent positives/negatives) (all labs ordered are listed, but only abnormal results are displayed) Labs Reviewed  COMPREHENSIVE METABOLIC PANEL - Abnormal; Notable for the following components:      Result Value   Glucose, Bld 167 (*)    BUN 24 (*)    All other components within normal limits  CBC WITH DIFFERENTIAL/PLATELET - Abnormal; Notable for the following components:   RBC 3.89 (*)    Hemoglobin 11.8 (*)    HCT 35.3 (*)    All other components within normal limits  CULTURE, BLOOD (ROUTINE X 2)  CULTURE, BLOOD (ROUTINE X 2)  RESPIRATORY PANEL BY RT PCR (FLU A&B, COVID)  LACTIC ACID, PLASMA  LACTIC ACID, PLASMA  SEDIMENTATION RATE  C-REACTIVE PROTEIN   ____________________________________________   EKG    ____________________________________________    RADIOLOGY  DG Foot Complete Right  Result Date: 02/29/2020 CLINICAL DATA:  Infection. Right foot pain and swelling. Wound  on the third toe. EXAM: RIGHT FOOT COMPLETE - 3+ VIEW COMPARISON:  February 05, 2005 FINDINGS: There is soft tissue swelling about the third digit. There are erosions involving the distal phalanx of the third digit. There are degenerative changes of the interphalangeal joints, greatest within the fourth and fifth digits. There are degenerative changes of the midfoot. There is a small plantar calcaneal spur. Vascular calcifications are noted. IMPRESSION: 1. Erosions involving the distal phalanx of the third digit concerning for osteomyelitis. 2. Soft tissue swelling about the third digit. 3. Degenerative changes of the interphalangeal joints and midfoot. Electronically Signed   By: Katherine Mantle M.D.   On: 02/29/2020 21:08    ____________________________________________   PROCEDURES Procedures  ____________________________________________    CLINICAL IMPRESSION / ASSESSMENT AND PLAN / ED COURSE  Medications ordered in the ED: Medications - No data to display  Pertinent labs & imaging results that were available during my care of the patient were reviewed by me and considered in my medical decision making (see chart for details).  PAVLOS YON Sr. was evaluated in Emergency Department on 02/29/2020 for the symptoms described in the history of present illness. He was evaluated in the context of the global COVID-19 pandemic, which necessitated consideration that the patient might be at risk for infection with the SARS-CoV-2 virus that causes COVID-19. Institutional protocols and algorithms that pertain to the evaluation of patients at risk for COVID-19 are in a state of rapid change based on information released by regulatory bodies including the CDC and federal and state organizations. These policies and algorithms were followed during the patient's care in the ED.   Patient presents with diabetic foot wound.  Vital signs unremarkable, not septic.  X-ray of the right foot shows bony erosion  consistent with osteomyelitis of the right third toe.  Serum labs unremarkable, lactate normal.  Discussed with hospitalist who recommends withholding antibiotics for now pending attempted culture of the wound and podiatry examination.      ____________________________________________   FINAL CLINICAL IMPRESSION(S) / ED DIAGNOSES    Final diagnoses:  Other acute osteomyelitis of right foot (HCC)  Type 2 diabetes mellitus without complication, without long-term current use of insulin (HCC)  ED Discharge Orders    None      Portions of this note were generated with dragon dictation software. Dictation errors may occur despite best attempts at proofreading.   Sharman Cheek, MD 02/29/20 [phone]

## 2020-02-29 NOTE — H&P (Signed)
History and Physical    Alex ROBLERO Sr. GYJ:[id number] DOB: 03-22-1946 DOA: 02/29/2020  PCP: Kirk Ruths, MD  Patient coming from: Home  Chief Complaint: Swelling and infection of his toe  HPI: Alex Soto. is a 74 y.o. male with a pertinent history of GERD, hypertension, diabetes who presents to the [facility] ED with a toe infection.  Pt has been having 3 days of worsening redness and erythema of his 3rd middle toe.  There is an ulcer on the bottom.  He states this started after ripping a "hang nail off" which took a flap of skin with it.  Never happenned before.  He thinks that his diabetes is been fairly well controlled but recently with this infection he did notice some higher numbers with his blood sugars.  In the ED is seen more dynamically stable.  X-rays showed findings concerning for osteomyelitis.  Antibiotics were halted.   Review of Systems: As per HPI otherwise 10 point review of systems negative.  Other pertinents as below:  General -denies new headaches or visual changes, recent infections denies any fevers or chills HEENT -denies any visual changes, weight change Cardio -denies any chest pain, palpitations Resp -denies any cough or shortness of breath GI -denies any nausea, vomiting, diarrhea, GI pain GU -denies any urinary changes MSK -denies any other new joint or back pain Skin -denies any new skin changes or rashes Neuro -denies any new numbness or weakness Psych -denies any new anxiety or depression  Past Medical History:  Diagnosis Date  . Anemia   . Diabetes mellitus without complication ([place])   . GERD (gastroesophageal reflux disease)   . History of kidney stones   . Kidney stones     Past Surgical History:  Procedure Laterality Date  . APPENDECTOMY    . EYE SURGERY     age 22  . KIDNEY STONE SURGERY    . SHOULDER ARTHROSCOPY WITH BICEPS TENDON REPAIR Right 10/06/2016   Procedure: SHOULDER ARTHROSCOPY WITH BICEPS TENDON REPAIR;   Surgeon: Corky Mull, MD;  Location: [facility] ORS;  Service: Orthopedics;  Laterality: Right;  . SHOULDER ARTHROSCOPY WITH OPEN ROTATOR CUFF REPAIR Right 10/06/2016   Procedure: SHOULDER ARTHROSCOPY WITH OPEN ROTATOR CUFF REPAIR;  Surgeon: Corky Mull, MD;  Location: [facility] ORS;  Service: Orthopedics;  Laterality: Right;  . SHOULDER ARTHROSCOPY WITH SUBACROMIAL DECOMPRESSION Right 10/06/2016   Procedure: SHOULDER ARTHROSCOPY WITH SUBACROMIAL DECOMPRESSION AND DEBRIDEMENT;  Surgeon: Corky Mull, MD;  Location: [facility] ORS;  Service: Orthopedics;  Laterality: Right;  . TONSILLECTOMY       reports that he quit smoking about 50 years ago. His smoking use included cigarettes. He has a 2.50 pack-year smoking history. He has quit using smokeless tobacco. He reports that he does not drink alcohol or use drugs.  No Known Allergies  History reviewed. No pertinent family history.  Prior to Admission medications   Medication Sig Start Date End Date Taking? Authorizing Provider  acetaminophen (TYLENOL) 500 MG tablet Take 1,000 mg by mouth every 6 (six) hours as needed.    [provider]  amLODipine (NORVASC) 10 MG tablet Take 10 mg by mouth at bedtime. 05/23/17 05/23/18  [provider]  aspirin 81 MG tablet Take 81 mg by mouth at bedtime.     [provider]  cyclobenzaprine (FLEXERIL) 10 MG tablet TAKE 1/2 TO 1 TABLET BY MOUTH THREE TIMES A DAY AS NEEDED FOR MUSCLE SPASMS 06/17/17   [provider]  fluticasone (FLONASE) 50 MCG/ACT nasal spray Place 1 spray into both nostrils daily as needed for allergies or rhinitis.    [provider]  glyBURIDE-metformin (GLUCOVANCE) 2.5-500 MG tablet Take 1 tablet by mouth at bedtime.     [provider]  lactulose (CHRONULAC) 10 GM/15ML solution take 30 milliliters by mouth three times a day for 10 days 07/27/17   [provider]  lisinopril-hydrochlorothiazide (PRINZIDE,ZESTORETIC) 20-12.5 MG tablet Take 1 tablet  by mouth at bedtime. 07/11/17   [provider]  omeprazole (PRILOSEC) 20 MG capsule Take 20 mg by mouth as needed.    [provider]  oxyCODONE (ROXICODONE) 5 MG immediate release tablet Take 1-2 tablets (5-10 mg total) by mouth every 4 (four) hours as needed for severe pain. 10/06/16   Poggi, Marshall Cork, MD  pioglitazone (ACTOS) 15 MG tablet Take 15 mg by mouth at bedtime.     [provider]  pravastatin (PRAVACHOL) 80 MG tablet Take 80 mg by mouth every evening.    [provider]  promethazine (PHENERGAN) 25 MG suppository Place rectally. 07/26/17 08/15/17  [provider]  ranitidine (ZANTAC) 150 MG tablet Take 1 tablet (150 mg total) by mouth 2 (two) times daily. 07/23/17 07/23/18  Merlyn Lot, MD  sucralfate (CARAFATE) 1 GM/10ML suspension Take 10 mLs (1 g total) by mouth 4 (four) times daily -  with meals and at bedtime. 07/20/17   Daleen Bo, MD    Physical Exam: Vitals:   02/29/20 1954 02/29/20 1955 02/29/20 2300 03/01/20 0009  BP: (!) 173/77  (!) 164/70 (!) 155/73  Pulse: 92  (!) 59 61  Resp: 18  18   Temp: 98.6 F (37 C)   98.4 F (36.9 C)  TempSrc:    Oral  SpO2: 98%  100% 100%  Weight:  86.2 kg  78.5 kg  Height:  6' 1"  (1.854 m)  6' 1"  (1.854 m)    Constitutional: NAD, comfortable Eyes: pupils equal and reactive to light, anicteric, without injection ENMT: MMM, throat without exudates or erythema Neck: normal, supple, no masses, no thyromegaly noted Respiratory: CTAB, nwob  Cardiovascular: rrr w/o mrg, warm extremities Abdomen: NBS, NT,   Musculoskeletal: moving all 4 extremities, strength grossly intact 5/5 in the UE and LE's, DTR's  Skin: Third toe is enlarged in erythematous and warm, at the end of his third toe there is an ulcer Neurologic: CN 2-12 grossly intact. Sensation intact intact to light touch in the lower extremities he denies any numbness Psychiatric: AO appearing, mentation appropriate   Labs on  Admission: I have personally reviewed following labs and imaging studies  CBC: Recent Labs  Lab 02/29/20 2003 03/01/20 0110  WBC 7.7 7.2  NEUTROABS 6.3  --   HGB 11.8* 10.4*  HCT 35.3* 31.7*  MCV 90.7 90.6  PLT 291 161   Basic Metabolic Panel: Recent Labs  Lab 02/29/20 2003 03/01/20 0110  NA 137 138  K 3.7 3.9  CL 102 104  CO2 22 28  GLUCOSE 167* 173*  BUN 24* 25*  CREATININE 1.06 0.87  CALCIUM 9.3 8.9   GFR: Estimated Creatinine Clearance: 84 mL/min (by C-G formula based on SCr of 0.87 mg/dL). Liver Function Tests: Recent Labs  Lab 02/29/20 2003  AST 18  ALT 16  ALKPHOS 108  BILITOT 1.2  PROT 7.8  ALBUMIN 4.0   No results for input(s): LIPASE, AMYLASE in the last 168 hours. No results for input(s): AMMONIA in the last  168 hours. Coagulation Profile: Recent Labs  Lab 03/01/20 0110  INR 1.1   Cardiac Enzymes: No results for input(s): CKTOTAL, CKMB, CKMBINDEX, TROPONINI in the last 168 hours. BNP (last 3 results) No results for input(s): PROBNP in the last 8760 hours. HbA1C: No results for input(s): HGBA1C in the last 72 hours. CBG: Recent Labs  Lab 03/01/20 0059  GLUCAP 157*   Lipid Profile: No results for input(s): CHOL, HDL, LDLCALC, TRIG, CHOLHDL, LDLDIRECT in the last 72 hours. Thyroid Function Tests: No results for input(s): TSH, T4TOTAL, FREET4, T3FREE, THYROIDAB in the last 72 hours. Anemia Panel: No results for input(s): VITAMINB12, FOLATE, FERRITIN, TIBC, IRON, RETICCTPCT in the last 72 hours. Urine analysis:    Component Value Date/Time   COLORURINE YELLOW (A) 07/23/2017 1249   APPEARANCEUR HAZY (A) 07/23/2017 1249   APPEARANCEUR Cloudy 04/08/2014 1251   LABSPEC 1.023 07/23/2017 1249   LABSPEC 1.029 04/08/2014 1251   PHURINE 5.0 07/23/2017 1249   GLUCOSEU NEGATIVE 07/23/2017 1249   GLUCOSEU 150 mg/dL 04/08/2014 1251   HGBUR NEGATIVE 07/23/2017 1249   BILIRUBINUR NEGATIVE 07/23/2017 1249   BILIRUBINUR Negative 04/08/2014 1251    KETONESUR NEGATIVE 07/23/2017 1249   PROTEINUR 100 (A) 07/23/2017 1249   NITRITE NEGATIVE 07/23/2017 1249   LEUKOCYTESUR NEGATIVE 07/23/2017 1249   LEUKOCYTESUR Negative 04/08/2014 1251    Radiological Exams on Admission: DG Foot Complete Right  Result Date: 02/29/2020 CLINICAL DATA:  Infection. Right foot pain and swelling. Wound on the third toe. EXAM: RIGHT FOOT COMPLETE - 3+ VIEW COMPARISON:  February 05, 2005 FINDINGS: There is soft tissue swelling about the third digit. There are erosions involving the distal phalanx of the third digit. There are degenerative changes of the interphalangeal joints, greatest within the fourth and fifth digits. There are degenerative changes of the midfoot. There is a small plantar calcaneal spur. Vascular calcifications are noted. IMPRESSION: 1. Erosions involving the distal phalanx of the third digit concerning for osteomyelitis. 2. Soft tissue swelling about the third digit. 3. Degenerative changes of the interphalangeal joints and midfoot. Electronically Signed   By: Constance Holster M.D.   On: 02/29/2020 47:65    EKG: Not applicable  Assessment/Plan Principal Problem:   Osteomyelitis (HCC) Active Problems:   Atherosclerosis of abdominal aorta (HCC)   GERD (gastroesophageal reflux disease)   HTN (hypertension), benign   Hyperlipidemia, unspecified   Type 2 diabetes mellitus with stage 2 chronic kidney disease ([place])  Alex Lame. is a 74 y.o. male with a pertinent history of GERD, hypertension, diabetes who presents to the [facility] ED with a toe infection found to have newly diagnosed osteomyelitis, diabetic related  3rd Toe Osteomyelitis, because the patient is not septic will hold on abx at this time and await the podiatry team to consider biopsy  I told the patient to let mek now if he becomes septic as this would then be an indication for abx Follow-up ESR and CRP Await osteomyelitis bone culture prior to abx If septic then start vanc,  cefepime and flagyl Consult ortho/foot team. F/u blood cultures Oxycodone 2.5 mg as needed every 6 hours for severe pain  DM - SSI, hold orals and home medicines, a1c  Chronic Conditions.   Hypertension-creatinine is stable at 1.1 and he needs to be med rec'ed to restart his medicines. GERD Hyperlipidemia-continue pravastatin, aspirin 81 mg  Patient and/or Family completely agreed with the plan, expressed understanding and I answered all questions.  DVT prophylaxis: Heparin SQ Code Status: Full code  Family Communication: Not applicable Disposition Plan: Likely home when antibiotics are deescalated, if antibiotics are indicated Consults called: Podiatry, talk with Samara Deist Admission status: Inpatient because of osteomyelitis and need eventually for intervention and/or IV antibiotics   A total of 65 minutes utilized during this admission.  [facility]   If 7PM-7AM, please contact night-coverage [email or web address] Password [facility]  03/01/2020, 2:34 AM

## 2020-03-01 ENCOUNTER — Encounter
Admission: EM | Disposition: A | Discharge status: Home / Self Care | Payer: Self-pay | Source: Home / Self Care | Attending: Internal Medicine

## 2020-03-01 ENCOUNTER — Inpatient Hospital Stay: Payer: Medicare HMO | Admitting: Certified Registered"

## 2020-03-01 ENCOUNTER — Encounter: Payer: Self-pay | Admitting: Internal Medicine

## 2020-03-01 DIAGNOSIS — I10 Essential (primary) hypertension: Secondary | ICD-10-CM

## 2020-03-01 DIAGNOSIS — E1165 Type 2 diabetes mellitus with hyperglycemia: Secondary | ICD-10-CM

## 2020-03-01 DIAGNOSIS — M869 Osteomyelitis, unspecified: Secondary | ICD-10-CM

## 2020-03-01 DIAGNOSIS — E11622 Type 2 diabetes mellitus with other skin ulcer: Secondary | ICD-10-CM

## 2020-03-01 DIAGNOSIS — L97909 Non-pressure chronic ulcer of unspecified part of unspecified lower leg with unspecified severity: Secondary | ICD-10-CM

## 2020-03-01 HISTORY — PX: AMPUTATION TOE: SHX6595

## 2020-03-01 LAB — BASIC METABOLIC PANEL
Anion gap: 6 (ref 5–15)
BUN: 25 mg/dL — ABNORMAL HIGH (ref 8–23)
CO2: 28 mmol/L (ref 22–32)
Calcium: 8.9 mg/dL (ref 8.9–10.3)
Chloride: 104 mmol/L (ref 98–111)
Creatinine, Ser: 0.87 mg/dL (ref 0.61–1.24)
GFR calc Af Amer: >60 mL/min (ref >60)
GFR calc non Af Amer: >60 mL/min (ref >60)
Glucose, Bld: 173 mg/dL — ABNORMAL HIGH (ref 70–99)
Potassium: 3.9 mmol/L (ref 3.5–5.1)
Sodium: 138 mmol/L (ref 135–145)

## 2020-03-01 LAB — GLUCOSE, CAPILLARY
Glucose-Capillary: 157 mg/dL — ABNORMAL HIGH (ref 70–99)
Glucose-Capillary: 114 mg/dL — ABNORMAL HIGH (ref 70–99)

## 2020-03-01 LAB — CBC
HCT: 31.7 % — ABNORMAL LOW (ref 39.0–52.0)
Hemoglobin: 10.4 g/dL — ABNORMAL LOW (ref 13.0–17.0)
MCH: 29.7 pg (ref 26.0–34.0)
MCHC: 32.8 g/dL (ref 30.0–36.0)
MCV: 90.6 fL (ref 80.0–100.0)
Platelets: 256 10*3/uL (ref 150–400)
RBC: 3.5 MIL/uL — ABNORMAL LOW (ref 4.22–5.81)
RDW: 13.4 % (ref 11.5–15.5)
WBC: 7.2 10*3/uL (ref 4.0–10.5)
nRBC: 0 % (ref 0.0–0.2)

## 2020-03-01 LAB — C-REACTIVE PROTEIN: CRP: 8.1 mg/dL — ABNORMAL HIGH (ref <1.0)

## 2020-03-01 LAB — RESPIRATORY PANEL BY RT PCR (FLU A&B, COVID)
Influenza A by PCR: NEGATIVE
Influenza B by PCR: NEGATIVE
SARS Coronavirus 2 by RT PCR: NEGATIVE

## 2020-03-01 LAB — PROTIME-INR
INR: 1.1 (ref 0.8–1.2)
Prothrombin Time: 13.5 seconds (ref 11.4–15.2)

## 2020-03-01 LAB — HEMOGLOBIN A1C
Hgb A1c MFr Bld: 7.6 % — ABNORMAL HIGH (ref 4.8–5.6)
Mean Plasma Glucose: 171.42 mg/dL

## 2020-03-01 SURGERY — AMPUTATION, TOE
Anesthesia: General | Site: Toe | Laterality: Right

## 2020-03-01 MED ORDER — ACETAMINOPHEN 650 MG RE SUPP: 650.0000 mg | Freq: Four times a day (QID) | RECTAL | Status: DC | PRN

## 2020-03-01 MED ORDER — PROPOFOL 10 MG/ML IV BOLUS
INTRAVENOUS | Status: DC | PRN
  Administered 2020-03-01: 30 mg via INTRAVENOUS

## 2020-03-01 MED ORDER — MIDAZOLAM HCL 5 MG/5ML IJ SOLN
INTRAMUSCULAR | Status: DC | PRN
  Administered 2020-03-01 (×2): 1 mg via INTRAVENOUS

## 2020-03-01 MED ORDER — FENTANYL CITRATE (PF) 100 MCG/2ML IJ SOLN
INTRAMUSCULAR | Status: AC
  Filled 2020-03-01: qty 2

## 2020-03-01 MED ORDER — MIDAZOLAM HCL 2 MG/2ML IJ SOLN
INTRAMUSCULAR | Status: AC
  Filled 2020-03-01: qty 2

## 2020-03-01 MED ORDER — BUPIVACAINE HCL 0.5 % IJ SOLN
INTRAMUSCULAR | Status: DC | PRN
  Administered 2020-03-01: 10 mL

## 2020-03-01 MED ORDER — CEFAZOLIN SODIUM-DEXTROSE 2-4 GM/100ML-% IV SOLN
2.0000 g | Freq: Three times a day (TID) | INTRAVENOUS | Status: DC
  Administered 2020-03-01 – 2020-03-02 (×3): 2 g via INTRAVENOUS
  Filled 2020-03-01 (×6): qty 100

## 2020-03-01 MED ORDER — LACTATED RINGERS IV SOLN: INTRAVENOUS | Status: DC | PRN

## 2020-03-01 MED ORDER — OXYCODONE HCL 5 MG PO TABS
2.5000 mg | ORAL_TABLET | Freq: Four times a day (QID) | ORAL | Status: DC | PRN
  Administered 2020-03-01 – 2020-03-02 (×3): 2.5 mg via ORAL
  Filled 2020-03-01 (×3): qty 1

## 2020-03-01 MED ORDER — SODIUM CHLORIDE 0.9% FLUSH
3.0000 mL | Freq: Two times a day (BID) | INTRAVENOUS | Status: DC
  Administered 2020-03-01 – 2020-03-02 (×4): 3 mL via INTRAVENOUS

## 2020-03-01 MED ORDER — PANTOPRAZOLE SODIUM 40 MG PO TBEC
40.0000 mg | DELAYED_RELEASE_TABLET | Freq: Every day | ORAL | Status: DC
  Administered 2020-03-01 – 2020-03-02 (×2): 40 mg via ORAL
  Filled 2020-03-01 (×2): qty 1

## 2020-03-01 MED ORDER — ASPIRIN 81 MG PO CHEW
81.0000 mg | CHEWABLE_TABLET | Freq: Every day | ORAL | Status: DC
  Administered 2020-03-01 (×2): 81 mg via ORAL
  Filled 2020-03-01 (×2): qty 1

## 2020-03-01 MED ORDER — ACETAMINOPHEN 325 MG PO TABS
650.0000 mg | ORAL_TABLET | Freq: Four times a day (QID) | ORAL | Status: DC | PRN
  Filled 2020-03-01: qty 2

## 2020-03-01 MED ORDER — FENTANYL CITRATE (PF) 100 MCG/2ML IJ SOLN
INTRAMUSCULAR | Status: DC | PRN
  Administered 2020-03-01 (×2): 50 ug via INTRAVENOUS

## 2020-03-01 MED ORDER — PRAVASTATIN SODIUM 20 MG PO TABS
80.0000 mg | ORAL_TABLET | Freq: Every evening | ORAL | Status: DC
  Administered 2020-03-01: 80 mg via ORAL
  Filled 2020-03-01: qty 4

## 2020-03-01 MED ORDER — HEPARIN SODIUM (PORCINE) 5000 UNIT/ML IJ SOLN
5000.0000 [IU] | Freq: Three times a day (TID) | INTRAMUSCULAR | Status: DC
  Administered 2020-03-01 – 2020-03-02 (×4): 5000 [IU] via SUBCUTANEOUS
  Filled 2020-03-01 (×4): qty 1

## 2020-03-01 MED ORDER — VANCOMYCIN HCL 2000 MG/400ML IV SOLN
2000.0000 mg | INTRAVENOUS | Status: DC
  Filled 2020-03-01: qty 400

## 2020-03-01 MED ORDER — VANCOMYCIN HCL 2000 MG/400ML IV SOLN
2000.0000 mg | Freq: Once | INTRAVENOUS | Status: AC
  Administered 2020-03-01: 21:00:00 2000 mg via INTRAVENOUS
  Filled 2020-03-01: qty 400

## 2020-03-01 SURGICAL SUPPLY — 43 items
BLADE OSC/SAGITTAL MD 5.5X18 (BLADE) IMPLANT
BLADE SURG MINI STRL (BLADE) ×2 IMPLANT
BNDG CONFORM 2 STRL LF (GAUZE/BANDAGES/DRESSINGS) ×2 IMPLANT
BNDG CONFORM 3 STRL LF (GAUZE/BANDAGES/DRESSINGS) ×4 IMPLANT
BNDG ELASTIC 4X5.8 VLCR NS LF (GAUZE/BANDAGES/DRESSINGS) ×2 IMPLANT
BNDG ESMARK 4X12 TAN STRL LF (GAUZE/BANDAGES/DRESSINGS) ×2 IMPLANT
BNDG GAUZE 4.5X4.1 6PLY STRL (MISCELLANEOUS) ×2 IMPLANT
CANISTER SUCT 1200ML W/VALVE (MISCELLANEOUS) ×2 IMPLANT
COVER WAND RF STERILE (DRAPES) ×2 IMPLANT
CUFF TOURN SGL QUICK 12 (TOURNIQUET CUFF) IMPLANT
CUFF TOURN SGL QUICK 18X4 (TOURNIQUET CUFF) IMPLANT
DRAPE FLUOR MINI C-ARM 54X84 (DRAPES) ×2 IMPLANT
DRAPE XRAY CASSETTE 23X24 (DRAPES) IMPLANT
DURAPREP 26ML APPLICATOR (WOUND CARE) ×2 IMPLANT
ELECT REM PT RETURN 9FT ADLT (ELECTROSURGICAL) ×2
ELECTRODE REM PT RTRN 9FT ADLT (ELECTROSURGICAL) ×1 IMPLANT
GAUZE PACKING IODOFORM 1/2 (PACKING) IMPLANT
GAUZE SPONGE 4X4 12PLY STRL (GAUZE/BANDAGES/DRESSINGS) ×2 IMPLANT
GAUZE XEROFORM 1X8 LF (GAUZE/BANDAGES/DRESSINGS) ×2 IMPLANT
GLOVE BIO SURGEON STRL SZ7.5 (GLOVE) ×2 IMPLANT
GLOVE INDICATOR 8.0 STRL GRN (GLOVE) ×2 IMPLANT
GOWN STRL REUS W/ TWL LRG LVL3 (GOWN DISPOSABLE) ×2 IMPLANT
GOWN STRL REUS W/TWL LRG LVL3 (GOWN DISPOSABLE) ×2
KIT TURNOVER KIT A (KITS) ×2 IMPLANT
LABEL OR SOLS (LABEL) ×2 IMPLANT
NEEDLE FILTER BLUNT 18X 1/2SAF (NEEDLE) ×1
NEEDLE FILTER BLUNT 18X1 1/2 (NEEDLE) ×1 IMPLANT
NEEDLE HYPO 25X1 1.5 SAFETY (NEEDLE) ×2 IMPLANT
NS IRRIG 500ML POUR BTL (IV SOLUTION) ×2 IMPLANT
PACK EXTREMITY (MISCELLANEOUS) ×2 IMPLANT
PAD ABD DERMACEA PRESS 5X9 (GAUZE/BANDAGES/DRESSINGS) ×4 IMPLANT
PULSAVAC PLUS IRRIG FAN TIP (DISPOSABLE) ×2
SHIELD FULL FACE ANTIFOG 7M (MISCELLANEOUS) ×2 IMPLANT
SOL .9 NS 3000ML IRR  AL (IV SOLUTION) ×1
SOL .9 NS 3000ML IRR UROMATIC (IV SOLUTION) ×1 IMPLANT
STOCKINETTE M/LG 89821 (MISCELLANEOUS) ×2 IMPLANT
STRAP SAFETY 5IN WIDE (MISCELLANEOUS) ×2 IMPLANT
SUT ETHILON 3-0 FS-10 30 BLK (SUTURE) ×2
SUT ETHILON 5-0 FS-2 18 BLK (SUTURE) ×2 IMPLANT
SUT VIC AB 4-0 FS2 27 (SUTURE) ×2 IMPLANT
SUTURE EHLN 3-0 FS-10 30 BLK (SUTURE) ×1 IMPLANT
SYR 10ML LL (SYRINGE) ×6 IMPLANT
TIP FAN IRRIG PULSAVAC PLUS (DISPOSABLE) ×1 IMPLANT

## 2020-03-01 NOTE — Addendum Note (Signed)
Addendum  created 03/01/20 1821 by Yves Dill, MD   Order list changed, Order sets accessed

## 2020-03-01 NOTE — Transfer of Care (Signed)
Immediate Anesthesia Transfer of Care Note  Patient: Alex KRIST Sr.  Procedure(s) Performed: AMPUTATION TOE (Right Toe)  Patient Location: PACU  Anesthesia Type:MAC  Level of Consciousness: awake, alert  and oriented  Airway & Oxygen Therapy: Patient Spontanous Breathing  Post-op Assessment: Report given to RN and Post -op Vital signs reviewed and stable  Post vital signs: Reviewed and stable  Last Vitals:  Vitals Value Taken Time  BP    Temp    Pulse    Resp    SpO2      Last Pain:  Vitals:   03/01/20 1559  TempSrc: Oral  PainSc:          Complications: No apparent anesthesia complications

## 2020-03-01 NOTE — Consult Note (Signed)
Pharmacy Antibiotic Note  Alex Soto. is a 74 y.o. male admitted on 02/29/2020 with Osteomyelitis.  Pharmacy has been consulted for Cefazolin/Vancomycin dosing.  Plan: 1. Cefazolin 2g Q8H  2. Vancomycin 2g x1 loading dose, followed by Vancomycin 2g Q24H. Will check Scr with AM labs and adjust Vancomycin accordingly.   Height: 6\' 1"  (185.4 cm) Weight: 78.5 kg (173 lb 1.6 oz) IBW/kg (Calculated) : 79.9  Temp (24hrs), Avg:98.6 F (37 C), Min:97.7 F (36.5 C), Max:99.4 F (37.4 C)  Recent Labs  Lab 02/29/20 2003 02/29/20 2242 03/01/20 0110  WBC 7.7  --  7.2  CREATININE 1.06  --  0.87  LATICACIDVEN 1.8 1.2  --     Estimated Creatinine Clearance: 84 mL/min (by C-G formula based on SCr of 0.87 mg/dL).    No Known Allergies   Thank you for allowing pharmacy to be a part of this patient's care.  05/01/20 03/01/2020 7:15 PM

## 2020-03-01 NOTE — Progress Notes (Signed)
PROGRESS NOTE    [place]  VPX:[id number] DOB: [birth year] DOA: 02/29/2020 PCP: Kirk Ruths, MD   Chief Complaint  Patient presents with  . Wound Infection    Brief Narrative: 74 year old male with history of uncontrolled diabetes mellitus not on insulin, hypertension and GERD presented with redness and erythema of the right 3rd toe.  Reportedly started after ripping out hangnail of which took a flap of the skin with it.  Was noted to have an ulceration on the bottom of the toe.  X-ray done in the ED was concerning for osteomyelitis. Admitted for further management and podiatry consulted.  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  Noted for diabetic foot ulcer with full thickness to the bone.  Holding antibiotic for surgical debridement. Seen by podiatry and recommend surgical debridement with amputation of the distal 3rd toe.  We will start him on antibiotic post surgery.   active Problems:   Type 2 diabetes mellitus with stage 2 chronic kidney disease ([place]) Uncontrolled with A1c of 7.6.  Monitor on sliding scale coverage.  Will counsel on tight blood glucose control as outpatient.  Essential hypertension Resume home meds.  Hyperlipidemia Continue statin.        DVT prophylaxis: Subcu Code Status: Full code Family Communication: We will update his wife Disposition:   Status is: Inpatient  Remains inpatient appropriate because:IV treatments appropriate due to intensity of illness or inability to take PO.  Needs surgical intervention, IV antibiotic.Marland Kitchen  PT evaluation.  Patient continues to be monitored for >2 midnight.   Dispo: The patient is from: Home              Anticipated d/c is to: Home              Anticipated d/c date is: 2 days              Patient currently is not medically stable to d/c.        Consultants:   Podiatry  (Dr. Vickki Muff)   Procedures: For surgery today.  Antimicrobials: None  Subjective: Seen and examined.  Denies any pain in the right toe or foot.  Objective: Vitals:   02/29/20 2300 03/01/20 0009 03/01/20 0722 03/01/20 1119  BP: (!) 164/70 (!) 155/73 134/64 (!) 142/66  Pulse: (!) 59 61 (!) 59 (!) 59  Resp: 18     Temp:  98.4 F (36.9 C) 99.4 F (37.4 C) 98.5 F (36.9 C)  TempSrc:  Oral Oral Oral  SpO2: 100% 100% 98% 99%  Weight:  78.5 kg    Height:  6\' 1"  (1.854 m)      Intake/Output Summary (Last 24 hours) at 03/01/2020 1233 Last data filed at 03/01/2020 1025 Gross per 24 hour  Intake 240 ml  Output 225 ml  Net 15 ml   Filed Weights   02/29/20 1955 03/01/20 0009  Weight: 86.2 kg 78.5 kg    Examination:  General: Not in distress HEENT: Moist mucosa, supple neck Chest: Clear CVs: Normal S1-S2 GI: Soft, nondistended, nontender Musculoskeletal: Clean dressing over the right foot, significant ulceration and discoloration over the base of the right 3rd toe extending to the bone.  (As seen on podiatrist note)   Data Reviewed: I have personally reviewed following labs and imaging studies  CBC: Recent Labs  Lab 02/29/20 2003 03/01/20 0110  WBC 7.7 7.2  NEUTROABS 6.3  --   HGB 11.8* 10.4*  HCT 35.3* 31.7*  MCV 90.7 90.6  PLT 291 256    Basic Metabolic Panel: Recent Labs  Lab 02/29/20 2003 03/01/20 0110  NA 137 138  K 3.7 3.9  CL 102 104  CO2 22 28  GLUCOSE 167* 173*  BUN 24* 25*  CREATININE 1.06 0.87  CALCIUM 9.3 8.9    GFR: Estimated Creatinine Clearance: 84 mL/min (by C-G formula based on SCr of 0.87 mg/dL).  Liver Function Tests: Recent Labs  Lab 02/29/20 2003  AST 18  ALT 16  ALKPHOS 108  BILITOT 1.2  PROT 7.8  ALBUMIN 4.0    CBG: Recent Labs  Lab 03/01/20 0059  GLUCAP 157*     Recent Results (from the past 240 hour(s))  Blood culture (routine x 2)     Status: None (Preliminary result)   Collection Time: 02/29/20  8:03 PM    Specimen: BLOOD  Result Value Ref Range Status   Specimen Description BLOOD LEFT ARM  Final   Special Requests   Final    BOTTLES DRAWN AEROBIC AND ANAEROBIC Blood Culture results may not be optimal due to an excessive volume of blood received in culture bottles   Culture   Final    NO GROWTH < 12 HOURS Performed at [facility], [address], [place], [place] [address]    Report Status PENDING  Incomplete  Blood culture (routine x 2)     Status: None (Preliminary result)   Collection Time: 02/29/20 10:40 PM   Specimen: BLOOD  Result Value Ref Range Status   Specimen Description BLOOD BLOOD LEFT FOREARM  Final   Special Requests   Final    BOTTLES DRAWN AEROBIC AND ANAEROBIC Blood Culture results may not be optimal due to an excessive volume of blood received in culture bottles   Culture   Final    NO GROWTH < 12 HOURS Performed at [facility], [address], [place], [place] [address]    Report Status PENDING  Incomplete  Respiratory Panel by RT PCR (Flu A&B, Covid) - Nasopharyngeal Swab     Status: None   Collection Time: 02/29/20 10:42 PM   Specimen: Nasopharyngeal Swab  Result Value Ref Range Status   SARS Coronavirus 2 by RT PCR NEGATIVE NEGATIVE Final    Comment: (NOTE) SARS-CoV-2 target nucleic acids are NOT DETECTED. The SARS-CoV-2 RNA is generally detectable in upper respiratoy specimens during the acute phase of infection. The lowest concentration of SARS-CoV-2 viral copies this assay can detect is 131 copies/mL. A negative result does not preclude SARS-Cov-2 infection and should not be used as the sole basis for treatment or other patient management decisions. A negative result may occur with  improper specimen collection/handling, submission of specimen other than nasopharyngeal swab, presence of viral mutation(s) within the areas targeted by this assay, and inadequate number of viral copies (<131 copies/mL). A negative result must be combined  with clinical observations, patient history, and epidemiological information. The expected result is Negative. Fact Sheet for Patients:  https://www.moore.com/ Fact Sheet for Healthcare Providers:  https://www.young.biz/ This test is not yet ap proved or cleared by the [place] FDA and  has been authorized for detection and/or diagnosis of SARS-CoV-2 by FDA under an Emergency Use Authorization (EUA). This EUA will remain  in effect (meaning this test can be used) for the duration of the COVID-19 declaration under Section 564(b)(1) of the Act, 21 U.S.C. section 360bbb-3(b)(1), unless  the authorization is terminated or revoked sooner.    Influenza A by PCR NEGATIVE NEGATIVE Final   Influenza B by PCR NEGATIVE NEGATIVE Final    Comment: (NOTE) The Xpert Xpress SARS-CoV-2/FLU/RSV assay is intended as an aid in  the diagnosis of influenza from Nasopharyngeal swab specimens and  should not be used as a sole basis for treatment. Nasal washings and  aspirates are unacceptable for Xpert Xpress SARS-CoV-2/FLU/RSV  testing. Fact Sheet for Patients: https://www.moore.com/ Fact Sheet for Healthcare Providers: https://www.young.biz/ This test is not yet approved or cleared by the [place] FDA and  has been authorized for detection and/or diagnosis of SARS-CoV-2 by  FDA under an Emergency Use Authorization (EUA). This EUA will remain  in effect (meaning this test can be used) for the duration of the  Covid-19 declaration under Section 564(b)(1) of the Act, 21  U.S.C. section 360bbb-3(b)(1), unless the authorization is  terminated or revoked. Performed at [facility], [address] [place], [place] [address]          Radiology Studies: DG Foot Complete Right  Result Date: 02/29/2020 CLINICAL DATA:  Infection. Right foot pain and swelling. Wound on the third toe. EXAM: RIGHT FOOT COMPLETE -  3+ VIEW COMPARISON:  February 05, 2005 FINDINGS: There is soft tissue swelling about the third digit. There are erosions involving the distal phalanx of the third digit. There are degenerative changes of the interphalangeal joints, greatest within the fourth and fifth digits. There are degenerative changes of the midfoot. There is a small plantar calcaneal spur. Vascular calcifications are noted. IMPRESSION: 1. Erosions involving the distal phalanx of the third digit concerning for osteomyelitis. 2. Soft tissue swelling about the third digit. 3. Degenerative changes of the interphalangeal joints and midfoot. Electronically Signed   By: Katherine Mantle M.D.   On: 02/29/2020 21:08        Scheduled Meds: . aspirin  81 mg Oral QHS  . heparin  5,000 Units Subcutaneous Q8H  . pantoprazole  40 mg Oral Daily  . pravastatin  80 mg Oral QPM  . sodium chloride flush  3 mL Intravenous Q12H   Continuous Infusions:   LOS: 1 day    Time spent: 35 minutes    Cherilyn Sautter, MD [facility]   To contact the attending provider between 7A-7P or the covering provider during after hours 7P-7A, please log into the web site [email or web address] and access using universal [place] password for that web site. If you do not have the password, please call the hospital operator.  03/01/2020, 12:33 PM

## 2020-03-01 NOTE — Anesthesia Preprocedure Evaluation (Addendum)
Anesthesia Evaluation  Patient identified by MRN, date of birth, ID band Patient awake    Reviewed: Allergy & Precautions, H&P , NPO status , Patient's Chart, lab work & pertinent test results, reviewed documented beta blocker date and time   Airway Mallampati: II  TM Distance: >3 FB Neck ROM: full    Dental  (+) Edentulous Upper, Edentulous Lower   Pulmonary neg pulmonary ROS, former smoker,    Pulmonary exam normal        Cardiovascular hypertension, + Peripheral Vascular Disease  Normal cardiovascular exam Rhythm:regular Rate:Normal     Neuro/Psych negative neurological ROS  negative psych ROS   GI/Hepatic negative GI ROS, Neg liver ROS, GERD  Medicated,  Endo/Other  negative endocrine ROSdiabetes  Renal/GU Renal InsufficiencyRenal diseasenegative Renal ROS  negative genitourinary   Musculoskeletal   Abdominal   Peds negative pediatric ROS (+)  Hematology negative hematology ROS (+) anemia ,   Anesthesia Other Findings Past Medical History: No date: Anemia No date: Diabetes mellitus without complication (HCC) No date: GERD (gastroesophageal reflux disease) No date: History of kidney stones No date: Kidney stones Past Surgical History: No date: APPENDECTOMY No date: EYE SURGERY     Comment: age 74 No date: KIDNEY STONE SURGERY No date: TONSILLECTOMY BMI    Body Mass Index:  24.67 kg/m     Reproductive/Obstetrics negative OB ROS                            Anesthesia Physical  Anesthesia Plan  ASA: III  Anesthesia Plan: General   Post-op Pain Management:  Regional for Post-op pain   Induction: Intravenous  PONV Risk Score and Plan: Propofol infusion  Airway Management Planned: Nasal Cannula  Additional Equipment:   Intra-op Plan:   Post-operative Plan:   Informed Consent: I have reviewed the patients History and Physical, chart, labs and discussed the procedure  including the risks, benefits and alternatives for the proposed anesthesia with the patient or authorized representative who has indicated his/her understanding and acceptance.     Dental Advisory Given  Plan Discussed with: CRNA  Anesthesia Plan Comments:        Anesthesia Quick Evaluation

## 2020-03-01 NOTE — Addendum Note (Signed)
Addendum  created 03/01/20 2215 by Kooper Chriswell, Anne Ng, CRNA   Intraprocedure Event edited, Intraprocedure Staff edited

## 2020-03-01 NOTE — Progress Notes (Signed)
Patient to surgery. No s/s of distress noted.

## 2020-03-01 NOTE — Anesthesia Postprocedure Evaluation (Signed)
Anesthesia Post Note  Patient: Alex Soto.  Procedure(s) Performed: AMPUTATION TOE (Right Toe)  Patient location during evaluation: PACU Anesthesia Type: General Level of consciousness: awake and alert and oriented Pain management: pain level controlled Vital Signs Assessment: post-procedure vital signs reviewed and stable Respiratory status: spontaneous breathing Cardiovascular status: blood pressure returned to baseline Anesthetic complications: no     Last Vitals:  Vitals:   03/01/20 1559 03/01/20 1811  BP: 137/74 (!) 156/77  Pulse: 71 72  Resp: 16 13  Temp: 37.4 C 36.6 C  SpO2: 99% 99%    Last Pain:  Vitals:   03/01/20 1811  TempSrc:   PainSc: 0-No pain                 Tyniah Kastens

## 2020-03-01 NOTE — Consult Note (Addendum)
ORTHOPAEDIC CONSULTATION  REQUESTING PHYSICIAN: Eddie North, MD  Chief Complaint: Right third toe infection  HPI: Alex Vaca. is a 74 y.o. male who complains of worsening infection right third toe.  He states about a week ago he trimmed his nail and some skin was noted on the tip of the toe.  He states since that time however the last 3 days he has become more swollen and red.  Denies any current fever or chills.  No pain to the area.  He states he has not noticed any numbness to his feet.  Seen by his primary care provider Dr. Einar Crow on a routine regular basis.  Last hemoglobin A1c on April 8 was 8.1.     Past Medical History:  Diagnosis Date  . Anemia   . Diabetes mellitus without complication (HCC)   . GERD (gastroesophageal reflux disease)   . History of kidney stones   . Kidney stones    Past Surgical History:  Procedure Laterality Date  . APPENDECTOMY    . EYE SURGERY     age 19  . KIDNEY STONE SURGERY    . SHOULDER ARTHROSCOPY WITH BICEPS TENDON REPAIR Right 10/06/2016   Procedure: SHOULDER ARTHROSCOPY WITH BICEPS TENDON REPAIR;  Surgeon: Christena Flake, MD;  Location: [facility];  Service: Orthopedics;  Laterality: Right;  . SHOULDER ARTHROSCOPY WITH OPEN ROTATOR CUFF REPAIR Right 10/06/2016   Procedure: SHOULDER ARTHROSCOPY WITH OPEN ROTATOR CUFF REPAIR;  Surgeon: Christena Flake, MD;  Location: [facility];  Service: Orthopedics;  Laterality: Right;  . SHOULDER ARTHROSCOPY WITH SUBACROMIAL DECOMPRESSION Right 10/06/2016   Procedure: SHOULDER ARTHROSCOPY WITH SUBACROMIAL DECOMPRESSION AND DEBRIDEMENT;  Surgeon: Christena Flake, MD;  Location: [facility];  Service: Orthopedics;  Laterality: Right;  . TONSILLECTOMY     Social History   Socioeconomic History  . Marital status: Married    Spouse name: Not on file  . Number of children: Not on file  . Years of education: Not on file  . Highest education level: Not on file  Occupational History  . Not on file   Tobacco Use  . Smoking status: Former Smoker    Packs/day: 0.25    Years: 10.00    Pack years: 2.50    Types: Cigarettes    Quit date: 09/30/1969    Years since quitting: 50.4  . Smokeless tobacco: Former Engineer, water and Sexual Activity  . Alcohol use: No  . Drug use: No  . Sexual activity: Not on file  Other Topics Concern  . Not on file  Social History Narrative  . Not on file   Social Determinants of Health   Financial Resource Strain:   . Difficulty of Paying Living Expenses:   Food Insecurity:   . Worried About Programme researcher, broadcasting/film/video in the Last Year:   . Barista in the Last Year:   Transportation Needs:   . Freight forwarder (Medical):   Marland Kitchen Lack of Transportation (Non-Medical):   Physical Activity:   . Days of Exercise per Week:   . Minutes of Exercise per Session:   Stress:   . Feeling of Stress :   Social Connections:   . Frequency of Communication with Friends and Family:   . Frequency of Social Gatherings with Friends and Family:   . Attends Religious Services:   . Active Member of Clubs or Organizations:   . Attends Banker Meetings:   Marland Kitchen Marital Status:  History reviewed. No pertinent family history. No Known Allergies Prior to Admission medications   Medication Sig Start Date End Date Taking? Authorizing Provider  acetaminophen (TYLENOL) 500 MG tablet Take 1,000 mg by mouth every 6 (six) hours as needed.    [provider]  amLODipine (NORVASC) 10 MG tablet Take 10 mg by mouth at bedtime. 05/23/17 05/23/18  [provider]  aspirin 81 MG tablet Take 81 mg by mouth at bedtime.     [provider]  cyclobenzaprine (FLEXERIL) 10 MG tablet TAKE 1/2 TO 1 TABLET BY MOUTH THREE TIMES A DAY AS NEEDED FOR MUSCLE SPASMS 06/17/17   [provider]  fluticasone (FLONASE) 50 MCG/ACT nasal spray Place 1 spray into both nostrils daily as needed for allergies or rhinitis.    [provider]   glyBURIDE-metformin (GLUCOVANCE) 2.5-500 MG tablet Take 1 tablet by mouth at bedtime.     [provider]  lactulose (CHRONULAC) 10 GM/15ML solution take 30 milliliters by mouth three times a day for 10 days 07/27/17   [provider]  lisinopril-hydrochlorothiazide (PRINZIDE,ZESTORETIC) 20-12.5 MG tablet Take 1 tablet by mouth at bedtime. 07/11/17   [provider]  omeprazole (PRILOSEC) 20 MG capsule Take 20 mg by mouth as needed.    [provider]  oxyCODONE (ROXICODONE) 5 MG immediate release tablet Take 1-2 tablets (5-10 mg total) by mouth every 4 (four) hours as needed for severe pain. 10/06/16   Poggi, Marshall Cork, MD  pioglitazone (ACTOS) 15 MG tablet Take 15 mg by mouth at bedtime.     [provider]  pravastatin (PRAVACHOL) 80 MG tablet Take 80 mg by mouth every evening.    [provider]  promethazine (PHENERGAN) 25 MG suppository Place rectally. 07/26/17 08/15/17  [provider]  ranitidine (ZANTAC) 150 MG tablet Take 1 tablet (150 mg total) by mouth 2 (two) times daily. 07/23/17 07/23/18  Merlyn Lot, MD  sucralfate (CARAFATE) 1 GM/10ML suspension Take 10 mLs (1 g total) by mouth 4 (four) times daily -  with meals and at bedtime. 07/20/17   Daleen Bo, MD   DG Foot Complete Right  Result Date: 02/29/2020 CLINICAL DATA:  Infection. Right foot pain and swelling. Wound on the third toe. EXAM: RIGHT FOOT COMPLETE - 3+ VIEW COMPARISON:  February 05, 2005 FINDINGS: There is soft tissue swelling about the third digit. There are erosions involving the distal phalanx of the third digit. There are degenerative changes of the interphalangeal joints, greatest within the fourth and fifth digits. There are degenerative changes of the midfoot. There is a small plantar calcaneal spur. Vascular calcifications are noted. IMPRESSION: 1. Erosions involving the distal phalanx of the third digit concerning for osteomyelitis. 2. Soft tissue swelling  about the third digit. 3. Degenerative changes of the interphalangeal joints and midfoot. Electronically Signed   By: Constance Holster M.D.   On: 02/29/2020 21:08    Positive ROS: All other systems have been reviewed and were otherwise negative with the exception of those mentioned in the HPI and as above.  12 point ROS was performed.  Physical Exam: General: Alert and oriented.  No apparent distress.  Vascular:  Left foot:Dorsalis Pedis:  present Posterior Tibial:  present  Right foot: Dorsalis Pedis:  present Posterior Tibial:  present  Neuro:intact gross sensation.  Protective sensation is absent.  No pain with probing of the right third toe wound today.  Derm: Noted erythema to the dorsal right foot.  On the distal aspect  of the right third toe full-thickness ulceration that probes immediately down to the distal phalanx.  Noted severe fibrotic tissue at this time.  Diffuse erythema to the entire right third toe.      Ortho/MS: Hammertoe contracture of the lesser toes.  Right foot is notably edematous compared to the left side.  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  I discussed local wound care which would have a low likelihood of healing and possibly allow for the progression.  I discussed surgical debridement with amputation of the distal aspect of the third toe would be a definitive procedure most likely.  I discussed the risks associated with surgery including anesthetic risk and local risk.  After long discussion the patient has agreed to undergo amputation of the right third toe.  We will plan for this afternoon.  He last ate at 8 AM this  morning.  N.p.o. for now.    Irean Hong, DPM Cell [phone]   03/01/2020 8:37 AM

## 2020-03-01 NOTE — Op Note (Signed)
Operative note   Surgeon:Estanislado Surgeon Armed forces logistics/support/administrative officer: None    Preop diagnosis: Osteomyelitis right third toe    Postop diagnosis: Same    Procedure: Amputation PIPJ right third toe    EBL: Minimal    Anesthesia:local and IV sedation    Hemostasis: None    Specimen: Bone for culture and residual toe for pathology    Complications: None    Operative indications:Alex W Boone Gear. is an 74 y.o. that presents today for surgical intervention.  The risks/benefits/alternatives/complications have been discussed and consent has been given.    Procedure:  Patient was brought into the OR and placed on the operating table in thesupine position. After anesthesia was obtained theright lower extremity was prepped and draped in usual sterile fashion.  Attention was directed to the right distal third toe where 2 skin flaps were made just distal to the PIPJ.  Full-thickness flaps were created dorsal and plantar.  The toe was disarticulated at the PIPJ and removed from the surgical field in toto.  Bleeders were Bovie cauterized.  The wound was flushed with copious amounts of irrigation.  Closure was performed with a 4-0 Vicryl and a 3-0 nylon.  A portion of the distal phalanx was excised and sent for bone culture.  The remaining portion was sent for pathological examination.    Patient tolerated the procedure and anesthesia well.  Was transported from the OR to the PACU with all vital signs stable and vascular status intact. To be discharged per routine protocol.  Will follow up in approximately 1 week in the outpatient clinic.

## 2020-03-02 DIAGNOSIS — E1169 Type 2 diabetes mellitus with other specified complication: Principal | ICD-10-CM

## 2020-03-02 DIAGNOSIS — D649 Anemia, unspecified: Secondary | ICD-10-CM | POA: Diagnosis present

## 2020-03-02 DIAGNOSIS — E11621 Type 2 diabetes mellitus with foot ulcer: Secondary | ICD-10-CM | POA: Diagnosis present

## 2020-03-02 DIAGNOSIS — M869 Osteomyelitis, unspecified: Secondary | ICD-10-CM | POA: Diagnosis present

## 2020-03-02 DIAGNOSIS — L97509 Non-pressure chronic ulcer of other part of unspecified foot with unspecified severity: Secondary | ICD-10-CM

## 2020-03-02 LAB — BASIC METABOLIC PANEL
Anion gap: 7 (ref 5–15)
BUN: 17 mg/dL (ref 8–23)
CO2: 25 mmol/L (ref 22–32)
Calcium: 8.7 mg/dL — ABNORMAL LOW (ref 8.9–10.3)
Chloride: 103 mmol/L (ref 98–111)
Creatinine, Ser: 0.84 mg/dL (ref 0.61–1.24)
GFR calc Af Amer: >60 mL/min (ref >60)
GFR calc non Af Amer: >60 mL/min (ref >60)
Glucose, Bld: 140 mg/dL — ABNORMAL HIGH (ref 70–99)
Potassium: 4.2 mmol/L (ref 3.5–5.1)
Sodium: 135 mmol/L (ref 135–145)

## 2020-03-02 LAB — CBC
HCT: 30.9 % — ABNORMAL LOW (ref 39.0–52.0)
Hemoglobin: 10.4 g/dL — ABNORMAL LOW (ref 13.0–17.0)
MCH: 30.4 pg (ref 26.0–34.0)
MCHC: 33.7 g/dL (ref 30.0–36.0)
MCV: 90.4 fL (ref 80.0–100.0)
Platelets: 255 10*3/uL (ref 150–400)
RBC: 3.42 MIL/uL — ABNORMAL LOW (ref 4.22–5.81)
RDW: 13.3 % (ref 11.5–15.5)
WBC: 5.5 10*3/uL (ref 4.0–10.5)
nRBC: 0 % (ref 0.0–0.2)

## 2020-03-02 LAB — FERRITIN: Ferritin: 147 ng/mL (ref 24–336)

## 2020-03-02 LAB — VITAMIN B12: Vitamin B-12: 144 pg/mL — ABNORMAL LOW (ref 180–914)

## 2020-03-02 LAB — IRON AND TIBC
Iron: 48 ug/dL (ref 45–182)
Saturation Ratios: 29 % (ref 17.9–39.5)
TIBC: 164 ug/dL — ABNORMAL LOW (ref 250–450)
UIBC: 116 ug/dL

## 2020-03-02 MED ORDER — OXYCODONE HCL 5 MG PO TABS: 5.0000 mg | ORAL_TABLET | Freq: Four times a day (QID) | ORAL | 0 refills | Status: DC | PRN

## 2020-03-02 MED ORDER — AMOXICILLIN-POT CLAVULANATE 875-125 MG PO TABS: 1.0000 | ORAL_TABLET | Freq: Two times a day (BID) | ORAL | 0 refills | Status: AC

## 2020-03-02 NOTE — Progress Notes (Signed)
PROGRESS NOTE    Alex FIDALGO Sr.  AUQ:[id number] DOB: [birth year] DOA: 02/29/2020 PCP: [name], MD   Chief Complaint  Patient presents with  . Wound Infection    Brief Narrative: 74 year old male with history of uncontrolled diabetes mellitus not on insulin, hypertension and GERD presented with redness and erythema of the right 3rd toe.  Reportedly started after ripping out hangnail of which took a flap of the skin with it.  Was noted to have an ulceration on the bottom of the toe.  X-ray done in the ED was concerning for osteomyelitis. Admitted for further management and podiatry consulted.  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 Taken to [facility] on 5[facility]2 and underwent amputation of the right third toe PIP joint.  Bone sent for culture.  Started on empiric IV vancomycin and Ancef postop. Pain control as needed.  Applied postop shoe.  Seen by PT but required home health services.   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  Monitor on sliding scale coverage.  Needs tight blood glucose control as outpatient.   Essential hypertension Stable on home meds.  Hyperlipidemia Continue statin.     Anemia, unspecified Check iron panel and B12.    DVT prophylaxis: Subcu Code Status: Full code Family Communication: I will update his wife. Disposition:   Status is: Inpatient  Remains inpatient appropriate because:IV treatments appropriate due to intensity of illness or inability to take PO.  Requiring IV antibiotic, pending wound culture and sensitivity.   Dispo: The patient is from: Home              Anticipated d/c is to: Home              Anticipated d/c date is: 1 day              Patient currently is not medically stable to d/c.        Consultants:   Podiatry (Dr.  Ether Griffins)   Procedures: Right third toe amputation  Antimicrobials: None  Subjective: Seen and examined.  Denies any pain over the right foot  Objective: Vitals:   03/01/20 2251 03/02/20 0418 03/02/20 0728 03/02/20 1128  BP: (!) 144/70 (!) 144/67 138/63 136/67  Pulse: (!) 52 (!) 55 (!) 51 (!) 54  Resp: 16 16 16    Temp: 98.7 F (37.1 C) 98.7 F (37.1 C) 98.5 F (36.9 C) 98.7 F (37.1 C)  TempSrc: Oral Oral Oral Oral  SpO2: 98% 99% 98% 99%  Weight:      Height:        Intake/Output Summary (Last 24 hours) at 03/02/2020 1221 Last data filed at 03/02/2020 1014 Gross per 24 hour  Intake 340 ml  Output 10 ml  Net 330 ml   Filed Weights   02/29/20 1955 03/01/20 0009  Weight: 86.2 kg 78.5 kg   Physical exam Elderly male not in distress HEENT: Moist mucosa, supple neck Chest: Clear CVs: Normal S1-S2 GI: Soft, nondistended, nontender Musculoskeletal: Warm, clean dressing with Ace wrap over the right foot post amputation of the third toe    Data Reviewed: I have personally reviewed following labs and imaging studies  CBC: Recent Labs  Lab 02/29/20 2003 03/01/20 0110 03/02/20 0344  WBC 7.7 7.2 5.5  NEUTROABS 6.3  --   --  HGB 11.8* 10.4* 10.4*  HCT 35.3* 31.7* 30.9*  MCV 90.7 90.6 90.4  PLT 291 256 255    Basic Metabolic Panel: Recent Labs  Lab 02/29/20 2003 03/01/20 0110 03/02/20 0344  NA 137 138 135  K 3.7 3.9 4.2  CL 102 104 103  CO2 22 28 25   GLUCOSE 167* 173* 140*  BUN 24* 25* 17  CREATININE 1.06 0.87 0.84  CALCIUM 9.3 8.9 8.7*    GFR: Estimated Creatinine Clearance: 87 mL/min (by C-G formula based on SCr of 0.84 mg/dL).  Liver Function Tests: Recent Labs  Lab 02/29/20 2003  AST 18  ALT 16  ALKPHOS 108  BILITOT 1.2  PROT 7.8  ALBUMIN 4.0    CBG: Recent Labs  Lab 03/01/20 0059 03/01/20 1826  GLUCAP 157* 114*     Recent Results (from the past 240 hour(s))  Blood culture (routine x 2)     Status: None (Preliminary result)    Collection Time: 02/29/20  8:03 PM   Specimen: BLOOD  Result Value Ref Range Status   Specimen Description BLOOD LEFT ARM  Final   Special Requests   Final    BOTTLES DRAWN AEROBIC AND ANAEROBIC Blood Culture results may not be optimal due to an excessive volume of blood received in culture bottles   Culture   Final    NO GROWTH 2 DAYS Performed at [facility], [address] [place], [place]    Report Status PENDING  Incomplete  Blood culture (routine x 2)     Status: None (Preliminary result)   Collection Time: 02/29/20 10:40 PM   Specimen: BLOOD  Result Value Ref Range Status   Specimen Description BLOOD BLOOD LEFT FOREARM  Final   Special Requests   Final    BOTTLES DRAWN AEROBIC AND ANAEROBIC Blood Culture results may not be optimal due to an excessive volume of blood received in culture bottles   Culture   Final    NO GROWTH 2 DAYS Performed at [facility], [address], [place], [place]    Report Status PENDING  Incomplete  Respiratory Panel by RT PCR (Flu A&B, Covid) - Nasopharyngeal Swab     Status: None   Collection Time: 02/29/20 10:42 PM   Specimen: Nasopharyngeal Swab  Result Value Ref Range Status   SARS Coronavirus 2 by RT PCR NEGATIVE NEGATIVE Final    Comment: (NOTE) SARS-CoV-2 target nucleic acids are NOT DETECTED. The SARS-CoV-2 RNA is generally detectable in upper respiratoy specimens during the acute phase of infection. The lowest concentration of SARS-CoV-2 viral copies this assay can detect is 131 copies/mL. A negative result does not preclude SARS-Cov-2 infection and should not be used as the sole basis for treatment or other patient management decisions. A negative result may occur with  improper specimen collection/handling, submission of specimen other than nasopharyngeal swab, presence of viral mutation(s) within the areas targeted by this assay, and inadequate number of viral copies (<131 copies/mL). A  negative result must be combined with clinical observations, patient history, and epidemiological information. The expected result is Negative. Fact Sheet for Patients:  04/30/20 Fact Sheet for Healthcare Providers:  https://www.moore.com/ This test is not yet ap proved or cleared by the https://www.young.biz/ FDA and  has been authorized for detection and/or diagnosis of SARS-CoV-2 by FDA under an Emergency Use Authorization (EUA). This EUA will remain  in effect (meaning this test can be used) for the duration of the COVID-19 declaration under Section  564(b)(1) of the Act, 21 U.S.C. section 360bbb-3(b)(1), unless the authorization is terminated or revoked sooner.    Influenza A by PCR NEGATIVE NEGATIVE Final   Influenza B by PCR NEGATIVE NEGATIVE Final    Comment: (NOTE) The Xpert Xpress SARS-CoV-2/FLU/RSV assay is intended as an aid in  the diagnosis of influenza from Nasopharyngeal swab specimens and  should not be used as a sole basis for treatment. Nasal washings and  aspirates are unacceptable for Xpert Xpress SARS-CoV-2/FLU/RSV  testing. Fact Sheet for Patients: PinkCheek.be Fact Sheet for Healthcare Providers: GravelBags.it This test is not yet approved or cleared by the [place] FDA and  has been authorized for detection and/or diagnosis of SARS-CoV-2 by  FDA under an Emergency Use Authorization (EUA). This EUA will remain  in effect (meaning this test can be used) for the duration of the  Covid-19 declaration under Section 564(b)(1) of the Act, 21  U.S.C. section 360bbb-3(b)(1), unless the authorization is  terminated or revoked. Performed at [facility], [address] [place], [place] [address]   Aerobic/Anaerobic Culture (surgical/deep wound)     Status: None (Preliminary result)   Collection Time: 03/01/20  5:50 PM   Specimen: PATH Other; Tissue    Result Value Ref Range Status   Specimen Description TISSUE  Final   Special Requests RIGHT THIRD TOE  Final   Gram Stain   Final    RARE WBC PRESENT, PREDOMINANTLY PMN RARE GRAM POSITIVE COCCI IN PAIRS    Culture   Final    CULTURE REINCUBATED FOR BETTER GROWTH Performed at [facility], [place] [address] [place], [place] [address]    Report Status PENDING  Incomplete         Radiology Studies: DG Foot Complete Right  Result Date: 02/29/2020 CLINICAL DATA:  Infection. Right foot pain and swelling. Wound on the third toe. EXAM: RIGHT FOOT COMPLETE - 3+ VIEW COMPARISON:  February 05, 2005 FINDINGS: There is soft tissue swelling about the third digit. There are erosions involving the distal phalanx of the third digit. There are degenerative changes of the interphalangeal joints, greatest within the fourth and fifth digits. There are degenerative changes of the midfoot. There is a small plantar calcaneal spur. Vascular calcifications are noted. IMPRESSION: 1. Erosions involving the distal phalanx of the third digit concerning for osteomyelitis. 2. Soft tissue swelling about the third digit. 3. Degenerative changes of the interphalangeal joints and midfoot. Electronically Signed   By: Constance Holster M.D.   On: 02/29/2020 21:08        Scheduled Meds: . aspirin  81 mg Oral QHS  . heparin  5,000 Units Subcutaneous Q8H  . pantoprazole  40 mg Oral Daily  . pravastatin  80 mg Oral QPM  . sodium chloride flush  3 mL Intravenous Q12H   Continuous Infusions: .  ceFAZolin (ANCEF) IV 2 g (03/02/20 0622)  . vancomycin       LOS: 2 days    Time spent: 25 minutes    Oluchi Pucci, MD [facility]   To contact the attending provider between 7A-7P or the covering provider during after hours 7P-7A, please log into the web site [email or web address] and access using universal Bonnie password for that web site. If you do not have the password, please call the hospital  operator.  03/02/2020, 12:21 PM

## 2020-03-02 NOTE — Plan of Care (Signed)
Patient discharged home per MD order. Prescription given to patient. All discharge instructions given and all questions answered. 

## 2020-03-02 NOTE — Evaluation (Signed)
Physical Therapy Evaluation Patient Details Name: Alex LOTHROP Sr. MRN: [id number] DOB: [birth year] Today's Date: 03/02/2020   History of Present Illness  Per MD notes: Pt is a 74 y.o. male with a pertinent history of GERD, hypertension, and diabetes who presents to the [facility] ED with a toe infection.  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    Clinical Impression  Pt pleasant and motivated to participate during the session.  Pt was Ind with bed mobility and transfers with good control and stability.  During amb and dynamic gait training pt ambulated with a mildly wide BOS and with minimal drifting left/right that the pt attributed to walking with the post op shoe donned.  Pt education provided on proper sequencing ascending and descending stairs to protect the R foot with pt able to demonstrate good carryover of proper sequencing.  Pt declined HHPT services and was educated to let PCP know if that changes in the future. Will keep pt on PT caseload while in acute care to continue to address deficits listed in PT problem list.     Follow Up Recommendations No PT follow up;Other (comment)(Pt declined HHPT services)    Equipment Recommendations  None recommended by PT    Recommendations for Other Services       Precautions / Restrictions Precautions Precautions: None Required Braces or Orthoses: Other Brace Other Brace: Post op shoe to the R foot with weight bearing Restrictions Weight Bearing Restrictions: Yes RLE Weight Bearing: Weight bearing as tolerated Other Position/Activity Restrictions: WBAT with post op shoe donned      Mobility  Bed Mobility Overal bed mobility: Independent                Transfers Overall transfer level: Independent Equipment used: None                Ambulation/Gait Ambulation/Gait assistance: Supervision Gait Distance (Feet): 200 Feet Assistive device: None Gait  Pattern/deviations: Drifts right/left;Wide base of support Gait velocity: decreased   General Gait Details: Post op shoe donned to R foot; min increased BOS with amb with mild drifting left/right but no LOB  Stairs Stairs: Yes Stairs assistance: Supervision Stair Management: Forwards;Step to pattern;One rail Left Number of Stairs: 4 General stair comments: Min verbal cues for sequencing for ascending with LLE and descending with RLE; pt steady with good eccentric and concentric control  Wheelchair Mobility    Modified Rankin (Stroke Patients Only)       Balance Overall balance assessment: Mild deficits observed, not formally tested                                           Pertinent Vitals/Pain Pain Assessment: No/denies pain    Home Living Family/patient expects to be discharged to:: Private residence Living Arrangements: Spouse/significant other;Children Available Help at Discharge: Family;Available 24 hours/day Type of Home: House Home Access: Stairs to enter Entrance Stairs-Rails: Left Entrance Stairs-Number of Steps: 4 Home Layout: One level Home Equipment: Walker - 2 wheels;Wheelchair - manual      Prior Function Level of Independence: Independent         Comments: Ind amb without an AD community distances, no fall history, Ind with ADLs, mows lawns with a push mower, highly active     Hand Dominance  Extremity/Trunk Assessment   Upper Extremity Assessment Upper Extremity Assessment: Overall WFL for tasks assessed    Lower Extremity Assessment Lower Extremity Assessment: Overall WFL for tasks assessed       Communication   Communication: No difficulties  Cognition Arousal/Alertness: Awake/alert Behavior During Therapy: WFL for tasks assessed/performed Overall Cognitive Status: Within Functional Limits for tasks assessed                                        General Comments      Exercises Other  Exercises Other Exercises: Dynamic gait training with start/stops and 180 deg turns   Assessment/Plan    PT Assessment Patient needs continued PT services  PT Problem List Decreased balance;Decreased activity tolerance       PT Treatment Interventions DME instruction;Gait training;Stair training;Functional mobility training;Therapeutic activities;Therapeutic exercise;Balance training;Patient/family education    PT Goals (Current goals can be found in the Care Plan section)  Acute Rehab PT Goals Patient 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 PT Goal Formulation: With patient Time For Goal Achievement: 03/15/20 Potential to Achieve Goals: Good    Frequency 7X/week   Barriers to discharge        Co-evaluation               AM-PAC PT "6 Clicks" Mobility  Outcome Measure Help needed turning from your back to your side while in a flat bed without using bedrails?: None Help needed moving from lying on your back to sitting on the side of a flat bed without using bedrails?: None Help needed moving to and from a bed to a chair (including a wheelchair)?: None Help needed standing up from a chair using your arms (e.g., wheelchair or bedside chair)?: None Help needed to walk in hospital room?: A Little Help needed climbing 3-5 steps with a railing? : A Little 6 Click Score: 22    End of Session Equipment Utilized During Treatment: Gait belt Activity Tolerance: Patient tolerated treatment well Patient left: in chair;with call bell/phone within reach;with chair alarm set Nurse Communication: Mobility status PT Visit Diagnosis: Unsteadiness on feet (R26.81);Difficulty in walking, not elsewhere classified (R26.2)    Time: 6468-0321 PT Time Calculation (min) (ACUTE ONLY): 27 min   Charges:   PT Evaluation $PT Eval Moderate Complexity: 1 Mod PT Treatments $Gait Training: 8-22 mins        D. Elly Modena PT, DPT 03/02/20, 11:36 AM

## 2020-03-02 NOTE — Discharge Instructions (Signed)
Diabetes Mellitus and Foot Care Foot care is an important part of your health, especially when you have diabetes. Diabetes may cause you to have problems because of poor blood flow (circulation) to your feet and legs, which can cause your skin to:  Become thinner and drier.  Break more easily.  Heal more slowly.  Peel and crack. You may also have nerve damage (neuropathy) in your legs and feet, causing decreased feeling in them. This means that you may not notice minor injuries to your feet that could lead to more serious problems. Noticing and addressing any potential problems early is the best way to prevent future foot problems. How to care for your feet Foot hygiene  Wash your feet daily with warm water and mild soap. Do not use hot water. Then, pat your feet and the areas between your toes until they are completely dry. Do not soak your feet as this can dry your skin.  Trim your toenails straight across. Do not dig under them or around the cuticle. File the edges of your nails with an emery board or nail file.  Apply a moisturizing lotion or petroleum jelly to the skin on your feet and to dry, brittle toenails. Use lotion that does not contain alcohol and is unscented. Do not apply lotion between your toes. Shoes and socks  Wear clean socks or stockings every day. Make sure they are not too tight. Do not wear knee-high stockings since they may decrease blood flow to your legs.  Wear shoes that fit properly and have enough cushioning. Always look in your shoes before you put them on to be sure there are no objects inside.  To break in new shoes, wear them for just a few hours a day. This prevents injuries on your feet. Wounds, scrapes, corns, and calluses  Check your feet daily for blisters, cuts, bruises, sores, and redness. If you cannot see the bottom of your feet, use a mirror or ask someone for help.  Do not cut corns or calluses or try to remove them with medicine.  If you  find a minor scrape, cut, or break in the skin on your feet, keep it and the skin around it clean and dry. You may clean these areas with mild soap and water. Do not clean the area with peroxide, alcohol, or iodine.  If you have a wound, scrape, corn, or callus on your foot, look at it several times a day to make sure it is healing and not infected. Check for: ? Redness, swelling, or pain. ? Fluid or blood. ? Warmth. ? Pus or a bad smell. General instructions  Do not cross your legs. This may decrease blood flow to your feet.  Do not use heating pads or hot water bottles on your feet. They may burn your skin. If you have lost feeling in your feet or legs, you may not know this is happening until it is too late.  Protect your feet from hot and cold by wearing shoes, such as at the beach or on hot pavement.  Schedule a complete foot exam at least once a year (annually) or more often if you have foot problems. If you have foot problems, report any cuts, sores, or bruises to your health care provider immediately. Contact a health care provider if:  You have a medical condition that increases your risk of infection and you have any cuts, sores, or bruises on your feet.  You have an injury that is not   healing.  You have redness on your legs or feet.  You feel burning or tingling in your legs or feet.  You have pain or cramps in your legs and feet.  Your legs or feet are numb.  Your feet always feel cold.  You have pain around a toenail. Get help right away if:  You have a wound, scrape, corn, or callus on your foot and: ? You have pain, swelling, or redness that gets worse. ? You have fluid or blood coming from the wound, scrape, corn, or callus. ? Your wound, scrape, corn, or callus feels warm to the touch. ? You have pus or a bad smell coming from the wound, scrape, corn, or callus. ? You have a fever. ? You have a red line going up your leg. Summary  Check your feet every day  for cuts, sores, red spots, swelling, and blisters.  Moisturize feet and legs daily.  Wear shoes that fit properly and have enough cushioning.  If you have foot problems, report any cuts, sores, or bruises to your health care provider immediately.  Schedule a complete foot exam at least once a year (annually) or more often if you have foot problems. This information is not intended to replace advice given to you by your health care provider. Make sure you discuss any questions you have with your health care provider. Document Revised: 07/10/2019 Document Reviewed: 11/18/2016 [employer] Patient Education  2020 [employer]  

## 2020-03-02 NOTE — Discharge Summary (Signed)
Physician Discharge Summary  Alex Soto Sr. MEB:[id number] DOB: 1946-10-13 DOA: 02/29/2020  PCP: Alex Regulus, MD  Admit date: 02/29/2020 Discharge date: 03/02/2020  Admitted From: Home Disposition: Home  Recommendations for Outpatient Follow-up:  1. Follow-up with podiatry Dr. Ether Griffins in 1 week. 2. Patient being discharged on 10-day course of oral Augmentin.  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  Reportedly started after ripping out hangnail of which took a flap of the skin with it.  Was noted to have an ulceration on the bottom of the toe.  X-ray done in the ED was concerning for osteomyelitis. Admitted for further management and podiatry consulted.   Hospital course  Principal Problem:   Osteomyelitis of right 3rd toe (HCC) Associated diabetic foot ulcer with full-thickness lesion to the bone. Taken to the OR on 5/2 and underwent amputation of the right third toe PIP joint.  Cultures sent. Started on empiric IV vancomycin and Ancef postop. Pain control with as needed oxycodone.  Apply postop shoe.  Seen by PT but declined home health. Seen by podiatry this morning and recommended he was stable to be discharged home on oral  antibiotic.  We will discharge him on oral Augmentin for 10 days and have him follow-up with Dr. Ether Griffins in 1 week.   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    CBG stable.  Resume home oral meds.  Counseled on tight blood glucose as outpatient.   Essential hypertension Stable on home meds.  Hyperlipidemia Continue statin.     Anemia, unspecified Ordered iron and B12 which can be followed as outpatient.   Procedure: Amputation of the distal PIP joint of the right third to Consult: Podiatry Disposition: Home  Family communication: Wife updated on the phone  Discharge Instructions   Allergies as of 03/02/2020   No Known Allergies     Medication List    TAKE these medications   acetaminophen 500 MG tablet Commonly known as: TYLENOL Take 1,000 mg by mouth every 6 (six) hours as needed.   amLODipine 10 MG tablet Commonly known as: NORVASC Take 10 mg by mouth at bedtime.   amoxicillin-clavulanate 875-125 MG tablet Commonly known as: Augmentin Take 1 tablet by mouth 2 (two) times daily for 10 days.   aspirin 81 MG tablet Take 81 mg by mouth at bedtime.   cyclobenzaprine 10 MG tablet Commonly known as: FLEXERIL TAKE 1/2 TO 1 TABLET BY MOUTH THREE TIMES A DAY AS NEEDED FOR MUSCLE SPASMS   fluticasone 50 MCG/ACT nasal spray Commonly known as: FLONASE Place 1 spray into both nostrils daily as needed for allergies or rhinitis.   glyBURIDE-metformin 2.5-500 MG tablet Commonly known as: GLUCOVANCE Take 1 tablet by mouth at bedtime.  lactulose 10 GM/15ML solution Commonly known as: CHRONULAC take 30 milliliters by mouth three times a day for 10 days   lisinopril-hydrochlorothiazide 20-12.5 MG tablet Commonly known as: ZESTORETIC Take 1 tablet by mouth at bedtime.   omeprazole 20 MG capsule Commonly known as: PRILOSEC Take 20 mg by mouth as needed.   oxyCODONE 5 MG immediate  release tablet Commonly known as: Roxicodone Take 1 tablet (5 mg total) by mouth every 6 (six) hours as needed for severe pain. What changed:   how much to take  when to take this   pioglitazone 15 MG tablet Commonly known as: ACTOS Take 15 mg by mouth at bedtime.   pravastatin 80 MG tablet Commonly known as: PRAVACHOL Take 80 mg by mouth every evening.   promethazine 25 MG suppository Commonly known as: PHENERGAN Place rectally.   ranitidine 150 MG tablet Commonly known as: ZANTAC Take 1 tablet (150 mg total) by mouth 2 (two) times daily.   sucralfate 1 GM/10ML suspension Commonly known as: Carafate Take 10 mLs (1 g total) by mouth 4 (four) times daily -  with meals and at bedtime.      Follow-up Information    Alex Regulus, MD. Schedule an appointment as soon as possible for a visit in 1 week(s).   Specialty: Internal Medicine Contact information: [address] [facility] [place] [address] [phone]        Alex Soto, DPM. Schedule an appointment as soon as possible for a visit in 1 week(s).   Specialty: Podiatry Contact information: [address] [place] [address] [phone]          No Known Allergies      Procedures/Studies: DG Foot Complete Right  Result Date: 02/29/2020 CLINICAL DATA:  Infection. Right foot pain and swelling. Wound on the third toe. EXAM: RIGHT FOOT COMPLETE - 3+ VIEW COMPARISON:  February 05, 2005 FINDINGS: There is soft tissue swelling about the third digit. There are erosions involving the distal phalanx of the third digit. There are degenerative changes of the interphalangeal joints, greatest within the fourth and fifth digits. There are degenerative changes of the midfoot. There is a small plantar calcaneal spur. Vascular calcifications are noted. IMPRESSION: 1. Erosions involving the distal phalanx of the third digit concerning for osteomyelitis. 2. Soft tissue swelling about  the third digit. 3. Degenerative changes of the interphalangeal joints and midfoot. Electronically Signed   By: Katherine Mantle M.D.   On: 02/29/2020 21:08       Subjective: Seen and examined.  Denies any pain.  Discharge Exam: Vitals:   03/02/20 0728 03/02/20 1128  BP: 138/63 136/67  Pulse: (!) 51 (!) 54  Resp: 16   Temp: 98.5 F (36.9 C) 98.7 F (37.1 C)  SpO2: 98% 99%   Vitals:   03/01/20 2251 03/02/20 0418 03/02/20 0728 03/02/20 1128  BP: (!) 144/70 (!) 144/67 138/63 136/67  Pulse: (!) 52 (!) 55 (!) 51 (!) 54  Resp: 16 16 16    Temp: 98.7 F (37.1 C) 98.7 F (37.1 C) 98.5 F (36.9 C) 98.7 F (37.1 C)  TempSrc: Oral Oral Oral Oral  SpO2: 98% 99% 98% 99%  Weight:      Height:        General: Elderly male in no acute distress HEENT: Moist mucosa, supple neck Chest: Clear CVs: Normal S1-S2 GI: Soft, nondistended nontender Musculoskeletal: Clean dressing with Ace wrap over the right foot covering the toe  The results of significant diagnostics from this hospitalization (including imaging, microbiology, ancillary and laboratory) are listed below for reference.     Microbiology: Recent Results (from the past 240 hour(s))  Blood culture (routine x 2)     Status: None (Preliminary result)   Collection Time: 02/29/20  8:03 PM   Specimen: BLOOD  Result Value Ref Range Status   Specimen Description BLOOD LEFT ARM  Final   Special Requests   Final    BOTTLES DRAWN AEROBIC AND ANAEROBIC Blood Culture results may not be optimal due to an excessive volume of blood received in culture bottles   Culture   Final    NO GROWTH 2 DAYS Performed at [facility], [address] [place], [place] [address]    Report Status PENDING  Incomplete  Blood culture (routine x 2)     Status: None (Preliminary result)   Collection Time: 02/29/20 10:40 PM   Specimen: BLOOD  Result Value Ref Range Status   Specimen Description BLOOD BLOOD LEFT FOREARM  Final    Special Requests   Final    BOTTLES DRAWN AEROBIC AND ANAEROBIC Blood Culture results may not be optimal due to an excessive volume of blood received in culture bottles   Culture   Final    NO GROWTH 2 DAYS Performed at [facility], [address], [place], [place] [address]    Report Status PENDING  Incomplete  Respiratory Panel by RT PCR (Flu A&B, Covid) - Nasopharyngeal Swab     Status: None   Collection Time: 02/29/20 10:42 PM   Specimen: Nasopharyngeal Swab  Result Value Ref Range Status   SARS Coronavirus 2 by RT PCR NEGATIVE NEGATIVE Final    Comment: (NOTE) SARS-CoV-2 target nucleic acids are NOT DETECTED. The SARS-CoV-2 RNA is generally detectable in upper respiratoy specimens during the acute phase of infection. The lowest concentration of SARS-CoV-2 viral copies this assay can detect is 131 copies/mL. A negative result does not preclude SARS-Cov-2 infection and should not be used as the sole basis for treatment or other patient management decisions. A negative result may occur with  improper specimen collection/handling, submission of specimen other than nasopharyngeal swab, presence of viral mutation(s) within the areas targeted by this assay, and inadequate number of viral copies (<131 copies/mL). A negative result must be combined with clinical observations, patient history, and epidemiological information. The expected result is Negative. Fact Sheet for Patients:  PinkCheek.be Fact Sheet for Healthcare Providers:  GravelBags.it This test is not yet ap proved or cleared by the [place] FDA and  has been authorized for detection and/or diagnosis of SARS-CoV-2 by FDA under an Emergency Use Authorization (EUA). This EUA will remain  in effect (meaning this test can be used) for the duration of the COVID-19 declaration under Section 564(b)(1) of the Act, 21 U.S.C. section 360bbb-3(b)(1), unless the  authorization is terminated or revoked sooner.    Influenza A by PCR NEGATIVE NEGATIVE Final   Influenza B by PCR NEGATIVE NEGATIVE Final    Comment: (NOTE) The Xpert Xpress SARS-CoV-2/FLU/RSV assay is intended as an aid in  the diagnosis of influenza from Nasopharyngeal swab specimens and  should not be used as a sole basis for treatment. Nasal washings and  aspirates are unacceptable for Xpert Xpress SARS-CoV-2/FLU/RSV  testing. Fact Sheet for Patients: PinkCheek.be Fact Sheet for Healthcare Providers: GravelBags.it This test is not yet approved or cleared by the [place] FDA and  has been authorized for detection and/or  diagnosis of SARS-CoV-2 by  FDA under an Emergency Use Authorization (EUA). This EUA will remain  in effect (meaning this test can be used) for the duration of the  Covid-19 declaration under Section 564(b)(1) of the Act, 21  U.S.C. section 360bbb-3(b)(1), unless the authorization is  terminated or revoked. Performed at [facility], [address] [place], [place] [address]   Aerobic/Anaerobic Culture (surgical/deep wound)     Status: None (Preliminary result)   Collection Time: 03/01/20  5:50 PM   Specimen: PATH Other; Tissue  Result Value Ref Range Status   Specimen Description TISSUE  Final   Special Requests RIGHT THIRD TOE  Final   Gram Stain   Final    RARE WBC PRESENT, PREDOMINANTLY PMN RARE GRAM POSITIVE COCCI IN PAIRS    Culture   Final    CULTURE REINCUBATED FOR BETTER GROWTH Performed at [facility], [address] [place], [place] [address]    Report Status PENDING  Incomplete     Labs: BNP (last 3 results) No results for input(s): BNP in the last 8760 hours. Basic Metabolic Panel: Recent Labs  Lab 02/29/20 2003 03/01/20 0110 03/02/20 0344  NA 137 138 135  K 3.7 3.9 4.2  CL 102 104 103  CO2 22 28 25   GLUCOSE 167* 173* 140*  BUN 24* 25* 17   CREATININE 1.06 0.87 0.84  CALCIUM 9.3 8.9 8.7*   Liver Function Tests: Recent Labs  Lab 02/29/20 2003  AST 18  ALT 16  ALKPHOS 108  BILITOT 1.2  PROT 7.8  ALBUMIN 4.0   No results for input(s): LIPASE, AMYLASE in the last 168 hours. No results for input(s): AMMONIA in the last 168 hours. CBC: Recent Labs  Lab 02/29/20 2003 03/01/20 0110 03/02/20 0344  WBC 7.7 7.2 5.5  NEUTROABS 6.3  --   --   HGB 11.8* 10.4* 10.4*  HCT 35.3* 31.7* 30.9*  MCV 90.7 90.6 90.4  PLT 291 256 255   Cardiac Enzymes: No results for input(s): CKTOTAL, CKMB, CKMBINDEX, TROPONINI in the last 168 hours. BNP: Invalid input(s): POCBNP CBG: Recent Labs  Lab 03/01/20 0059 03/01/20 1826  GLUCAP 157* 114*   D-Dimer No results for input(s): DDIMER in the last 72 hours. Hgb A1c Recent Labs    03/01/20 0110  HGBA1C 7.6*   Lipid Profile No results for input(s): CHOL, HDL, LDLCALC, TRIG, CHOLHDL, LDLDIRECT in the last 72 hours. Thyroid function studies No results for input(s): TSH, T4TOTAL, T3FREE, THYROIDAB in the last 72 hours.  Invalid input(s): FREET3 Anemia work up No results for input(s): VITAMINB12, FOLATE, FERRITIN, TIBC, IRON, RETICCTPCT in the last 72 hours. Urinalysis    Component Value Date/Time   COLORURINE YELLOW (A) 07/23/2017 1249   APPEARANCEUR HAZY (A) 07/23/2017 1249   APPEARANCEUR Cloudy 04/08/2014 1251   LABSPEC 1.023 07/23/2017 1249   LABSPEC 1.029 04/08/2014 1251   PHURINE 5.0 07/23/2017 1249   GLUCOSEU NEGATIVE 07/23/2017 1249   GLUCOSEU 150 mg/dL 13/24/401006/07/2014 [id number]   HGBUR NEGATIVE 07/23/2017 1249   BILIRUBINUR NEGATIVE 07/23/2017 1249   BILIRUBINUR Negative 04/08/2014 1251   KETONESUR NEGATIVE 07/23/2017 1249   PROTEINUR 100 (A) 07/23/2017 1249   NITRITE NEGATIVE 07/23/2017 1249   LEUKOCYTESUR NEGATIVE 07/23/2017 1249   LEUKOCYTESUR Negative 04/08/2014 1251   Sepsis Labs Invalid input(s): PROCALCITONIN,  WBC,  LACTICIDVEN Microbiology Recent Results  (from the past 240 hour(s))  Blood culture (routine x 2)     Status: None (Preliminary result)  Collection Time: 02/29/20  8:03 PM   Specimen: BLOOD  Result Value Ref Range Status   Specimen Description BLOOD LEFT ARM  Final   Special Requests   Final    BOTTLES DRAWN AEROBIC AND ANAEROBIC Blood Culture results may not be optimal due to an excessive volume of blood received in culture bottles   Culture   Final    NO GROWTH 2 DAYS Performed at [facility], [address], [place], [place] [address]    Report Status PENDING  Incomplete  Blood culture (routine x 2)     Status: None (Preliminary result)   Collection Time: 02/29/20 10:40 PM   Specimen: BLOOD  Result Value Ref Range Status   Specimen Description BLOOD BLOOD LEFT FOREARM  Final   Special Requests   Final    BOTTLES DRAWN AEROBIC AND ANAEROBIC Blood Culture results may not be optimal due to an excessive volume of blood received in culture bottles   Culture   Final    NO GROWTH 2 DAYS Performed at [facility], [address], [place], [place] [address]    Report Status PENDING  Incomplete  Respiratory Panel by RT PCR (Flu A&B, Covid) - Nasopharyngeal Swab     Status: None   Collection Time: 02/29/20 10:42 PM   Specimen: Nasopharyngeal Swab  Result Value Ref Range Status   SARS Coronavirus 2 by RT PCR NEGATIVE NEGATIVE Final    Comment: (NOTE) SARS-CoV-2 target nucleic acids are NOT DETECTED. The SARS-CoV-2 RNA is generally detectable in upper respiratoy specimens during the acute phase of infection. The lowest concentration of SARS-CoV-2 viral copies this assay can detect is 131 copies/mL. A negative result does not preclude SARS-Cov-2 infection and should not be used as the sole basis for treatment or other patient management decisions. A negative result may occur with  improper specimen collection/handling, submission of specimen other than nasopharyngeal swab, presence of viral mutation(s)  within the areas targeted by this assay, and inadequate number of viral copies (<131 copies/mL). A negative result must be combined with clinical observations, patient history, and epidemiological information. The expected result is Negative. Fact Sheet for Patients:  https://www.moore.com/ Fact Sheet for Healthcare Providers:  https://www.young.biz/ This test is not yet ap proved or cleared by the [place] FDA and  has been authorized for detection and/or diagnosis of SARS-CoV-2 by FDA under an Emergency Use Authorization (EUA). This EUA will remain  in effect (meaning this test can be used) for the duration of the COVID-19 declaration under Section 564(b)(1) of the Act, 21 U.S.C. section 360bbb-3(b)(1), unless the authorization is terminated or revoked sooner.    Influenza A by PCR NEGATIVE NEGATIVE Final   Influenza B by PCR NEGATIVE NEGATIVE Final    Comment: (NOTE) The Xpert Xpress SARS-CoV-2/FLU/RSV assay is intended as an aid in  the diagnosis of influenza from Nasopharyngeal swab specimens and  should not be used as a sole basis for treatment. Nasal washings and  aspirates are unacceptable for Xpert Xpress SARS-CoV-2/FLU/RSV  testing. Fact Sheet for Patients: https://www.moore.com/ Fact Sheet for Healthcare Providers: https://www.young.biz/ This test is not yet approved or cleared by the [place] FDA and  has been authorized for detection and/or diagnosis of SARS-CoV-2 by  FDA under an Emergency Use Authorization (EUA). This EUA will remain  in effect (meaning this test can be used) for the duration of the  Covid-19 declaration under Section 564(b)(1) of the Act, 21  U.S.C. section 360bbb-3(b)(1), unless the authorization is  terminated or revoked. Performed at [facility], [address] [place], [place] [address]   Aerobic/Anaerobic Culture (surgical/deep wound)      Status: None (Preliminary result)   Collection Time: 03/01/20  5:50 PM   Specimen: PATH Other; Tissue  Result Value Ref Range Status   Specimen Description TISSUE  Final   Special Requests RIGHT THIRD TOE  Final   Gram Stain   Final    RARE WBC PRESENT, PREDOMINANTLY PMN RARE GRAM POSITIVE COCCI IN PAIRS    Culture   Final    CULTURE REINCUBATED FOR BETTER GROWTH Performed at [facility], [address], [place], [place] [address]    Report Status PENDING  Incomplete     Time coordinating discharge: 35 minutes  SIGNED:   Eddie North, MD  [facility] 03/02/2020, 1:11 PM Pager   If 7PM-7AM, please contact night-coverage [email or web address] Password [email or web address]

## 2020-03-02 NOTE — Progress Notes (Addendum)
1 Day Post-Op   Subjective/Chief Complaint: Patient seen.  Overall doing well but still has little soreness in the third toe amputation site.   Objective: Vital signs in last 24 hours: Temp:  [97.7 F (36.5 C)-99.4 F (37.4 C)] 98.7 F (37.1 C) (05/03 1128) Pulse Rate:  [50-72] 54 (05/03 1128) Resp:  [13-18] 16 (05/03 0728) BP: (136-166)/(63-83) 136/67 (05/03 1128) SpO2:  [98 %-100 %] 99 % (05/03 1128) Last BM Date: 02/29/20  Intake/Output from previous day: 05/02 0701 - 05/03 0700 In: 460 [P.O.:360; I.V.:10; IV Piggyback:90] Out: 10 [Blood:10] Intake/Output this shift: Total I/O In: 120 [P.O.:120] Out: -   The bandage on the right foot is dry and intact.  Upon removal some mild bleeding.  No evidence of any purulence.  Incision is well coapted and skin edges appear viable.  Mild erythema at the base of the toe.    Lab Results:  Recent Labs    03/01/20 0110 03/02/20 0344  WBC 7.2 5.5  HGB 10.4* 10.4*  HCT 31.7* 30.9*  PLT 256 255   BMET Recent Labs    03/01/20 0110 03/02/20 0344  NA 138 135  K 3.9 4.2  CL 104 103  CO2 28 25  GLUCOSE 173* 140*  BUN 25* 17  CREATININE 0.87 0.84  CALCIUM 8.9 8.7*   PT/INR Recent Labs    03/01/20 0110  LABPROT 13.5  INR 1.1   ABG No results for input(s): PHART, HCO3 in the last 72 hours.  Invalid input(s): PCO2, PO2  Studies/Results: DG Foot Complete Right  Result Date: 02/29/2020 CLINICAL DATA:  Infection. Right foot pain and swelling. Wound on the third toe. EXAM: RIGHT FOOT COMPLETE - 3+ VIEW COMPARISON:  February 05, 2005 FINDINGS: There is soft tissue swelling about the third digit. There are erosions involving the distal phalanx of the third digit. There are degenerative changes of the interphalangeal joints, greatest within the fourth and fifth digits. There are degenerative changes of the midfoot. There is a small plantar calcaneal spur. Vascular calcifications are noted. IMPRESSION: 1. Erosions involving the  distal phalanx of the third digit concerning for osteomyelitis. 2. Soft tissue swelling about the third digit. 3. Degenerative changes of the interphalangeal joints and midfoot. Electronically Signed   By: Katherine Mantle M.D.   On: 02/29/2020 21:08    Anti-infectives: Anti-infectives (From admission, onward)   Start     Dose/Rate Route Frequency Ordered Stop   03/02/20 2200  vancomycin (VANCOREADY) IVPB 2000 mg/400 mL     2,000 mg 200 mL/hr over 120 Minutes Intravenous Every 24 hours 03/01/20 1920     03/01/20 2200  ceFAZolin (ANCEF) IVPB 2g/100 mL premix     2 g 200 mL/hr over 30 Minutes Intravenous Every 8 hours 03/01/20 1914     03/01/20 2100  vancomycin (VANCOREADY) IVPB 2000 mg/400 mL     2,000 mg 200 mL/hr over 120 Minutes Intravenous  Once 03/01/20 1914 03/02/20 0740   02/29/20 2345  vancomycin (VANCOCIN) IVPB 1000 mg/200 mL premix  Status:  Discontinued     1,000 mg 200 mL/hr over 60 Minutes Intravenous  Once 02/29/20 2203 02/29/20 2238   02/29/20 2200  ceFEPIme (MAXIPIME) 2 g in sodium chloride 0.9 % 100 mL IVPB  Status:  Discontinued     2 g 200 mL/hr over 30 Minutes Intravenous  Once 02/29/20 2154 02/29/20 2238   02/29/20 2200  metroNIDAZOLE (FLAGYL) IVPB 500 mg  Status:  Discontinued     500 mg 100  mL/hr over 60 Minutes Intravenous  Once 02/29/20 2154 02/29/20 2238   02/29/20 2200  vancomycin (VANCOCIN) IVPB 1000 mg/200 mL premix  Status:  Discontinued     1,000 mg 200 mL/hr over 60 Minutes Intravenous  Once 02/29/20 2154 02/29/20 2238      Assessment/Plan: s/p Procedure(s): AMPUTATION TOE (Right) Assessment: Stable status post amputation right third toe, osteomyelitis.   Plan: Betadine and a sterile dressing reapplied.  Spoke with Dr. Vickki Muff earlier today and we discussed that the patient should be stable for discharge if cleared by medicine.  He will need some oral antibiotics as well as most likely some medication for his pain.  Schedule a follow-up for Dr.  Vickki Muff in approximately 1 week.  LOS: 2 days    Durward Fortes 03/02/2020

## 2020-03-03 LAB — SURGICAL PATHOLOGY

## 2020-03-05 LAB — CULTURE, BLOOD (ROUTINE X 2)
Culture: NO GROWTH
Culture: NO GROWTH

## 2020-03-07 LAB — AEROBIC/ANAEROBIC CULTURE W GRAM STAIN (SURGICAL/DEEP WOUND)

## 2020-04-30 IMAGING — CT CT HEAD W/O CM
3 series · 15 of 45 positions shown, 18 images · non-contrast
Comparison: 07/15/2017

CLINICAL DATA: Dizziness, vertigo.

EXAM:
CT HEAD WITHOUT CONTRAST
TECHNIQUE: Contiguous axial images were obtained from the base of the skull
through the vertex without intravenous contrast.

[Series 2: head wo · axial · 0.39mm/px · z∈[+448,+563]mm · 9 of 28 slices shown, 12 images]
[im 3/28  brain]
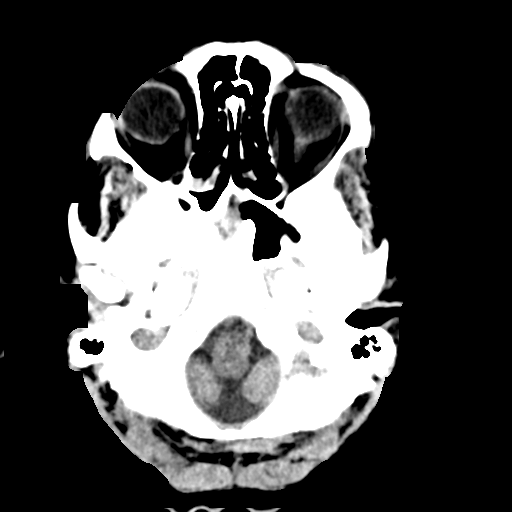
[im 3/28  bone]
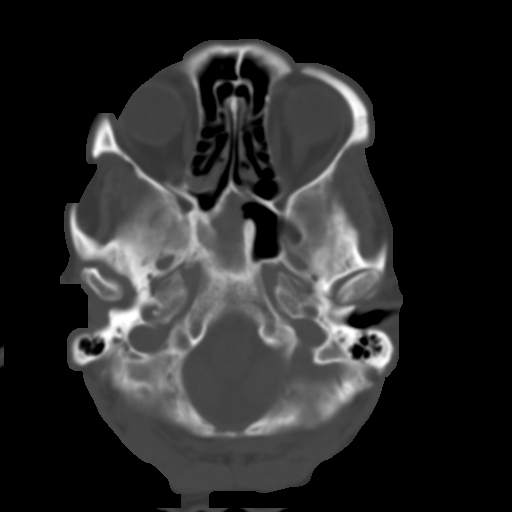
[im 6/28  brain]
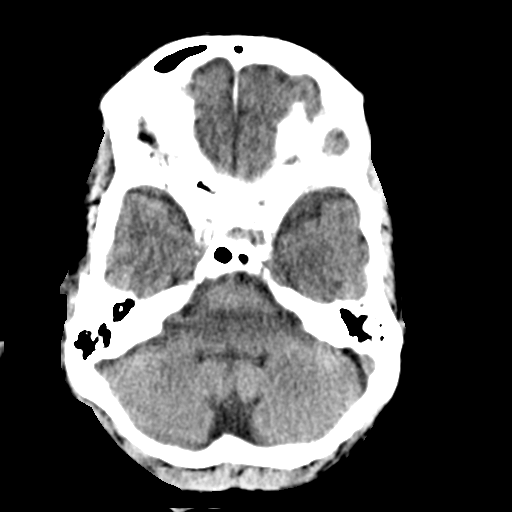
[im 9/28  brain]
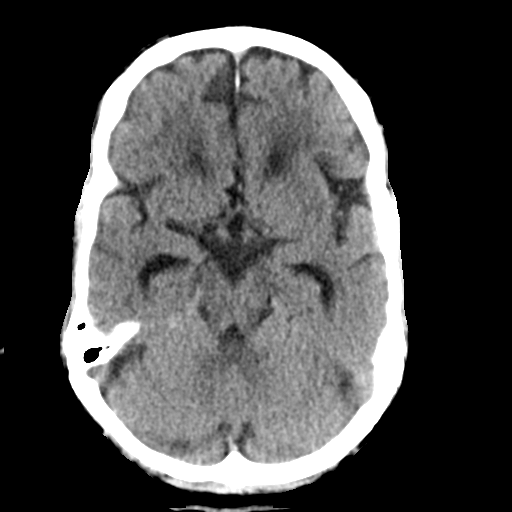
[im 12/28  brain]
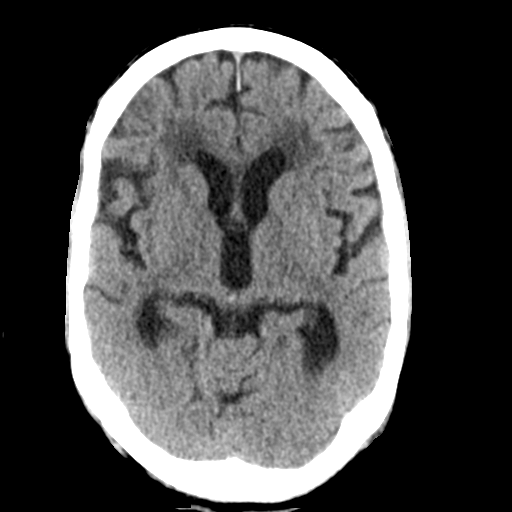
[im 15/28  brain]
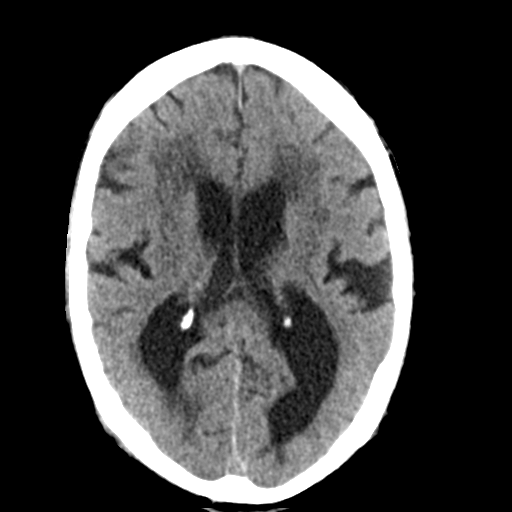
[im 15/28  bone]
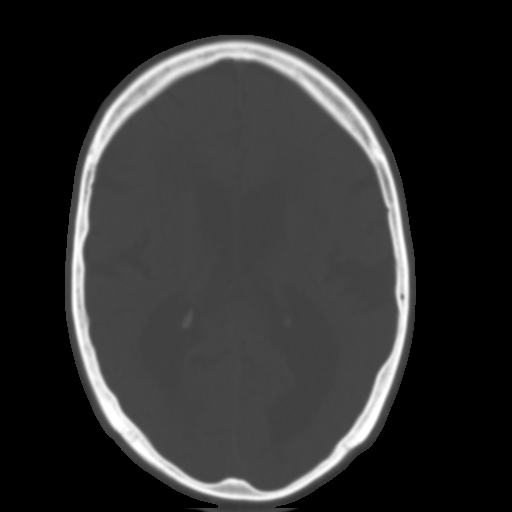
[im 17/28  brain]
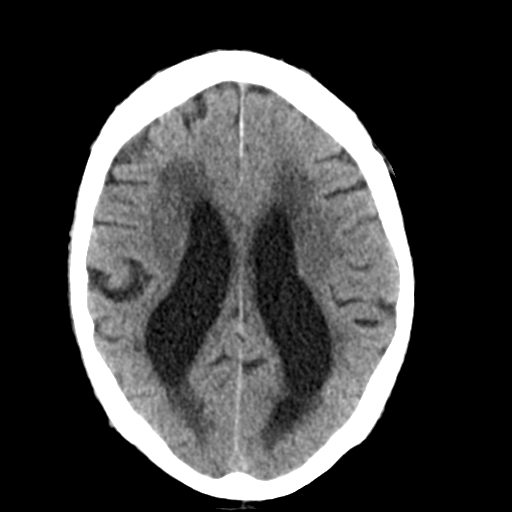
[im 20/28  brain]
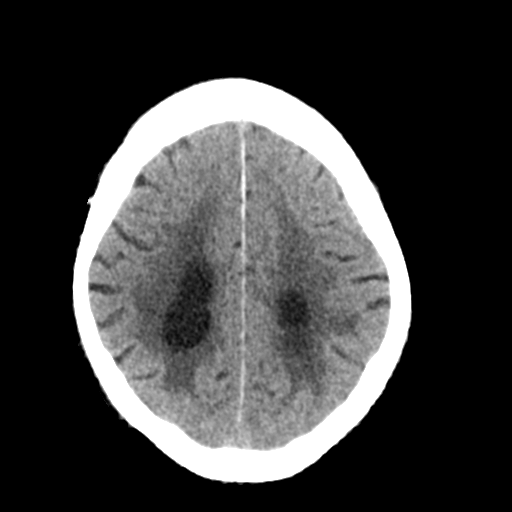
[im 23/28  brain]
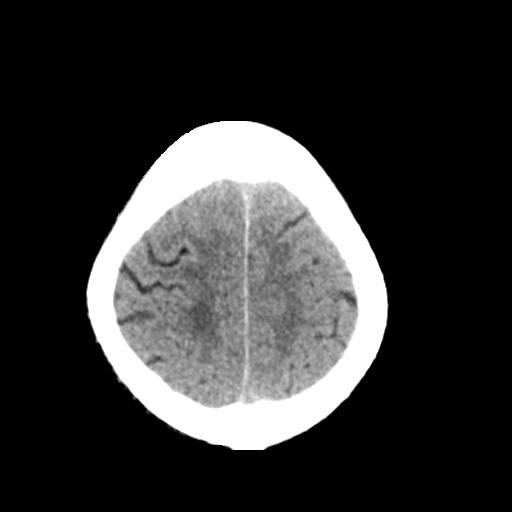
[im 26/28  brain]
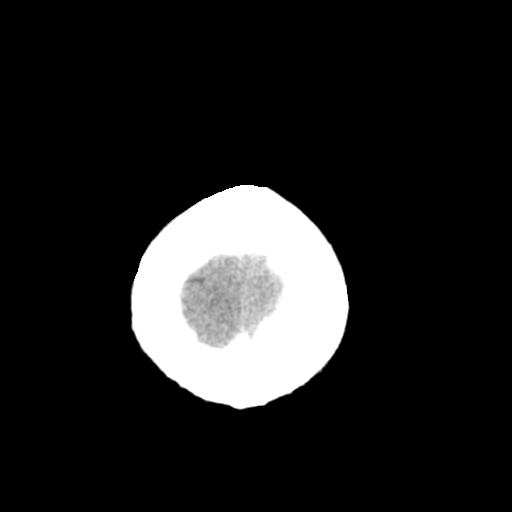
[im 26/28  bone]
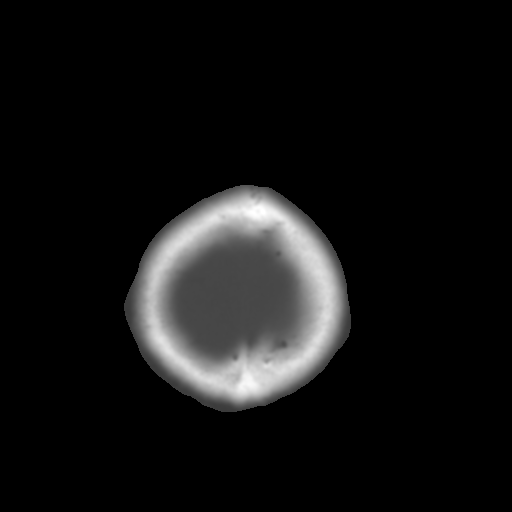

[Series 4: coronal soft tissue · coronal · 0.29mm/px · 3 of 66 slices shown]
[im 22/66  brain]
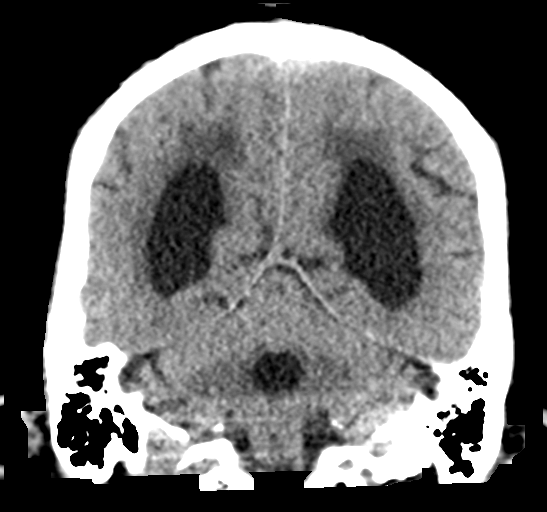
[im 29/66  brain]
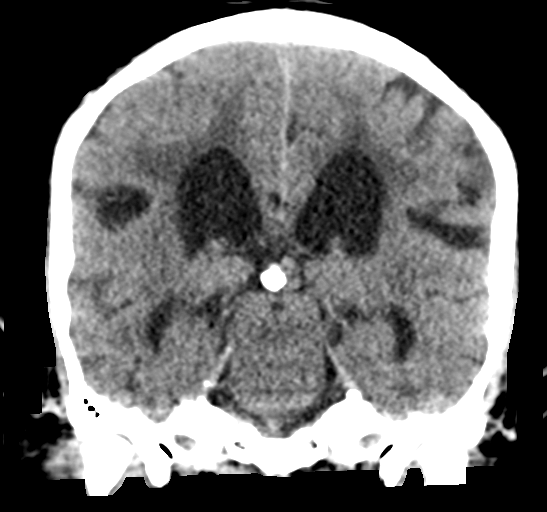
[im 37/66  brain]
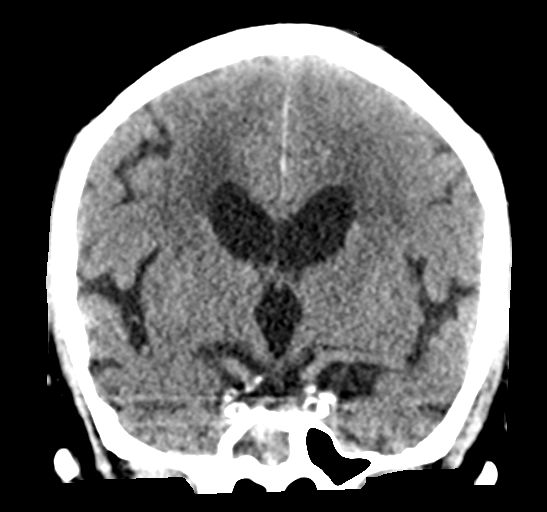

[Series 5: sagittal soft tissue · sagittal · 0.29mm/px · 3 of 56 slices shown]
[im 19/56  brain]
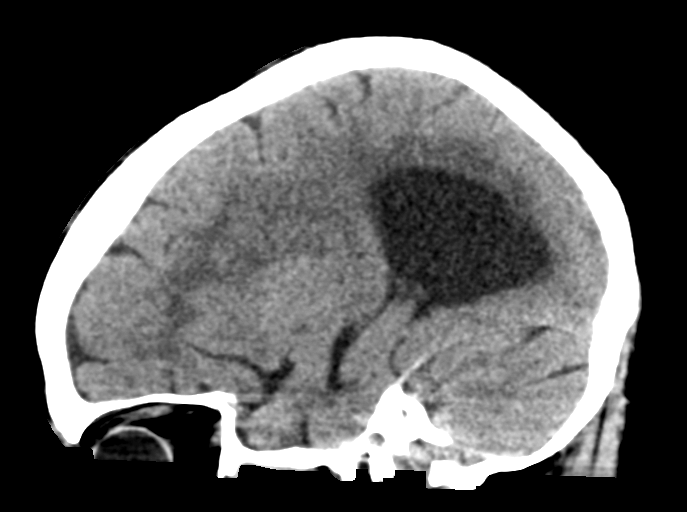
[im 28/56  brain]
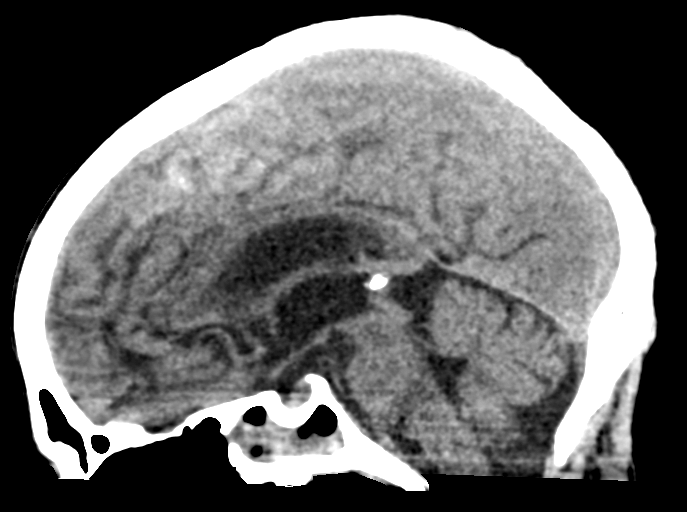
[im 37/56  brain]
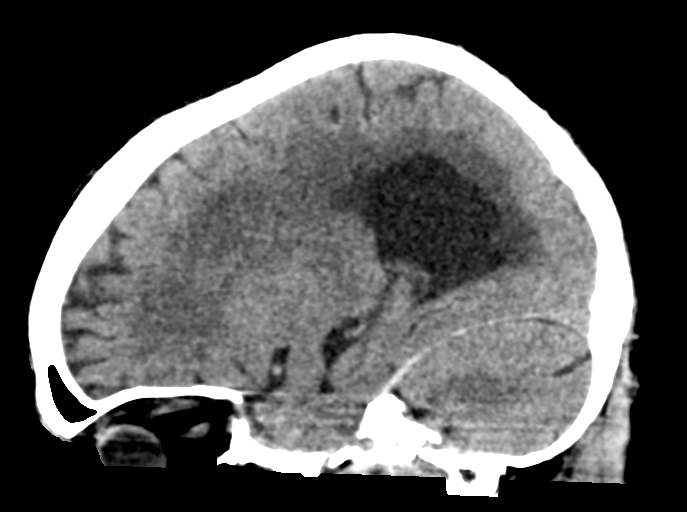

[15 of 45 positions shown; findings below may reference images not displayed]

FINDINGS: Brain: No evidence of acute infarction, hemorrhage, hydrocephalus,
extra-axial collection or mass lesion/mass effect. There is mild
diffuse low-attenuation within the subcortical and periventricular
white matter compatible with chronic microvascular disease.
Prominence of the sulci and ventricles compatible with brain
atrophy.

Vascular: No hyperdense vessel or unexpected calcification.

Skull: Normal. Negative for fracture or focal lesion.

Sinuses/Orbits: Chronic mucosal thickening and opacification of the
sphenoid sinus is again noted. Mastoid air cells are clear.

Other: None.
IMPRESSION: 1. No acute intracranial abnormalities.
2. Chronic small vessel ischemic change and brain atrophy.
3. Chronic opacification of the sphenoid sinus.

## 2020-12-23 IMAGING — CR DG FOOT COMPLETE 3+V*R*
3 series · 3 of 3 positions shown · non-contrast
Comparison: February 05, 2005

CLINICAL DATA: Infection. Right foot pain and swelling. Wound on
the third toe.

EXAM:
RIGHT FOOT COMPLETE - 3+ VIEW

[foot obl]
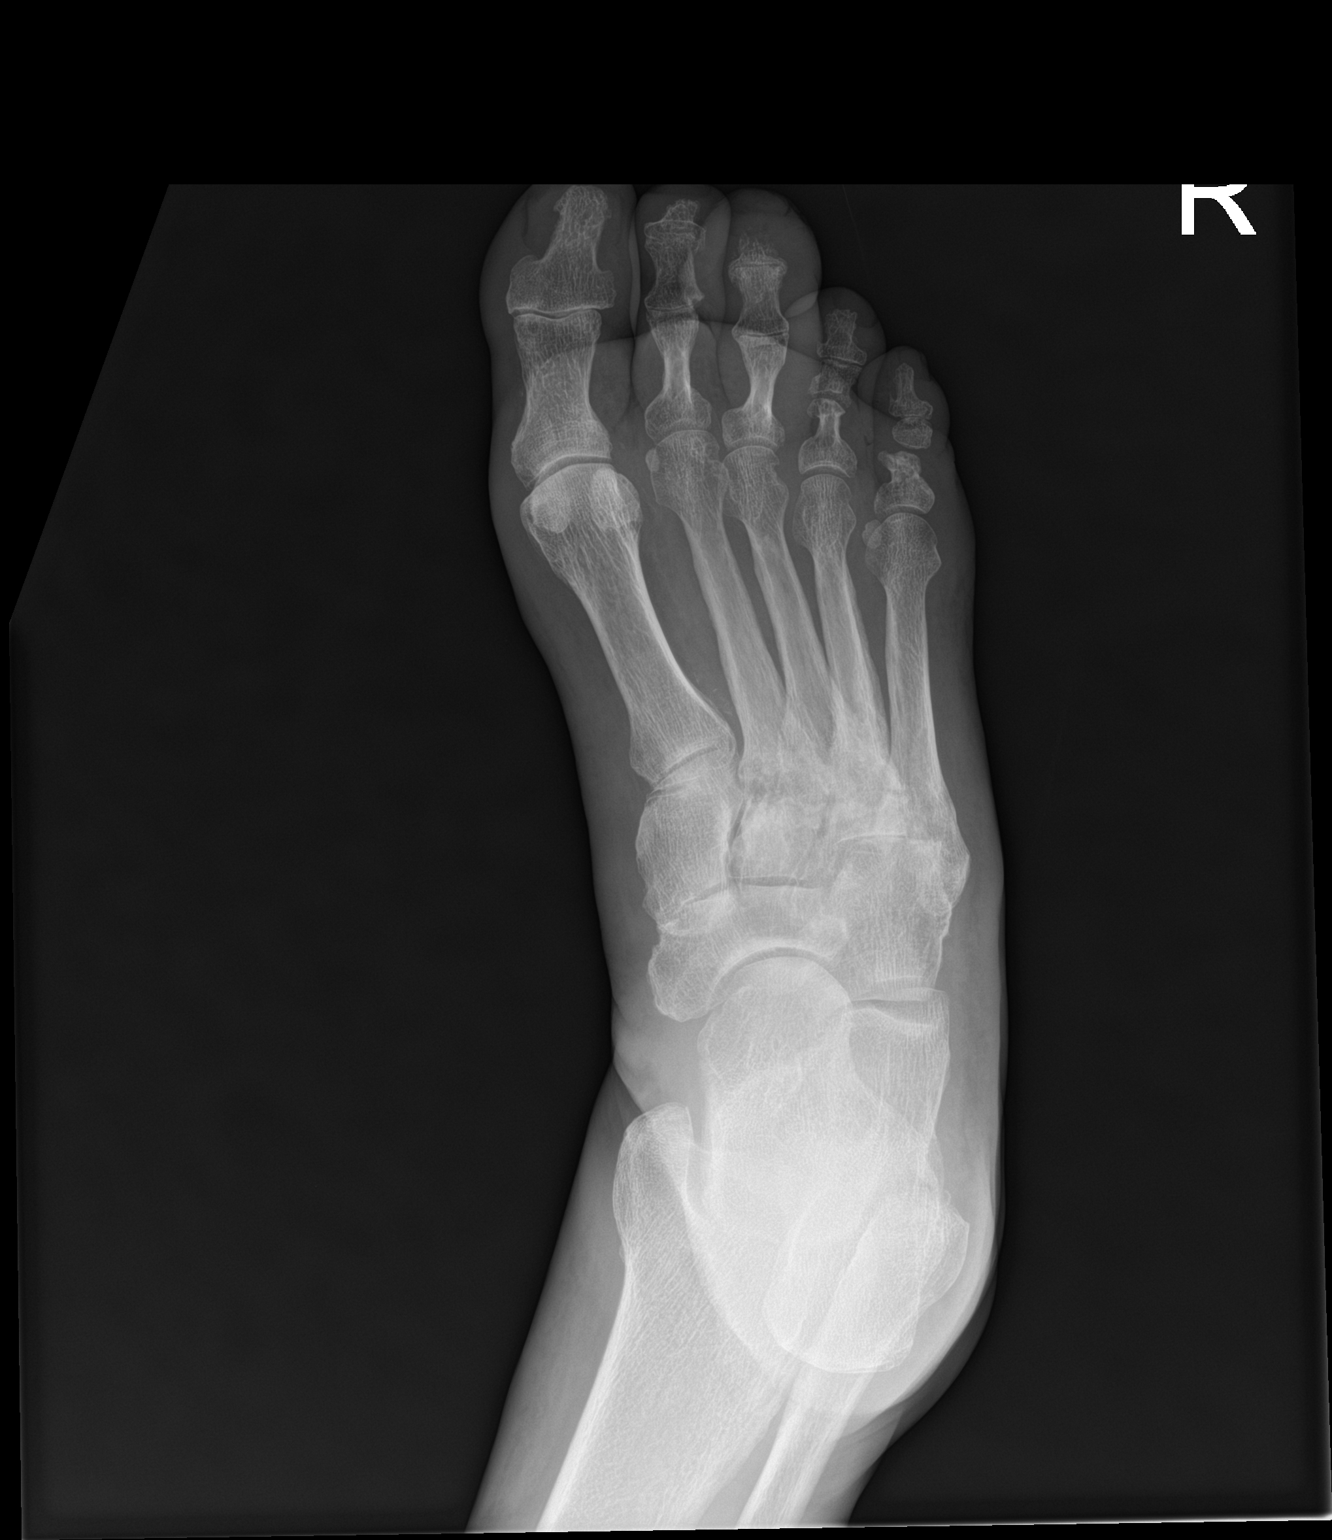

[foot ap]
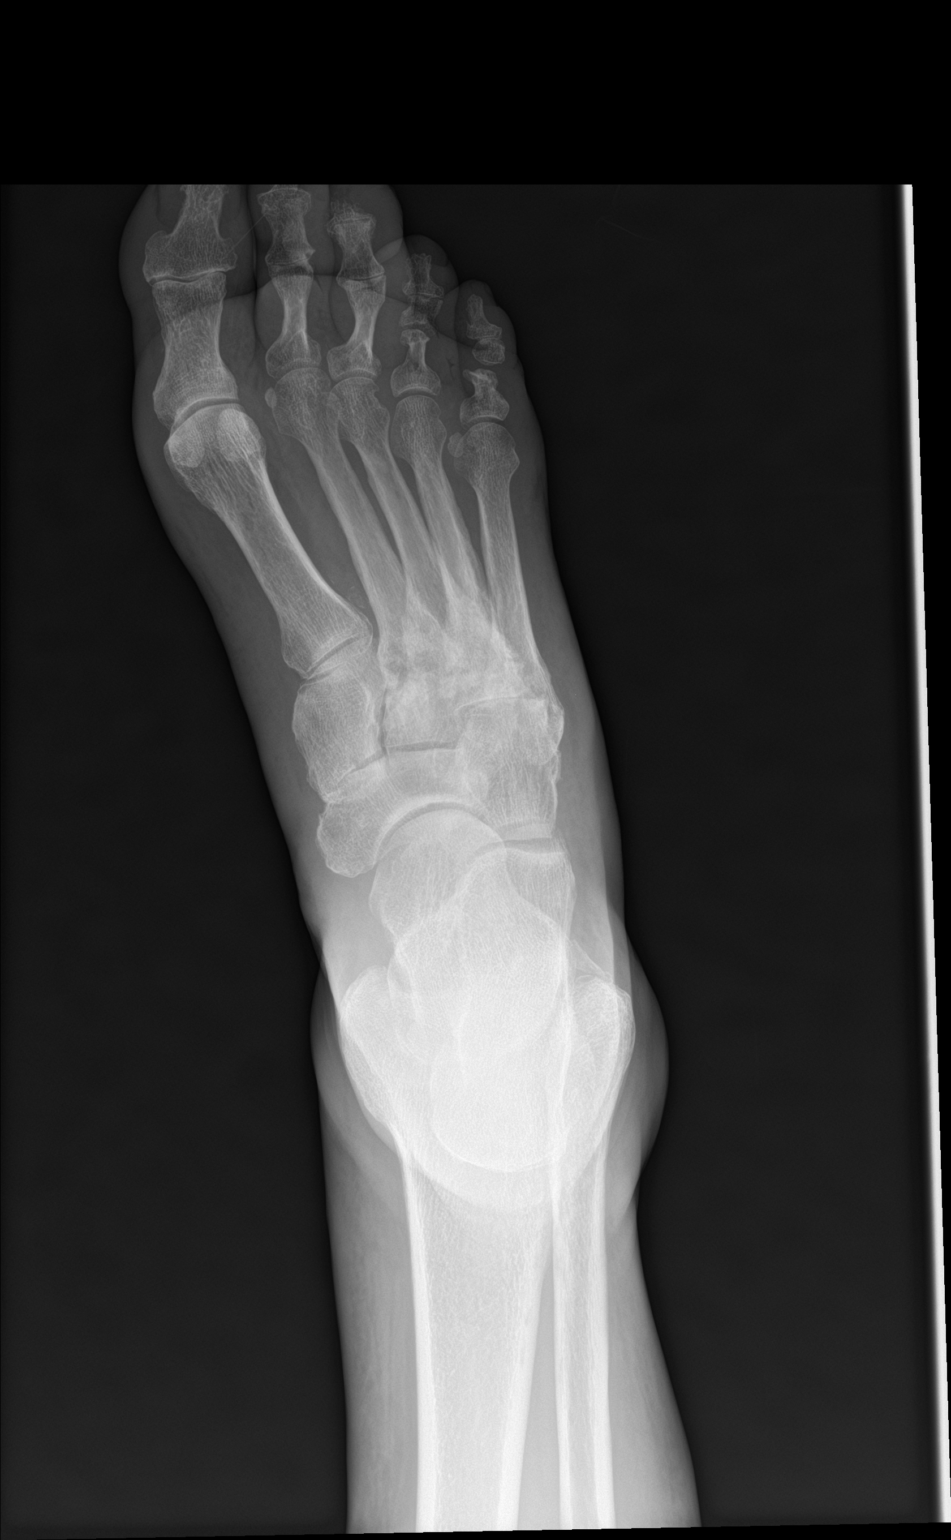

[foot lat]
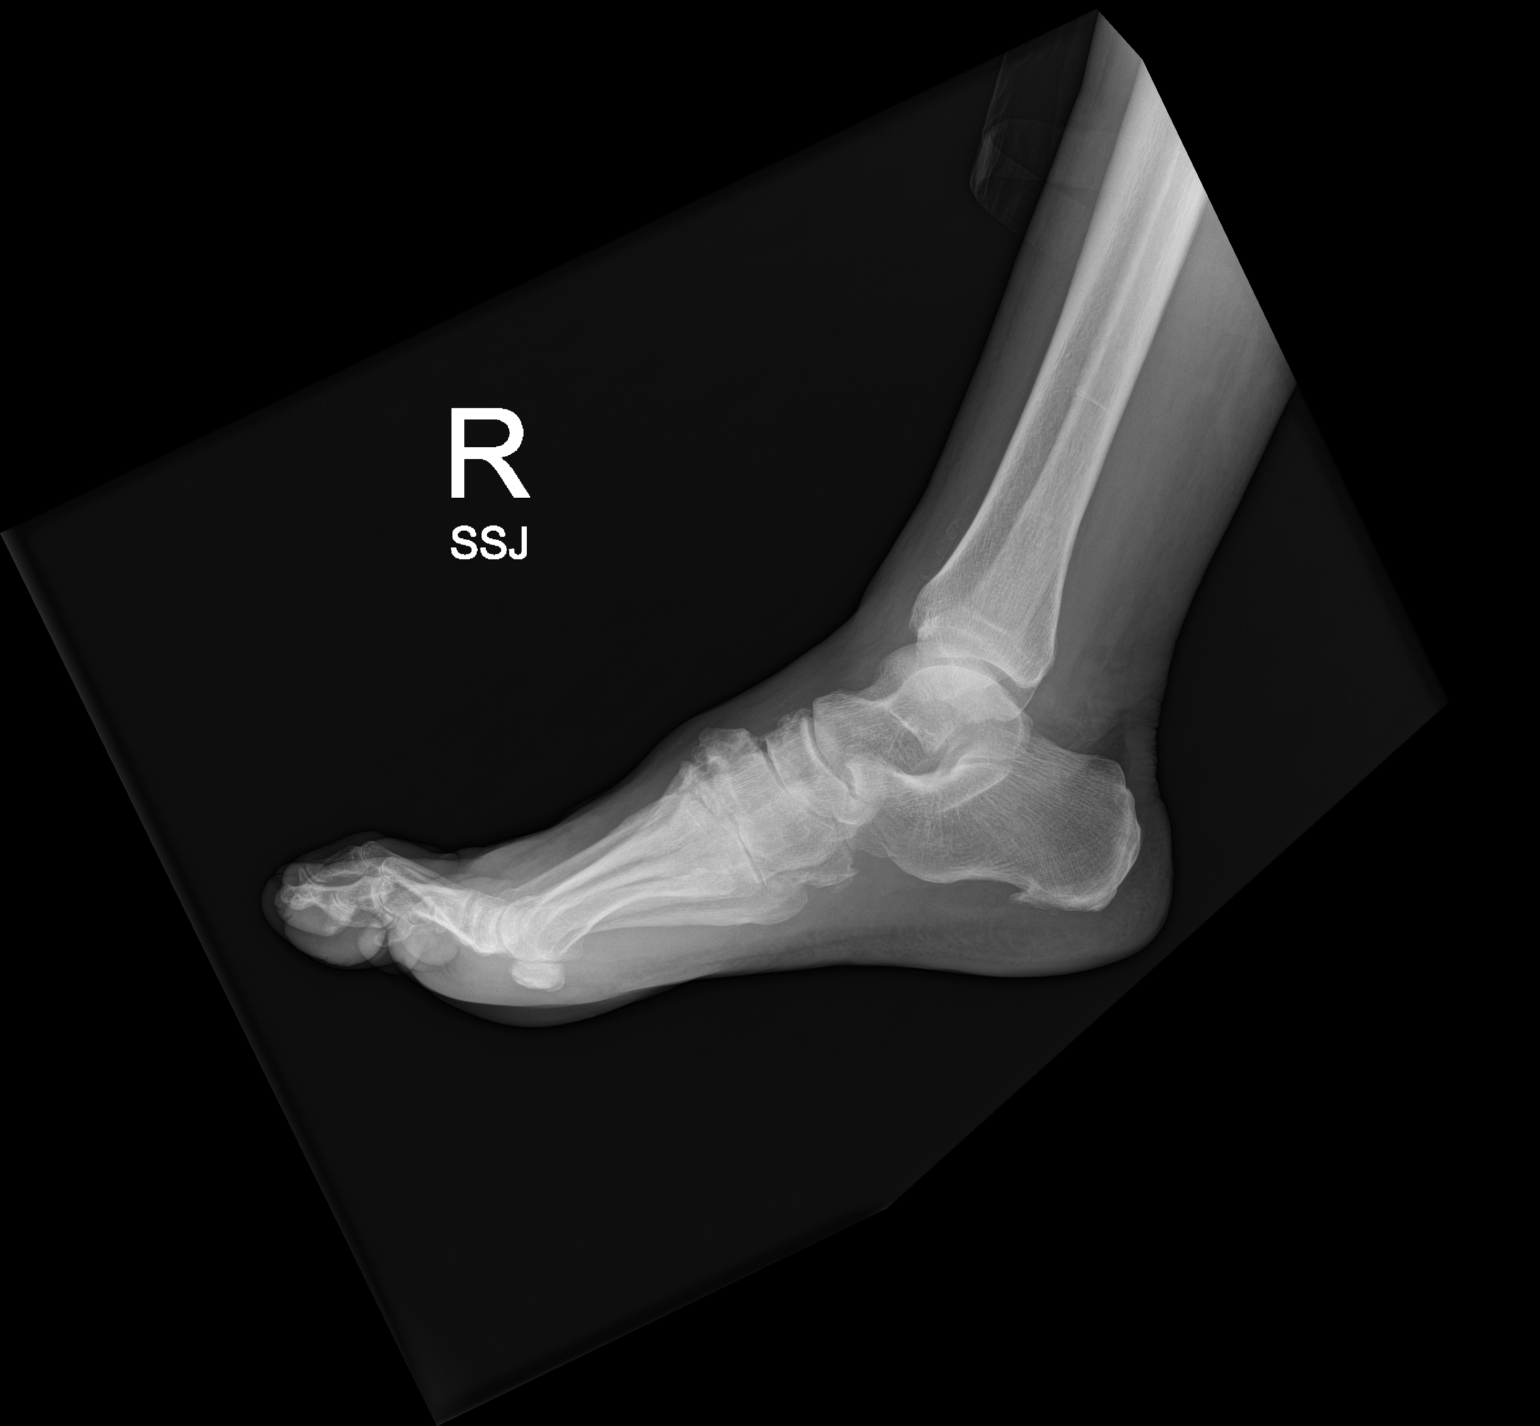

[3 of 3 positions shown; findings below may reference images not displayed]

FINDINGS: There is soft tissue swelling about the third digit. There are
erosions involving the distal phalanx of the third digit. There are
degenerative changes of the interphalangeal joints, greatest within
the fourth and fifth digits. There are degenerative changes of the
midfoot. There is a small plantar calcaneal spur. Vascular
calcifications are noted.
IMPRESSION: 1. Erosions involving the distal phalanx of the third digit
concerning for osteomyelitis.
2. Soft tissue swelling about the third digit.
3. Degenerative changes of the interphalangeal joints and midfoot.

## 2021-04-07 ENCOUNTER — Encounter: Payer: Self-pay | Admitting: Ophthalmology

## 2021-04-19 ENCOUNTER — Ambulatory Visit
Admission: RE | Admit: 2021-04-19 | Discharge: 2021-04-19 | Disposition: A | Discharge status: Home / Self Care | Payer: Medicare HMO | Attending: Ophthalmology | Admitting: Ophthalmology

## 2021-04-19 ENCOUNTER — Ambulatory Visit: Payer: Medicare HMO | Admitting: Anesthesiology

## 2021-04-19 ENCOUNTER — Encounter
Admission: RE | Disposition: A | Discharge status: Home / Self Care | Payer: Self-pay | Source: Home / Self Care | Attending: Ophthalmology

## 2021-04-19 DIAGNOSIS — H2511 Age-related nuclear cataract, right eye: Secondary | ICD-10-CM | POA: Insufficient documentation

## 2021-04-19 DIAGNOSIS — Z5309 Procedure and treatment not carried out because of other contraindication: Secondary | ICD-10-CM | POA: Diagnosis not present

## 2021-04-19 DIAGNOSIS — Z7984 Long term (current) use of oral hypoglycemic drugs: Secondary | ICD-10-CM | POA: Insufficient documentation

## 2021-04-19 DIAGNOSIS — Z79899 Other long term (current) drug therapy: Secondary | ICD-10-CM | POA: Insufficient documentation

## 2021-04-19 DIAGNOSIS — E1136 Type 2 diabetes mellitus with diabetic cataract: Secondary | ICD-10-CM | POA: Insufficient documentation

## 2021-04-19 DIAGNOSIS — Z87891 Personal history of nicotine dependence: Secondary | ICD-10-CM | POA: Diagnosis not present

## 2021-04-19 HISTORY — DX: Presence of dental prosthetic device (complete) (partial): Z97.2

## 2021-04-19 HISTORY — PX: REMOVAL RETAINED LENS: SHX6464

## 2021-04-19 LAB — GLUCOSE, CAPILLARY
Glucose-Capillary: 144 mg/dL — ABNORMAL HIGH (ref 70–99)
Glucose-Capillary: 163 mg/dL — ABNORMAL HIGH (ref 70–99)

## 2021-04-19 SURGERY — REMOVAL, RETAINED LENS MATTER
Anesthesia: Monitor Anesthesia Care | Site: Eye | Laterality: Right

## 2021-04-19 MED ORDER — LACTATED RINGERS IV SOLN: INTRAVENOUS | Status: DC

## 2021-04-19 MED ORDER — TRYPAN BLUE 0.06 % OP SOLN
OPHTHALMIC | Status: DC | PRN
  Administered 2021-04-19: 0.5 mL via INTRAOCULAR

## 2021-04-19 MED ORDER — FENTANYL CITRATE (PF) 100 MCG/2ML IJ SOLN
INTRAMUSCULAR | Status: DC | PRN
  Administered 2021-04-19: 50 ug via INTRAVENOUS

## 2021-04-19 MED ORDER — MOXIFLOXACIN HCL 0.5 % OP SOLN
OPHTHALMIC | Status: DC | PRN
  Administered 2021-04-19: 0.2 mL via OPHTHALMIC

## 2021-04-19 MED ORDER — LIDOCAINE HCL (PF) 2 % IJ SOLN
INTRAOCULAR | Status: DC | PRN
  Administered 2021-04-19: 1 mL via INTRAOCULAR

## 2021-04-19 MED ORDER — PHENYLEPHRINE HCL 10 % OP SOLN
1.0000 [drp] | OPHTHALMIC | Status: DC | PRN
  Administered 2021-04-19 (×3): 1 [drp] via OPHTHALMIC

## 2021-04-19 MED ORDER — CYCLOPENTOLATE HCL 2 % OP SOLN
1.0000 [drp] | OPHTHALMIC | Status: DC | PRN
  Administered 2021-04-19 (×3): 1 [drp] via OPHTHALMIC

## 2021-04-19 MED ORDER — TETRACAINE HCL 0.5 % OP SOLN
1.0000 [drp] | OPHTHALMIC | Status: DC | PRN
  Administered 2021-04-19 (×2): 1 [drp] via OPHTHALMIC

## 2021-04-19 MED ORDER — MIDAZOLAM HCL 2 MG/2ML IJ SOLN
INTRAMUSCULAR | Status: DC | PRN
  Administered 2021-04-19: 1 mg via INTRAVENOUS

## 2021-04-19 MED ORDER — SODIUM HYALURONATE 10 MG/ML IO SOLUTION
PREFILLED_SYRINGE | INTRAOCULAR | Status: DC | PRN
  Administered 2021-04-19: 0.55 mL via INTRAOCULAR

## 2021-04-19 MED ORDER — LIDOCAINE HCL (PF) 2 % IJ SOLN
INTRAOCULAR | Status: DC | PRN
  Administered 2021-04-19: .2 mL via INTRAOCULAR

## 2021-04-19 MED ORDER — SODIUM HYALURONATE 23MG/ML IO SOSY
PREFILLED_SYRINGE | INTRAOCULAR | Status: DC | PRN
  Administered 2021-04-19: 0.6 mL via INTRAOCULAR

## 2021-04-19 SURGICAL SUPPLY — 17 items
CANNULA ANT/CHMB 27GA (MISCELLANEOUS) ×6 IMPLANT
DISSECTOR HYDRO NUCLEUS 50X22 (MISCELLANEOUS) ×3 IMPLANT
GLOVE SURG SYN 8.5  E (GLOVE) ×1
GLOVE SURG SYN 8.5 E (GLOVE) ×2 IMPLANT
GOWN STRL REUS W/ TWL LRG LVL3 (GOWN DISPOSABLE) ×4 IMPLANT
GOWN STRL REUS W/TWL LRG LVL3 (GOWN DISPOSABLE) ×6
LENS IOL TECNIS EYHANCE 17.5 (Intraocular Lens) IMPLANT
MARKER SKIN DUAL TIP RULER LAB (MISCELLANEOUS) ×3 IMPLANT
PACK DR. KING ARMS (PACKS) IMPLANT
PACK EYE AFTER SURG (MISCELLANEOUS) ×3 IMPLANT
PACK OPTHALMIC (MISCELLANEOUS) IMPLANT
SUT ETHILON 10-0 CS-B-6CS-B-6 (SUTURE) ×3 IMPLANT
SUTURE EHLN 10-0 CS-B-6CS-B-6 (SUTURE) ×2 IMPLANT
SYR 3ML LL SCALE MARK (SYRINGE) ×3 IMPLANT
SYR TB 1ML LUER SLIP (SYRINGE) ×3 IMPLANT
WATER STERILE IRR 250ML POUR (IV SOLUTION) ×3 IMPLANT
WIPE NON LINTING 3.25X3.25 (MISCELLANEOUS) ×3 IMPLANT

## 2021-04-19 NOTE — Transfer of Care (Signed)
Immediate Anesthesia Transfer of Care Note  Patient: Alex MASTRANGELO Sr.  Procedure(s) Performed: CATARACT EXTRACTION PHACO AND INTRAOCULAR LENS PLACEMENT ([place]) RIGHT VISION BLUE case aborted (Right: Eye)  Patient Location: PACU  Anesthesia Type: MAC  Level of Consciousness: awake, alert  and patient cooperative  Airway and Oxygen Therapy: Patient Spontanous Breathing and Patient connected to supplemental oxygen  Post-op Assessment: Post-op Vital signs reviewed, Patient's Cardiovascular Status Stable, Respiratory Function Stable, Patent Airway and No signs of Nausea or vomiting  Post-op Vital Signs: Reviewed and stable  Complications: No notable events documented.

## 2021-04-19 NOTE — Anesthesia Preprocedure Evaluation (Addendum)
Anesthesia Evaluation  Patient identified by MRN, date of birth, ID band Patient awake    Reviewed: Allergy & Precautions, H&P , NPO status , Patient's Chart, lab work & pertinent test results  Airway Mallampati: II  TM Distance: >3 FB Neck ROM: full    Dental  (+) Edentulous Upper, Edentulous Lower   Pulmonary former smoker,    Pulmonary exam normal        Cardiovascular hypertension, + Peripheral Vascular Disease  Normal cardiovascular exam     Neuro/Psych cataract negative psych ROS   GI/Hepatic Neg liver ROS, GERD  Medicated,  Endo/Other  diabetes, Type 2, Oral Hypoglycemic Agents  Renal/GU Renal InsufficiencyRenal disease  negative genitourinary   Musculoskeletal  (+) Arthritis , Osteoarthritis,    Abdominal Normal abdominal exam  (+)   Peds negative pediatric ROS (+)  Hematology negative hematology ROS (+)   Anesthesia Other Findings   Reproductive/Obstetrics negative OB ROS                             Anesthesia Physical  Anesthesia Plan  ASA: 3  Anesthesia Plan: MAC   Post-op Pain Management:    Induction: Intravenous  PONV Risk Score and Plan: 1 and TIVA, Midazolam and Treatment may vary due to age or medical condition  Airway Management Planned: Nasal Cannula and Natural Airway  Additional Equipment:   Intra-op Plan:   Post-operative Plan:   Informed Consent: I have reviewed the patients History and Physical, chart, labs and discussed the procedure including the risks, benefits and alternatives for the proposed anesthesia with the patient or authorized representative who has indicated his/her understanding and acceptance.     Dental advisory given  Plan Discussed with: CRNA  Anesthesia Plan Comments:         Anesthesia Quick Evaluation

## 2021-04-19 NOTE — Anesthesia Postprocedure Evaluation (Signed)
Anesthesia Post Note  Patient: Alex CODNER Sr.  Procedure(s) Performed: CATARACT EXTRACTION PHACO AND INTRAOCULAR LENS PLACEMENT ([place]) RIGHT VISION BLUE 0.20 00:01.4 case aborted (Right: Eye)     Patient location during evaluation: PACU Anesthesia Type: MAC Level of consciousness: awake and alert Pain management: pain level controlled Vital Signs Assessment: post-procedure vital signs reviewed and stable Respiratory status: nonlabored ventilation and spontaneous breathing Cardiovascular status: blood pressure returned to baseline Postop Assessment: no apparent nausea or vomiting Anesthetic complications: no   No notable events documented.  Cinthia Rodden [employer]

## 2021-04-19 NOTE — Op Note (Signed)
OPERATIVE NOTE  Alex Soto [id number] 04/19/2021   PREOPERATIVE DIAGNOSIS:    Nuclear sclerotic cataract right eye.  H25.11  History of ocular trauma.   POSTOPERATIVE DIAGNOSIS:      Nuclear sclerotic cataract right eye.   History of ocular trauma, right eye.   PROCEDURE:  CPT [id number] Removal of lens material without placement of intraocular lens   ULTRASOUND TIME: 1 seconds.  CDE 0.20   SURGEON:  Willey Blade, MD, MPH  ANESTHESIOLOGIST: Anesthesiologist: Fletcher Anon, MD CRNA: Maree Krabbe, CRNA   ANESTHESIA:  Topical with tetracaine drops augmented with 1% preservative-free intracameral lidocaine.  ESTIMATED BLOOD LOSS: less than 1 mL.   COMPLICATIONS:  immediate, total loss of lens (entire nucleus dropped) to posterior segment..   DESCRIPTION OF PROCEDURE:  The patient was identified in the holding room and transported to the operating room and placed in the supine position under the operating microscope.  The right eye was identified as the operative eye and it was prepped and draped in the usual sterile ophthalmic fashion.   A 1.0 millimeter clear-corneal paracentesis was made at the 10:30 position. 0.5 ml of preservative-free 1% lidocaine with epinephrine was injected into the anterior chamber.  The anterior chamber was filled with Healon 5 viscoelastic.  A 2.4 millimeter keratome was used to make a near-clear corneal incision at the 8:00 position.    There was a significant cortical cataract inferiorly so trypan blue was injected in the standard fashion.  A curvilinear capsulorrhexis was made with a cystotome and capsulorrhexis forceps.  Balanced salt solution was used to hydrodissect and hydrodelineate the nucleus.   The phaco needle was inserted and upon removal of some of the anterior cortical material the lens began to shift posteriorly rapidly.  The anterior capsule remained intact and well positioned.  The entire lens was lost to the posterior segment.  At  this point the decision was made to refer the patient to a retina specialist for vitrectomy and removal of the lens and placement of an IOL.   The Healon 5 was carefully irrigated out of the anterior chamber.  A 10-0 nylon was placed through the main wound.  Wounds were hydrated with balanced salt solution.  The anterior chamber was inflated to a physiologic pressure with balanced salt solution.   Intracameral vigamox 0.1 mL undiluted was injected into the eye and a drop placed onto the ocular surface.  No wound leaks were noted.  The patient was taken to the recovery room in stable condition.  The complication was disclosed to the patient.  Willey Blade 04/19/2021, 8:48 AM

## 2021-04-19 NOTE — H&P (Signed)
[facility]   Primary Care Physician:  [name], MD Ophthalmologist: Dr. Willey Blade  Pre-Procedure History & Physical: HPI:  Alex Soto. is a 75 y.o. male here for cataract surgery.   Past Medical History:  Diagnosis Date   Anemia    Diabetes mellitus without complication (HCC)    GERD (gastroesophageal reflux disease)    History of kidney stones    Kidney stones    Wears dentures    HAS full upper and lower.  Only wears upper    Past Surgical History:  Procedure Laterality Date   AMPUTATION TOE Right 03/01/2020   Procedure: AMPUTATION TOE;  Surgeon: [name], DPM;  Location: [facility];  Service: Podiatry;  Laterality: Right;   APPENDECTOMY     EYE SURGERY     age 64   KIDNEY STONE SURGERY     SHOULDER ARTHROSCOPY WITH BICEPS TENDON REPAIR Right 10/06/2016   Procedure: SHOULDER ARTHROSCOPY WITH BICEPS TENDON REPAIR;  Surgeon: Christena Flake, MD;  Location: [facility];  Service: Orthopedics;  Laterality: Right;   SHOULDER ARTHROSCOPY WITH OPEN ROTATOR CUFF REPAIR Right 10/06/2016   Procedure: SHOULDER ARTHROSCOPY WITH OPEN ROTATOR CUFF REPAIR;  Surgeon: Christena Flake, MD;  Location: [facility];  Service: Orthopedics;  Laterality: Right;   SHOULDER ARTHROSCOPY WITH SUBACROMIAL DECOMPRESSION Right 10/06/2016   Procedure: SHOULDER ARTHROSCOPY WITH SUBACROMIAL DECOMPRESSION AND DEBRIDEMENT;  Surgeon: Christena Flake, MD;  Location: [facility];  Service: Orthopedics;  Laterality: Right;   TONSILLECTOMY      Prior to Admission medications   Medication Sig Start Date End Date Taking? Authorizing Provider  aspirin 81 MG tablet Take 81 mg by mouth at bedtime.    Yes [provider]  atorvastatin (LIPITOR) 40 MG tablet Take 40 mg by mouth daily.   Yes [provider]  glyBURIDE-metformin (GLUCOVANCE) 2.5-500 MG tablet Take 1 tablet by mouth at bedtime.    Yes [provider]  pioglitazone (ACTOS) 15 MG tablet Take 15 mg by mouth at bedtime.     Yes [provider]    Allergies as of 02/26/2021   (No Known Allergies)    History reviewed. No pertinent family history.  Social History   Socioeconomic History   Marital status: Married    Spouse name: Not on file   Number of children: Not on file   Years of education: Not on file   Highest education level: Not on file  Occupational History   Not on file  Tobacco Use   Smoking status: Former    Packs/day: 0.25    Years: 10.00    Pack years: 2.50    Types: Cigarettes    Quit date: 09/30/1969    Years since quitting: 51.5   Smokeless tobacco: Former  Building services engineer Use: Never used  Substance and Sexual Activity   Alcohol use: No   Drug use: No   Sexual activity: Not on file  Other Topics Concern   Not on file  Social History Narrative   Not on file   Social Determinants of Health   Financial Resource Strain: Not on file  Food Insecurity: Not on file  Transportation Needs: Not on file  Physical Activity: Not on file  Stress: Not on file  Social Connections: Not on file  Intimate Partner Violence: Not on file    Review of Systems: See HPI, otherwise negative ROS  Physical Exam: BP (!) 169/65   Pulse (!) 58  Temp 98 F (36.7 C)   Resp 18   Ht 6\' 1"  (1.854 m)   Wt 78 kg   SpO2 100%   BMI 22.69 kg/m  General:   Alert,  pleasant and cooperative in NAD Head:  Normocephalic and atraumatic. Respiratory:  Normal work of breathing. Cardiovascular:  RRR  Impression/Plan: Sr. is here for cataract surgery.  Risks, benefits, limitations, and alternatives regarding cataract surgery have been reviewed with the patient.  Questions have been answered.  All parties agreeable.   [name], MD  04/19/2021, 8:04 AM

## 2021-04-27 ENCOUNTER — Encounter: Payer: Self-pay | Admitting: Anesthesiology

## 2021-04-27 NOTE — Anesthesia Preprocedure Evaluation (Deleted)
Anesthesia Evaluation    Reviewed: H&P   Airway Mallampati: II  TM Distance: >3 FB Neck ROM: full    Dental  (+) Edentulous Upper, Edentulous Lower   Pulmonary former smoker (quit 1970),           Cardiovascular hypertension, + Peripheral Vascular Disease       Neuro/Psych cataract    GI/Hepatic Neg liver ROS, GERD  Medicated,  Endo/Other  diabetes, Type 2, Oral Hypoglycemic Agents  Renal/GU Renal disease (stage II CKD)     Musculoskeletal  (+) Arthritis ,   Abdominal   Peds negative pediatric ROS (+)  Hematology negative hematology ROS (+)   Anesthesia Other Findings   Reproductive/Obstetrics                             Anesthesia Physical  Anesthesia Plan  ASA: 3  Anesthesia Plan: MAC   Post-op Pain Management:    Induction: Intravenous  PONV Risk Score and Plan: 1 and TIVA, Midazolam and Treatment may vary due to age or medical condition  Airway Management Planned: Nasal Cannula  Additional Equipment:   Intra-op Plan:   Post-operative Plan:   Informed Consent:   Plan Discussed with:   Anesthesia Plan Comments:         Anesthesia Quick Evaluation

## 2021-04-28 ENCOUNTER — Encounter: Payer: Self-pay | Admitting: Ophthalmology

## 2021-05-10 ENCOUNTER — Ambulatory Visit: Admission: RE | Admit: 2021-05-10 | Payer: Medicare HMO | Source: Ambulatory Visit | Admitting: Ophthalmology

## 2021-05-10 SURGERY — PHACOEMULSIFICATION, CATARACT, WITH IOL INSERTION
Anesthesia: Topical | Laterality: Left

## 2021-07-06 ENCOUNTER — Encounter: Payer: Self-pay | Admitting: Ophthalmology

## 2021-07-06 ENCOUNTER — Encounter: Payer: Self-pay | Admitting: Anesthesiology

## 2021-07-09 NOTE — Anesthesia Preprocedure Evaluation (Deleted)
Anesthesia Evaluation    Airway        Dental  (+) Upper Dentures, Lower Dentures   Pulmonary former smoker,           Cardiovascular hypertension, + Peripheral Vascular Disease     HLD   Neuro/Psych    GI/Hepatic GERD  ,  Endo/Other  diabetes, Type 2  Renal/GU CRFRenal disease (Stones)     Musculoskeletal   Abdominal   Peds  Hematology  (+) anemia ,   Anesthesia Other Findings   Reproductive/Obstetrics                             Anesthesia Physical Anesthesia Plan  ASA: 2  Anesthesia Plan: MAC   Post-op Pain Management:    Induction: Intravenous  PONV Risk Score and Plan: 1 and TIVA, Midazolam and Treatment may vary due to age or medical condition  Airway Management Planned: Nasal Cannula  Additional Equipment:   Intra-op Plan:   Post-operative Plan:   Informed Consent: I have reviewed the patients History and Physical, chart, labs and discussed the procedure including the risks, benefits and alternatives for the proposed anesthesia with the patient or authorized representative who has indicated his/her understanding and acceptance.       Plan Discussed with: CRNA and Anesthesiologist  Anesthesia Plan Comments:         Anesthesia Quick Evaluation

## 2021-07-09 NOTE — Discharge Instructions (Signed)

## 2021-08-19 ENCOUNTER — Encounter: Payer: Self-pay | Admitting: Ophthalmology

## 2021-09-03 ENCOUNTER — Ambulatory Visit: Admit: 2021-09-03 | Payer: Medicare HMO | Admitting: Internal Medicine

## 2021-09-03 SURGERY — COLONOSCOPY
Anesthesia: General

## 2021-09-06 ENCOUNTER — Other Ambulatory Visit: Payer: Self-pay

## 2021-09-06 ENCOUNTER — Ambulatory Visit
Admission: RE | Admit: 2021-09-06 | Discharge: 2021-09-06 | Disposition: A | Discharge status: Home / Self Care | Payer: Medicare HMO | Attending: Ophthalmology | Admitting: Ophthalmology

## 2021-09-06 ENCOUNTER — Ambulatory Visit: Payer: Self-pay | Admitting: Anesthesiology

## 2021-09-06 ENCOUNTER — Encounter: Payer: Self-pay | Admitting: Anesthesiology

## 2021-09-06 ENCOUNTER — Encounter: Payer: Self-pay | Admitting: Ophthalmology

## 2021-09-06 ENCOUNTER — Encounter
Admission: RE | Disposition: A | Discharge status: Home / Self Care | Payer: Self-pay | Source: Home / Self Care | Attending: Ophthalmology

## 2021-09-06 DIAGNOSIS — H2512 Age-related nuclear cataract, left eye: Secondary | ICD-10-CM | POA: Insufficient documentation

## 2021-09-06 DIAGNOSIS — E1136 Type 2 diabetes mellitus with diabetic cataract: Secondary | ICD-10-CM | POA: Insufficient documentation

## 2021-09-06 HISTORY — PX: CATARACT EXTRACTION W/PHACO: SHX586

## 2021-09-06 LAB — GLUCOSE, CAPILLARY
Glucose-Capillary: 101 mg/dL — ABNORMAL HIGH (ref 70–99)
Glucose-Capillary: 129 mg/dL — ABNORMAL HIGH (ref 70–99)

## 2021-09-06 SURGERY — PHACOEMULSIFICATION, CATARACT, WITH IOL INSERTION
Anesthesia: Monitor Anesthesia Care | Site: Eye | Laterality: Left

## 2021-09-06 MED ORDER — LACTATED RINGERS IV SOLN: INTRAVENOUS | Status: DC

## 2021-09-06 MED ORDER — LIDOCAINE HCL (PF) 2 % IJ SOLN
INTRAOCULAR | Status: DC | PRN
  Administered 2021-09-06: 1 mL via INTRAOCULAR

## 2021-09-06 MED ORDER — SIGHTPATH DOSE#1 SODIUM HYALURONATE 10 MG/ML IO SOLUTION
PREFILLED_SYRINGE | INTRAOCULAR | Status: DC | PRN
  Administered 2021-09-06: 0.85 mL via INTRAOCULAR

## 2021-09-06 MED ORDER — FENTANYL CITRATE (PF) 100 MCG/2ML IJ SOLN
INTRAMUSCULAR | Status: DC | PRN
  Administered 2021-09-06: 50 ug via INTRAVENOUS
  Administered 2021-09-06 (×2): 25 ug via INTRAVENOUS

## 2021-09-06 MED ORDER — SIGHTPATH DOSE#1 BSS IO SOLN
INTRAOCULAR | Status: DC | PRN
  Administered 2021-09-06: 30.6 mL via OPHTHALMIC

## 2021-09-06 MED ORDER — TETRACAINE HCL 0.5 % OP SOLN
1.0000 [drp] | OPHTHALMIC | Status: DC | PRN
  Administered 2021-09-06 (×3): 1 [drp] via OPHTHALMIC

## 2021-09-06 MED ORDER — SIGHTPATH DOSE#1 BSS IO SOLN
INTRAOCULAR | Status: DC | PRN
  Administered 2021-09-06: 15 mL

## 2021-09-06 MED ORDER — CEFUROXIME OPHTHALMIC INJECTION 1 MG/0.1 ML
INJECTION | OPHTHALMIC | Status: DC | PRN
  Administered 2021-09-06: 0.1 mL via INTRACAMERAL

## 2021-09-06 MED ORDER — ACETAMINOPHEN 500 MG PO TABS: 1000.0000 mg | ORAL_TABLET | Freq: Once | ORAL | Status: DC | PRN

## 2021-09-06 MED ORDER — MIDAZOLAM HCL 2 MG/2ML IJ SOLN
INTRAMUSCULAR | Status: DC | PRN
  Administered 2021-09-06 (×2): 1 mg via INTRAVENOUS

## 2021-09-06 MED ORDER — ARMC OPHTHALMIC DILATING DROPS
1.0000 "application " | OPHTHALMIC | Status: AC | PRN
  Administered 2021-09-06 (×3): 1 via OPHTHALMIC

## 2021-09-06 MED ORDER — SIGHTPATH DOSE#1 SODIUM HYALURONATE 23 MG/ML IO SOLUTION
PREFILLED_SYRINGE | INTRAOCULAR | Status: DC | PRN
  Administered 2021-09-06: 0.6 mL via INTRAOCULAR

## 2021-09-06 MED ORDER — ONDANSETRON HCL 4 MG/2ML IJ SOLN: 4.0000 mg | Freq: Once | INTRAMUSCULAR | Status: DC | PRN

## 2021-09-06 MED ORDER — ACETAMINOPHEN 160 MG/5ML PO SOLN: 975.0000 mg | Freq: Once | ORAL | Status: DC | PRN

## 2021-09-06 SURGICAL SUPPLY — 14 items
CANNULA ANT/CHMB 27GA (MISCELLANEOUS) IMPLANT
DISSECTOR HYDRO NUCLEUS 50X22 (MISCELLANEOUS) ×2 IMPLANT
GLOVE SURG GAMMEX PI TX LF 7.5 (GLOVE) ×2 IMPLANT
GLOVE SURG SYN 8.5  E (GLOVE) ×1
GLOVE SURG SYN 8.5 E (GLOVE) ×1 IMPLANT
GOWN STRL REUS W/ TWL LRG LVL3 (GOWN DISPOSABLE) ×2 IMPLANT
GOWN STRL REUS W/TWL LRG LVL3 (GOWN DISPOSABLE) ×4
LENS IOL TECNIS EYHANCE 18.0 (Intraocular Lens) ×2 IMPLANT
MARKER SKIN DUAL TIP RULER LAB (MISCELLANEOUS) IMPLANT
PACK EYE AFTER SURG (MISCELLANEOUS) ×2 IMPLANT
SYR 3ML LL SCALE MARK (SYRINGE) ×2 IMPLANT
SYR TB 1ML LUER SLIP (SYRINGE) ×2 IMPLANT
WATER STERILE IRR 250ML POUR (IV SOLUTION) ×2 IMPLANT
WIPE NON LINTING 3.25X3.25 (MISCELLANEOUS) ×2 IMPLANT

## 2021-09-06 NOTE — Op Note (Signed)
OPERATIVE NOTE  Alex Soto [id number] 09/06/2021   PREOPERATIVE DIAGNOSIS:  Nuclear sclerotic cataract left eye.  H25.12   POSTOPERATIVE DIAGNOSIS:    Nuclear sclerotic cataract left eye.     PROCEDURE:  Phacoemusification with posterior chamber intraocular lens placement of the left eye   LENS:   Implant Name Type Inv. Item Serial No. Manufacturer Lot No. LRB No. Used Action  LENS IOL TECNIS EYHANCE 18.0 - [id number] Intraocular Lens LENS IOL TECNIS EYHANCE 18.0 [id number] JOHNSON   Left 1 Implanted      Procedure(s) with comments: CATARACT EXTRACTION PHACO AND INTRAOCULAR LENS PLACEMENT ([facility]) LEFT 2.71 00:30.6 (Left) - Diabetic  DIB00 +18.0   ULTRASOUND TIME: 0 minutes 30 seconds.  CDE 2.71   SURGEON:  Willey Blade, MD, MPH   ANESTHESIA:  Topical with tetracaine drops augmented with 1% preservative-free intracameral lidocaine.  ESTIMATED BLOOD LOSS: <1 mL   COMPLICATIONS:  None.   DESCRIPTION OF PROCEDURE:  The patient was identified in the holding room and transported to the operating room and placed in the supine position under the operating microscope.  The left eye was identified as the operative eye and it was prepped and draped in the usual sterile ophthalmic fashion.   A 1.0 millimeter clear-corneal paracentesis was made at the 5:00 position. 0.5 ml of preservative-free 1% lidocaine with epinephrine was injected into the anterior chamber.  The anterior chamber was filled with Healon 5 viscoelastic.  A 2.4 millimeter keratome was used to make a near-clear corneal incision at the 2:00 position.  A curvilinear capsulorrhexis was made with a cystotome and capsulorrhexis forceps.  Balanced salt solution was used to hydrodissect and hydrodelineate the nucleus.   Phacoemulsification was then used in stop and chop fashion to remove the lens nucleus and epinucleus.  The remaining cortex was then removed using the irrigation and aspiration handpiece. Healon was then placed  into the capsular bag to distend it for lens placement.  A lens was then injected into the capsular bag.  The remaining viscoelastic was aspirated.   Wounds were hydrated with balanced salt solution.  The anterior chamber was inflated to a physiologic pressure with balanced salt solution.  Intracameral cefuroxime 0.1 mL at 10 mg/mL was injected into the eye.  No wound leaks were noted.  The patient was taken to the recovery room in stable condition without complications of anesthesia or surgery  Willey Blade 09/06/2021, 7:58 AM

## 2021-09-06 NOTE — Transfer of Care (Signed)
Immediate Anesthesia Transfer of Care Note  Patient: Alex MCNAMEE Sr.  Procedure(s) Performed: CATARACT EXTRACTION PHACO AND INTRAOCULAR LENS PLACEMENT ([facility]) LEFT 2.71 00:30.6 (Left: Eye)  Patient Location: PACU  Anesthesia Type: MAC  Level of Consciousness: awake, alert  and patient cooperative  Airway and Oxygen Therapy: Patient Spontanous Breathing and Patient connected to supplemental oxygen  Post-op Assessment: Post-op Vital signs reviewed, Patient's Cardiovascular Status Stable, Respiratory Function Stable, Patent Airway and No signs of Nausea or vomiting  Post-op Vital Signs: Reviewed and stable  Complications: No notable events documented.

## 2021-09-06 NOTE — H&P (Signed)
[facility]   Primary Care Physician:  [name], MD Ophthalmologist: Dr. Willey Blade  Pre-Procedure History & Physical: HPI:  Alex Soto. is a 75 y.o. male here for cataract surgery.   Past Medical History:  Diagnosis Date   Anemia    Diabetes mellitus without complication (HCC)    GERD (gastroesophageal reflux disease)    History of kidney stones    Kidney stones    Wears dentures    HAS full upper and lower.  Only wears upper    Past Surgical History:  Procedure Laterality Date   AMPUTATION TOE Right 03/01/2020   Procedure: AMPUTATION TOE;  Surgeon: [name], DPM;  Location: [facility];  Service: Podiatry;  Laterality: Right;   APPENDECTOMY     EYE SURGERY     age 52   KIDNEY STONE SURGERY     REMOVAL RETAINED LENS Right 04/19/2021   Procedure: CATARACT EXTRACTION PHACO,  RIGHT VISION BLUE 0.20 00:01.4 case aborted;  Surgeon: Nevada Crane, MD;  [facility];  Service: Ophthalmology;  Laterality: Right;  Diabetic - oral meds   SHOULDER ARTHROSCOPY WITH BICEPS TENDON REPAIR Right 10/06/2016   Procedure: SHOULDER ARTHROSCOPY WITH BICEPS TENDON REPAIR;  Surgeon: Christena Flake, MD;  Location: [facility];  Service: Orthopedics;  Laterality: Right;   SHOULDER ARTHROSCOPY WITH OPEN ROTATOR CUFF REPAIR Right 10/06/2016   Procedure: SHOULDER ARTHROSCOPY WITH OPEN ROTATOR CUFF REPAIR;  Surgeon: Christena Flake, MD;  Location: [facility];  Service: Orthopedics;  Laterality: Right;   SHOULDER ARTHROSCOPY WITH SUBACROMIAL DECOMPRESSION Right 10/06/2016   Procedure: SHOULDER ARTHROSCOPY WITH SUBACROMIAL DECOMPRESSION AND DEBRIDEMENT;  Surgeon: Christena Flake, MD;  Location: [facility];  Service: Orthopedics;  Laterality: Right;   TONSILLECTOMY      Prior to Admission medications   Medication Sig Start Date End Date Taking? Authorizing Provider  aspirin 81 MG tablet Take 81 mg by mouth at bedtime.    Yes [provider]  atorvastatin (LIPITOR)  40 MG tablet Take 40 mg by mouth daily.   Yes [provider]  glyBURIDE-metformin (GLUCOVANCE) 2.5-500 MG tablet Take 1 tablet by mouth at bedtime.    Yes [provider]  pioglitazone (ACTOS) 15 MG tablet Take 15 mg by mouth at bedtime.    Yes [provider]    Allergies as of 07/06/2021   (No Known Allergies)    History reviewed. No pertinent family history.  Social History   Socioeconomic History   Marital status: Married    Spouse name: Not on file   Number of children: Not on file   Years of education: Not on file   Highest education level: Not on file  Occupational History   Not on file  Tobacco Use   Smoking status: Former    Packs/day: 0.25    Years: 10.00    Pack years: 2.50    Types: Cigarettes    Quit date: 09/30/1969    Years since quitting: 51.9   Smokeless tobacco: Former  Building services engineer Use: Never used  Substance and Sexual Activity   Alcohol use: No   Drug use: No   Sexual activity: Not on file  Other Topics Concern   Not on file  Social History Narrative   Not on file   Social Determinants of Health   Financial Resource Strain: Not on file  Food Insecurity: Not on file  Transportation Needs: Not on file  Physical Activity: Not  on file  Stress: Not on file  Social Connections: Not on file  Intimate Partner Violence: Not on file    Review of Systems: See HPI, otherwise negative ROS  Physical Exam: BP (!) 159/76   Pulse (!) 57   Temp 98.8 F (37.1 C) (Temporal)   Resp 16   Ht 6\' 1"  (1.854 m)   Wt 73 kg   SpO2 100%   BMI 21.24 kg/m  General:   Alert, cooperative in NAD Head:  Normocephalic and atraumatic. Respiratory:  Normal work of breathing. Cardiovascular:  RRR  Impression/Plan: Sr. is here for cataract surgery.  Risks, benefits, limitations, and alternatives regarding cataract surgery have been reviewed with the patient.  Questions have been answered.  All parties  agreeable.   [name], MD  09/06/2021, 7:12 AM

## 2021-09-06 NOTE — Anesthesia Procedure Notes (Signed)
Procedure Name: MAC Date/Time: 09/06/2021 7:32 AM Performed by: Jeannene Patella, CRNA Pre-anesthesia Checklist: Patient identified, Emergency Drugs available, Suction available, Timeout performed and Patient being monitored Patient Re-evaluated:Patient Re-evaluated prior to induction Oxygen Delivery Method: Nasal cannula Placement Confirmation: positive ETCO2

## 2021-09-06 NOTE — Anesthesia Preprocedure Evaluation (Signed)
Anesthesia Evaluation  Patient identified by MRN, date of birth, ID band Patient awake    Reviewed: Allergy & Precautions, H&P , NPO status , Patient's Chart, lab work & pertinent test results, reviewed documented beta blocker date and time   History of Anesthesia Complications Negative for: history of anesthetic complications  Airway Mallampati: III  TM Distance: >3 FB Neck ROM: full    Dental  (+) Edentulous Upper, Edentulous Lower   Pulmonary former smoker,    Pulmonary exam normal        Cardiovascular hypertension, (-) angina+ Peripheral Vascular Disease  (-) DOE Normal cardiovascular exam   HLD   Neuro/Psych cataract negative psych ROS   GI/Hepatic GERD  Medicated,  Endo/Other  diabetes, Type 2, Oral Hypoglycemic Agents  Renal/GU Renal InsufficiencyRenal disease  negative genitourinary   Musculoskeletal  (+) Arthritis , Osteoarthritis,    Abdominal Normal abdominal exam  (+)   Peds  Hematology  (+) anemia ,   Anesthesia Other Findings   Reproductive/Obstetrics negative OB ROS                             Anesthesia Physical  Anesthesia Plan  ASA: 3  Anesthesia Plan: MAC   Post-op Pain Management:    Induction: Intravenous  PONV Risk Score and Plan: 1 and TIVA, Midazolam and Treatment may vary due to age or medical condition  Airway Management Planned: Nasal Cannula and Natural Airway  Additional Equipment:   Intra-op Plan:   Post-operative Plan:   Informed Consent: I have reviewed the patients History and Physical, chart, labs and discussed the procedure including the risks, benefits and alternatives for the proposed anesthesia with the patient or authorized representative who has indicated his/her understanding and acceptance.     Dental advisory given  Plan Discussed with: CRNA  Anesthesia Plan Comments:         Anesthesia Quick Evaluation

## 2021-09-06 NOTE — Anesthesia Postprocedure Evaluation (Signed)
Anesthesia Post Note  Patient: Alex GILKEY Sr.  Procedure(s) Performed: CATARACT EXTRACTION PHACO AND INTRAOCULAR LENS PLACEMENT ([facility]) LEFT 2.71 00:30.6 (Left: Eye)     Patient location during evaluation: PACU Anesthesia Type: MAC Level of consciousness: awake and alert Pain management: pain level controlled Vital Signs Assessment: post-procedure vital signs reviewed and stable Respiratory status: spontaneous breathing, nonlabored ventilation, respiratory function stable and patient connected to nasal cannula oxygen Cardiovascular status: stable and blood pressure returned to baseline Postop Assessment: no apparent nausea or vomiting Anesthetic complications: no   No notable events documented.  Sheana Bir A  Cecile Guevara

## 2021-09-07 ENCOUNTER — Encounter: Payer: Self-pay | Admitting: Ophthalmology

## 2022-04-11 ENCOUNTER — Other Ambulatory Visit: Payer: Self-pay | Admitting: Neurology

## 2022-04-11 ENCOUNTER — Other Ambulatory Visit (HOSPITAL_COMMUNITY): Payer: Self-pay | Admitting: Neurology

## 2022-04-11 DIAGNOSIS — G3183 Dementia with Lewy bodies: Secondary | ICD-10-CM

## 2022-05-04 ENCOUNTER — Ambulatory Visit (HOSPITAL_COMMUNITY): Payer: Medicare HMO

## 2022-05-20 ENCOUNTER — Ambulatory Visit (HOSPITAL_COMMUNITY)
Admission: RE | Admit: 2022-05-20 | Discharge: 2022-05-20 | Disposition: A | Discharge status: Home / Self Care | Payer: Medicare HMO | Source: Ambulatory Visit | Attending: Neurology | Admitting: Neurology

## 2022-05-20 DIAGNOSIS — G3183 Dementia with Lewy bodies: Secondary | ICD-10-CM | POA: Diagnosis not present

## 2022-05-20 DIAGNOSIS — F02B0 Dementia in other diseases classified elsewhere, moderate, without behavioral disturbance, psychotic disturbance, mood disturbance, and anxiety: Secondary | ICD-10-CM | POA: Diagnosis present

## 2022-12-06 ENCOUNTER — Ambulatory Visit: Payer: Self-pay | Admitting: Surgery

## 2022-12-06 NOTE — H&P (Signed)
Subjective:   CC: No primary diagnosis found.  HPI:  Alex Soto is a 77 y.o. male who was referred by Garth Schlatter* for evaluation of above. Symptoms were first noted 2 weeks ago. Pain is sharp, confined to the left groin, without radiation.  Associated with nothing, exacerbated by constipation.  Lump is reducible?   Past Medical History:  has a past medical history of Blindness of right eye, Diabetes mellitus type 2, uncomplicated (CMS-HCC), DJD (degenerative joint disease), lumbar, Hyperlipidemia, and Hypertension.  Past Surgical History:  Past Surgical History:  Procedure Laterality Date   Limited arthroscopic debridement, arthroscopic subacromial decompression, mini-open rotator cuff repair, and mini-open biceps tenodesis, right shoulder. Right 10/06/2016   Dr. Roland Rack   APPENDECTOMY     TONSILLECTOMY      Family History: family history includes Cancer in his father; Diabetes type II in his mother; High blood pressure (Hypertension) in his father.  Social History:  reports that he quit smoking about 48 years ago. His smoking use included cigarettes. He has been exposed to tobacco smoke. He has never used smokeless tobacco. He reports that he does not drink alcohol and does not use drugs.  Current Medications: has a current medication list which includes the following prescription(s): amoxicillin-clavulanate, aspirin, atorvastatin, cholecalciferol, cyanocobalamin, escitalopram oxalate, ferrous sulfate, glipizide, losartan-hydrochlorothiazide, metformin, ondansetron, pantoprazole, pioglitazone, rivastigmine tartrate, and bacitracin zinc-polymyxin b, and the following Facility-Administered Medications: cyanocobalamin.  Allergies:  Allergies as of 12/06/2022   (No Known Allergies)    ROS:  A 15 point review of systems was performed and pertinent positives and negatives noted in HPI   Objective:     BP (!) 153/61   Pulse 59   Ht 182.5 cm (5' 11.85")   Wt 70.3 kg (155  lb)   BMI 21.11 kg/m   Constitutional :  Alert, cooperative, no distress  Lymphatics/Throat:  Supple, no lymphadenopathy  Respiratory:  clear to auscultation bilaterally  Cardiovascular:  regular rate and rhythm  Gastrointestinal: soft, non-tender; bowel sounds normal; no masses,  no organomegaly. inguinal hernia noted.  moderate, reducible, no overlying skin changes, and LEFT  Musculoskeletal: Steady gait and movement  Skin: Cool and moist, no surgical scars  Psychiatric: Normal affect, non-agitated, not confused       LABS:  N/a   RADS: N/a Assessment:       No primary diagnosis found.  Plan:     1. No primary diagnosis found.   Discussed the risk of surgery including recurrence, which can be up to 50% in the case of incisional or complex hernias, possible use of prosthetic materials (mesh) and the increased risk of mesh infxn if used, bleeding, chronic pain, post-op infxn, post-op SBO or ileus, and possible re-operation to address said risks. The risks of general anesthetic, if used, includes MI, CVA, sudden death or even reaction to anesthetic medications also discussed. Alternatives include continued observation.  Benefits include possible symptom relief, prevention of incarceration, strangulation, enlargement in size over time, and the risk of emergency surgery in the face of strangulation.   Typical post-op recovery time of 3-5 days with 2 weeks of activity restrictions were also discussed.  ED return precautions given for sudden increase in pain, size of hernia with accompanying fever, nausea, and/or vomiting.  The patient verbalized understanding and all questions were answered to the patient's satisfaction.   2. Patient has elected to proceed with surgical treatment. Procedure will be scheduled. left, robotic assisted laparoscopic  Hold aspirin 5 days prior to  date.  labs/images/medications/previous chart entries reviewed personally and relevant changes/updates  noted above.

## 2022-12-16 ENCOUNTER — Emergency Department: Payer: Medicare HMO

## 2022-12-16 ENCOUNTER — Emergency Department
Admission: EM | Admit: 2022-12-16 | Discharge: 2022-12-16 | Disposition: A | Discharge status: Home / Self Care | Payer: Medicare HMO | Attending: Emergency Medicine | Admitting: Emergency Medicine

## 2022-12-16 DIAGNOSIS — F0390 Unspecified dementia without behavioral disturbance: Secondary | ICD-10-CM | POA: Diagnosis not present

## 2022-12-16 DIAGNOSIS — Z1152 Encounter for screening for COVID-19: Secondary | ICD-10-CM | POA: Diagnosis not present

## 2022-12-16 DIAGNOSIS — J101 Influenza due to other identified influenza virus with other respiratory manifestations: Secondary | ICD-10-CM | POA: Diagnosis not present

## 2022-12-16 DIAGNOSIS — R4182 Altered mental status, unspecified: Secondary | ICD-10-CM | POA: Diagnosis present

## 2022-12-16 LAB — COMPREHENSIVE METABOLIC PANEL
ALT: 14 U/L (ref 0–44)
AST: 22 U/L (ref 15–41)
Albumin: 3.8 g/dL (ref 3.5–5.0)
Alkaline Phosphatase: 93 U/L (ref 38–126)
Anion gap: 10 (ref 5–15)
BUN: 18 mg/dL (ref 8–23)
CO2: 26 mmol/L (ref 22–32)
Calcium: 8.7 mg/dL — ABNORMAL LOW (ref 8.9–10.3)
Chloride: 94 mmol/L — ABNORMAL LOW (ref 98–111)
Creatinine, Ser: 1.03 mg/dL (ref 0.61–1.24)
GFR, Estimated: >60 mL/min (ref >60)
Glucose, Bld: 164 mg/dL — ABNORMAL HIGH (ref 70–99)
Potassium: 3.5 mmol/L (ref 3.5–5.1)
Sodium: 130 mmol/L — ABNORMAL LOW (ref 135–145)
Total Bilirubin: 0.7 mg/dL (ref 0.3–1.2)
Total Protein: 7.5 g/dL (ref 6.5–8.1)

## 2022-12-16 LAB — CBC
HCT: 28.9 % — ABNORMAL LOW (ref 39.0–52.0)
Hemoglobin: 9.1 g/dL — ABNORMAL LOW (ref 13.0–17.0)
MCH: 26.5 pg (ref 26.0–34.0)
MCHC: 31.5 g/dL (ref 30.0–36.0)
MCV: 84.3 fL (ref 80.0–100.0)
Platelets: 241 10*3/uL (ref 150–400)
RBC: 3.43 MIL/uL — ABNORMAL LOW (ref 4.22–5.81)
RDW: 16.3 % — ABNORMAL HIGH (ref 11.5–15.5)
WBC: 4.7 10*3/uL (ref 4.0–10.5)
nRBC: 0 % (ref 0.0–0.2)

## 2022-12-16 LAB — URINALYSIS, ROUTINE W REFLEX MICROSCOPIC
Bilirubin Urine: NEGATIVE
Glucose, UA: NEGATIVE mg/dL
Ketones, ur: NEGATIVE mg/dL
Leukocytes,Ua: NEGATIVE
Nitrite: NEGATIVE
Protein, ur: >=300 mg/dL — AB
Specific Gravity, Urine: 1.026 (ref 1.005–1.030)
pH: 5 (ref 5.0–8.0)

## 2022-12-16 LAB — RESP PANEL BY RT-PCR (RSV, FLU A&B, COVID)  RVPGX2
Influenza A by PCR: POSITIVE — AB
Influenza B by PCR: NEGATIVE
Resp Syncytial Virus by PCR: NEGATIVE
SARS Coronavirus 2 by RT PCR: NEGATIVE

## 2022-12-16 MED ORDER — OSELTAMIVIR PHOSPHATE 75 MG PO CAPS: 75.0000 mg | ORAL_CAPSULE | Freq: Two times a day (BID) | ORAL | 0 refills | Status: DC

## 2022-12-16 MED ORDER — LORAZEPAM 1 MG PO TABS: 1.0000 mg | ORAL_TABLET | Freq: Every evening | ORAL | 0 refills | Status: DC | PRN

## 2022-12-16 MED ORDER — LORAZEPAM 1 MG PO TABS: 1.0000 mg | ORAL_TABLET | Freq: Every evening | ORAL | 0 refills | Status: AC | PRN

## 2022-12-16 MED ORDER — OSELTAMIVIR PHOSPHATE 75 MG PO CAPS: 75.0000 mg | ORAL_CAPSULE | Freq: Two times a day (BID) | ORAL | 0 refills | Status: AC

## 2022-12-16 NOTE — ED Notes (Signed)
Pt in wheelchair to sons pov with all belongings and discharge papers. In NAD.

## 2022-12-16 NOTE — ED Triage Notes (Addendum)
Pt wife sts that pt has been more confused as of late. Wife sts that pt has dementia. Pt has not been eating or drinking regularly like he did before. Pt wife sts that pt was dx with bronchitis and has been having a cough. Per wife, pt was seen at [facility] yesterday and was swabbed for Covid, Flu and RSV and it was negative.

## 2022-12-16 NOTE — ED Provider Notes (Signed)
[facility] Provider Note    Event Date/Time   First MD Initiated Contact with Patient 12/16/22 1451     (approximate)   History   Altered Mental Status   HPI  Alex Soto. is a 77 y.o. male with history of dementia who presents with worsening mental status.  Family reports that yesterday the patient was complaining of headache as well as cough, they took him to urgent care where he tested negative for COVID and flu.  Overnight he was quite confused, had loss of control of his bowels which is very atypical for him.  They report he is far more confused than typical.     Physical Exam   Triage Vital Signs: ED Triage Vitals  Enc Vitals Group     BP 12/16/22 1229 (!) 120/52     Pulse Rate 12/16/22 1229 92     Resp 12/16/22 1229 17     Temp 12/16/22 1229 99.4 F (37.4 C)     Temp Source 12/16/22 1229 Oral     SpO2 12/16/22 1229 100 %     Weight 12/16/22 1230 72.6 kg (160 lb)     Height --      Head Circumference --      Peak Flow --      Pain Score 12/16/22 1230 0     Pain Loc --      Pain Edu? --      Excl. in [place]? --     Most recent vital signs: Vitals:   12/16/22 1508 12/16/22 1630  BP:  131/60  Pulse:  68  Resp:  (!) 21  Temp: (!) 100.7 F (38.2 C)   SpO2:  99%     General: Awake, no distress.  CV:  Good peripheral perfusion.  Resp:  Normal effort.  Abd:  No distention.  Soft, nontender ,left groin easily reducible hernia Other:  Diabetic ulcer right second toe, slightly erythematous immediately adjacent but overall well-appearing   ED Results / Procedures / Treatments   Labs (all labs ordered are listed, but only abnormal results are displayed) Labs Reviewed  RESP PANEL BY RT-PCR (RSV, FLU A&B, COVID)  RVPGX2 - Abnormal; Notable for the following components:      Result Value   Influenza A by PCR POSITIVE (*)    All other components within normal limits  COMPREHENSIVE METABOLIC PANEL - Abnormal; Notable for the  following components:   Sodium 130 (*)    Chloride 94 (*)    Glucose, Bld 164 (*)    Calcium 8.7 (*)    All other components within normal limits  CBC - Abnormal; Notable for the following components:   RBC 3.43 (*)    Hemoglobin 9.1 (*)    HCT 28.9 (*)    RDW 16.3 (*)    All other components within normal limits  URINALYSIS, ROUTINE W REFLEX MICROSCOPIC - Abnormal; Notable for the following components:   Color, Urine YELLOW (*)    APPearance HAZY (*)    Hgb urine dipstick SMALL (*)    Protein, ur >=300 (*)    Bacteria, UA RARE (*)    All other components within normal limits     EKG     RADIOLOGY Chest x-ray viewed interpreted by me, no pneumonia    PROCEDURES:  Critical Care performed:   Procedures   MEDICATIONS ORDERED IN ED: Medications - No data to display   IMPRESSION / MDM / ASSESSMENT AND PLAN /  ED COURSE  I reviewed the triage vital signs and the nursing notes. Patient's presentation is most consistent with acute presentation with potential threat to life or bodily function.   Patient presents with altered mental status as detailed above.  Differential includes worsening dementia, infection possibly bacterial versus viral, electrolyte normalities.  Lab work reviewed and is overall reassuring, white blood cell count is normal.  Electrolytes overall reassuring.  Initial temperature of 99.4, this was rechecked and found to be 100.7, will swab for COVID flu, obtain urine  Influenza A is positive, this is likely the cause of his symptoms.  On reevaluation the patient is well-appearing and in no acute distress.  Family is comfortable taking the patient home, will provide short course of Ativan to help patient's sleep at night which has been difficult for the family       FINAL CLINICAL IMPRESSION(S) / ED DIAGNOSES   Final diagnoses:  Influenza A  Dementia without behavioral disturbance, psychotic disturbance, mood disturbance, or anxiety,  unspecified dementia severity, unspecified dementia type ([place])     Rx / DC Orders   ED Discharge Orders          Ordered    LORazepam (ATIVAN) 1 MG tablet  At bedtime PRN        12/16/22 1709    oseltamivir (TAMIFLU) 75 MG capsule  2 times daily        12/16/22 1711             Note:  This document was prepared using Dragon voice recognition software and may include unintentional dictation errors.   Lavonia Drafts, MD 12/16/22 [phone]

## 2022-12-16 NOTE — ED Notes (Signed)
This RN to bedside to answer call bell. Pt was standing at the foot of the bed soaking wet. He had urinated on himself. This RN helped get him cleaned up, new sheets and brief in place.

## 2022-12-27 ENCOUNTER — Inpatient Hospital Stay
Admission: RE | Admit: 2022-12-27 | Discharge: 2022-12-27 | Disposition: A | Discharge status: Home / Self Care | Payer: Medicare HMO | Source: Ambulatory Visit

## 2022-12-27 HISTORY — DX: Neurocognitive disorder with Lewy bodies: G31.83

## 2022-12-27 HISTORY — DX: Pure hypercholesterolemia, unspecified: E78.00

## 2022-12-27 HISTORY — DX: Type 2 diabetes mellitus with foot ulcer: E11.621

## 2022-12-27 HISTORY — DX: Dementia in other diseases classified elsewhere, moderate, without behavioral disturbance, psychotic disturbance, mood disturbance, and anxiety: F02.B0

## 2022-12-27 HISTORY — DX: Type 2 diabetes mellitus with foot ulcer: L97.519

## 2022-12-27 NOTE — Pre-Procedure Instructions (Signed)
Patient and wife reports that patient has an infection in his right toe and will need to have treatment , Dr. Ines Bloomer office made aware, Zigmund Daniel, RN has put surgery on hold.

## 2022-12-27 NOTE — Patient Instructions (Addendum)
Your procedure is scheduled on:01/05/23 - Thursday Report to the Registration Desk on the 1st floor of the [facility]. To find out your arrival time, please call [phone] between 1PM - 3PM on: 01/04/23 - Wednesday If your arrival time is 6:00 am, do not arrive before that time as the [facility] entrance doors do not open until 6:00 am.  REMEMBER: Instructions that are not followed completely may result in serious medical risk, up to and including death; or upon the discretion of your surgeon and anesthesiologist your surgery may need to be rescheduled.  Do not eat food after midnight the night before surgery.  No gum chewing or hard candies.  You may however, drink CLEAR liquids up to 2 hours before you are scheduled to arrive for your surgery. Do not drink anything within 2 hours of your scheduled arrival time.  Clear liquids include: - water    One week prior to surgery: Stop Anti-inflammatories (NSAIDS) such as Advil, Aleve, Ibuprofen, Motrin, Naproxen, Naprosyn and Aspirin based products such as Excedrin, Goody's Powder, BC Powder.  Stop ANY OVER THE COUNTER supplements until after surgery.  You may however, continue to take Tylenol if needed for pain up until the day of surgery.   TAKE ONLY THESE MEDICATIONS THE MORNING OF SURGERY WITH A SIP OF WATER:  atorvastatin (LIPITOR)    HOLD glyBURIDE-metformin (GLUCOVANCE) beginning 01/03/23.   HOLD Aspirin 81 mg beginning 12/30/22.  No Alcohol for 24 hours before or after surgery.  No Smoking including e-cigarettes for 24 hours before surgery.  No chewable tobacco products for at least 6 hours before surgery.  No nicotine patches on the day of surgery.  Do not use any "recreational" drugs for at least a week (preferably 2 weeks) before your surgery.  Please be advised that the combination of cocaine and anesthesia may have negative outcomes, up to and including death. If you test positive for cocaine, your surgery  will be cancelled.  On the morning of surgery brush your teeth with toothpaste and water, you may rinse your mouth with mouthwash if you wish. Do not swallow any toothpaste or mouthwash.  Use CHG Soap or wipes as directed on instruction sheet.  Do not wear jewelry, make-up, hairpins, clips or nail polish.  Do not wear lotions, powders, or perfumes.   Do not shave body hair from the neck down 48 hours before surgery.  Contact lenses, hearing aids and dentures may not be worn into surgery.  Do not bring valuables to the hospital. [facility] is not responsible for any missing/lost belongings or valuables.   Notify your doctor if there is any change in your medical condition (cold, fever, infection).  Wear comfortable clothing (specific to your surgery type) to the hospital.  After surgery, you can help prevent lung complications by doing breathing exercises.  Take deep breaths and cough every 1-2 hours. Your doctor may order a device called an Incentive Spirometer to help you take deep breaths. When coughing or sneezing, hold a pillow firmly against your incision with both hands. This is called "splinting." Doing this helps protect your incision. It also decreases belly discomfort.  If you are being admitted to the hospital overnight, leave your suitcase in the car. After surgery it may be brought to your room.  In case of increased patient census, it may be necessary for you, the patient, to continue your postoperative care in the Same Day Surgery department.  If you are being discharged the day of  surgery, you will not be allowed to drive home. You will need a responsible individual to drive you home and stay with you for 24 hours after surgery.   If you are taking public transportation, you will need to have a responsible individual with you.  Please call the [facility] Dept. at [phone] if you have any questions about these instructions.  Surgery Visitation  Policy:  Patients undergoing a surgery or procedure may have two family members or support persons with them as long as the person is not COVID-19 positive or experiencing its symptoms.   Inpatient Visitation:    Visiting hours are 7 a.m. to 8 p.m. Up to four visitors are allowed at one time in a patient room. The visitors may rotate out with other people during the day. One designated support person (adult) may remain overnight.  Due to an increase in RSV and influenza rates and associated hospitalizations, children ages 51 and under will not be able to visit patients in [facility]. Masks continue to be strongly recommended.    Preparing for Surgery with CHLORHEXIDINE GLUCONATE (CHG) Soap  Chlorhexidine Gluconate (CHG) Soap  o An antiseptic cleaner that kills germs and bonds with the skin to continue killing germs even after washing  o Used for showering the night before surgery and morning of surgery  Before surgery, you can play an important role by reducing the number of germs on your skin.  CHG (Chlorhexidine gluconate) soap is an antiseptic cleanser which kills germs and bonds with the skin to continue killing germs even after washing.  Please do not use if you have an allergy to CHG or antibacterial soaps. If your skin becomes reddened/irritated stop using the CHG.  1. Shower the NIGHT BEFORE SURGERY and the MORNING OF SURGERY with CHG soap.  2. If you choose to wash your hair, wash your hair first as usual with your normal shampoo.  3. After shampooing, rinse your hair and body thoroughly to remove the shampoo.  4. Use CHG as you would any other liquid soap. You can apply CHG directly to the skin and wash gently with a scrungie or a clean washcloth.  5. Apply the CHG soap to your body only from the neck down. Do not use on open wounds or open sores. Avoid contact with your eyes, ears, mouth, and genitals (private parts). Wash face and genitals (private parts)  with your normal soap.  6. Wash thoroughly, paying special attention to the area where your surgery will be performed.  7. Thoroughly rinse your body with warm water.  8. Do not shower/wash with your normal soap after using and rinsing off the CHG soap.  9. Pat yourself dry with a clean towel.  10. Wear clean pajamas to bed the night before surgery.  12. Place clean sheets on your bed the night of your first shower and do not sleep with pets.  13. Shower again with the CHG soap on the day of surgery prior to arriving at the hospital.  14. Do not apply any deodorants/lotions/powders.  15. Please wear clean clothes to the hospital.

## 2023-01-02 ENCOUNTER — Other Ambulatory Visit: Payer: Self-pay | Admitting: Podiatry

## 2023-01-02 ENCOUNTER — Encounter
Admission: RE | Admit: 2023-01-02 | Discharge: 2023-01-02 | Disposition: A | Discharge status: Home / Self Care | Payer: Medicare HMO | Source: Ambulatory Visit | Attending: Podiatry | Admitting: Podiatry

## 2023-01-02 VITALS — Ht 71.85 in | Wt 160.0 lb

## 2023-01-02 DIAGNOSIS — E1122 Type 2 diabetes mellitus with diabetic chronic kidney disease: Secondary | ICD-10-CM

## 2023-01-02 DIAGNOSIS — Z01812 Encounter for preprocedural laboratory examination: Secondary | ICD-10-CM

## 2023-01-02 HISTORY — DX: Type 2 diabetes mellitus with diabetic chronic kidney disease: E11.22

## 2023-01-02 HISTORY — DX: Blindness, one eye, unspecified eye: H54.40

## 2023-01-02 HISTORY — DX: Spondylosis without myelopathy or radiculopathy, lumbar region: M47.816

## 2023-01-02 HISTORY — DX: Essential (primary) hypertension: I10

## 2023-01-02 HISTORY — DX: Hyperlipidemia, unspecified: E78.5

## 2023-01-02 HISTORY — DX: Type 2 diabetes mellitus with diabetic chronic kidney disease: N18.2

## 2023-01-02 HISTORY — DX: Atherosclerosis of aorta: I70.0

## 2023-01-02 NOTE — Patient Instructions (Addendum)
Your procedure is scheduled on: Tuesday, March 5 Report to the Registration Desk on the 1st floor of the [facility]. To find out your arrival time, please call [phone] between 1PM - 3PM on: Monday, March 4 If your arrival time is 6:00 am, do not arrive before that time as the [place] entrance doors do not open until 6:00 am.  REMEMBER: Instructions that are not followed completely may result in serious medical risk, up to and including death; or upon the discretion of your surgeon and anesthesiologist your surgery may need to be rescheduled.  Do not eat food after midnight the night before surgery.  No gum chewing or hard candies.  You may however, drink water up to 2 hours before you are scheduled to arrive for your surgery. Do not drink anything within 2 hours of your scheduled arrival time.  One week prior to surgery: starting today, March 4 Stop aspirin and Anti-inflammatories (NSAIDS) such as Advil, Aleve, Ibuprofen, Motrin, Naproxen, Naprosyn and Aspirin based products such as Excedrin, Goody's Powder, BC Powder. Stop ANY OVER THE COUNTER supplements until after surgery. You may however, continue to take Tylenol if needed for pain up until the day of surgery.  Continue taking all prescribed medications except:  Metformin - do not take today or tomorrow's dose. Resume AFTER surgery.  TAKE ONLY THESE MEDICATIONS THE MORNING OF SURGERY WITH A SIP OF WATER:  Atorvastatin  No Alcohol for 24 hours before or after surgery.  No Smoking including e-cigarettes for 24 hours before surgery.  No chewable tobacco products for at least 6 hours before surgery.  No nicotine patches on the day of surgery.  Do not use any "recreational" drugs for at least a week (preferably 2 weeks) before your surgery.  Please be advised that the combination of cocaine and anesthesia may have negative outcomes, up to and including death. If you test positive for cocaine, your surgery will be  cancelled.  On the morning of surgery brush your teeth with toothpaste and water, you may rinse your mouth with mouthwash if you wish. Do not swallow any toothpaste or mouthwash.  Shower using antibacterial soap before coming to the hospital.  Do not wear jewelry, make-up, hairpins, clips or nail polish.  Do not wear lotions, powders, or perfumes.   Do not shave body hair from the neck down 48 hours before surgery.  Contact lenses, hearing aids and dentures may not be worn into surgery.  Do not bring valuables to the hospital. [facility] is not responsible for any missing/lost belongings or valuables.   Notify your doctor if there is any change in your medical condition (cold, fever, infection).  Wear comfortable clothing (specific to your surgery type) to the hospital.  After surgery, you can help prevent lung complications by doing breathing exercises.  Take deep breaths and cough every 1-2 hours. Your doctor may order a device called an Incentive Spirometer to help you take deep breaths.  If you are being discharged the day of surgery, you will not be allowed to drive home. You will need a responsible individual to drive you home and stay with you for 24 hours after surgery.   If you are taking public transportation, you will need to have a responsible individual with you.  Please call the [facility] Dept. at [phone] if you have any questions about these instructions.  Surgery Visitation Policy:  Patients undergoing a surgery or procedure may have two family members or support persons  with them as long as the person is not COVID-19 positive or experiencing its symptoms.

## 2023-01-03 ENCOUNTER — Ambulatory Visit: Payer: Medicare HMO

## 2023-01-03 ENCOUNTER — Encounter
Admission: RE | Disposition: A | Discharge status: Home / Self Care | Payer: Self-pay | Source: Ambulatory Visit | Attending: Podiatry

## 2023-01-03 ENCOUNTER — Ambulatory Visit: Payer: Medicare HMO | Admitting: Registered Nurse

## 2023-01-03 ENCOUNTER — Ambulatory Visit
Admission: RE | Admit: 2023-01-03 | Discharge: 2023-01-03 | Disposition: A | Discharge status: Home / Self Care | Payer: Medicare HMO | Source: Ambulatory Visit | Attending: Podiatry | Admitting: Podiatry

## 2023-01-03 ENCOUNTER — Other Ambulatory Visit: Payer: Self-pay

## 2023-01-03 ENCOUNTER — Ambulatory Visit: Payer: Medicare HMO | Admitting: Urgent Care

## 2023-01-03 ENCOUNTER — Encounter: Payer: Self-pay | Admitting: Podiatry

## 2023-01-03 DIAGNOSIS — N182 Chronic kidney disease, stage 2 (mild): Secondary | ICD-10-CM | POA: Insufficient documentation

## 2023-01-03 DIAGNOSIS — Z7984 Long term (current) use of oral hypoglycemic drugs: Secondary | ICD-10-CM | POA: Diagnosis not present

## 2023-01-03 DIAGNOSIS — E1122 Type 2 diabetes mellitus with diabetic chronic kidney disease: Secondary | ICD-10-CM

## 2023-01-03 DIAGNOSIS — I129 Hypertensive chronic kidney disease with stage 1 through stage 4 chronic kidney disease, or unspecified chronic kidney disease: Secondary | ICD-10-CM | POA: Insufficient documentation

## 2023-01-03 DIAGNOSIS — M869 Osteomyelitis, unspecified: Secondary | ICD-10-CM | POA: Diagnosis present

## 2023-01-03 DIAGNOSIS — Z87891 Personal history of nicotine dependence: Secondary | ICD-10-CM | POA: Insufficient documentation

## 2023-01-03 DIAGNOSIS — Z09 Encounter for follow-up examination after completed treatment for conditions other than malignant neoplasm: Secondary | ICD-10-CM | POA: Diagnosis not present

## 2023-01-03 DIAGNOSIS — K219 Gastro-esophageal reflux disease without esophagitis: Secondary | ICD-10-CM | POA: Insufficient documentation

## 2023-01-03 DIAGNOSIS — Z01812 Encounter for preprocedural laboratory examination: Secondary | ICD-10-CM

## 2023-01-03 DIAGNOSIS — M899 Disorder of bone, unspecified: Secondary | ICD-10-CM | POA: Insufficient documentation

## 2023-01-03 HISTORY — PX: BONE EXCISION: SHX6730

## 2023-01-03 HISTORY — PX: AMPUTATION TOE: SHX6595

## 2023-01-03 LAB — POCT I-STAT, CHEM 8
BUN: 13 mg/dL (ref 8–23)
Calcium, Ion: 1.17 mmol/L (ref 1.15–1.40)
Chloride: 97 mmol/L — ABNORMAL LOW (ref 98–111)
Creatinine, Ser: 0.7 mg/dL (ref 0.61–1.24)
Glucose, Bld: 157 mg/dL — ABNORMAL HIGH (ref 70–99)
HCT: 26 % — ABNORMAL LOW (ref 39.0–52.0)
Hemoglobin: 8.8 g/dL — ABNORMAL LOW (ref 13.0–17.0)
Potassium: 3.4 mmol/L — ABNORMAL LOW (ref 3.5–5.1)
Sodium: 136 mmol/L (ref 135–145)
TCO2: 26 mmol/L (ref 22–32)

## 2023-01-03 LAB — GLUCOSE, CAPILLARY: Glucose-Capillary: 149 mg/dL — ABNORMAL HIGH (ref 70–99)

## 2023-01-03 SURGERY — BONE EXCISION
Anesthesia: General | Site: Toe | Laterality: Right

## 2023-01-03 MED ORDER — FAMOTIDINE 20 MG PO TABS
ORAL_TABLET | ORAL | Status: AC
  Filled 2023-01-03: qty 1

## 2023-01-03 MED ORDER — LIDOCAINE HCL 1 % IJ SOLN
INTRAMUSCULAR | Status: DC | PRN
  Administered 2023-01-03: 15 mL

## 2023-01-03 MED ORDER — ORAL CARE MOUTH RINSE: 15.0000 mL | Freq: Once | OROMUCOSAL | Status: AC

## 2023-01-03 MED ORDER — CHLORHEXIDINE GLUCONATE 0.12 % MT SOLN
OROMUCOSAL | Status: AC
  Filled 2023-01-03: qty 15

## 2023-01-03 MED ORDER — FENTANYL CITRATE (PF) 100 MCG/2ML IJ SOLN
INTRAMUSCULAR | Status: DC | PRN
  Administered 2023-01-03: 25 ug via INTRAVENOUS

## 2023-01-03 MED ORDER — CEFAZOLIN SODIUM-DEXTROSE 2-4 GM/100ML-% IV SOLN
2.0000 g | INTRAVENOUS | Status: AC
  Administered 2023-01-03: 2 g via INTRAVENOUS

## 2023-01-03 MED ORDER — CHLORHEXIDINE GLUCONATE 0.12 % MT SOLN
15.0000 mL | Freq: Once | OROMUCOSAL | Status: AC
  Administered 2023-01-03: 15 mL via OROMUCOSAL

## 2023-01-03 MED ORDER — PROPOFOL 500 MG/50ML IV EMUL
INTRAVENOUS | Status: DC | PRN
  Administered 2023-01-03: 100 ug/kg/min via INTRAVENOUS

## 2023-01-03 MED ORDER — CEFAZOLIN SODIUM-DEXTROSE 2-4 GM/100ML-% IV SOLN
INTRAVENOUS | Status: AC
  Filled 2023-01-03: qty 100

## 2023-01-03 MED ORDER — LIDOCAINE HCL (PF) 1 % IJ SOLN
INTRAMUSCULAR | Status: AC
  Filled 2023-01-03: qty 30

## 2023-01-03 MED ORDER — MIDAZOLAM HCL 2 MG/2ML IJ SOLN
INTRAMUSCULAR | Status: AC
  Filled 2023-01-03: qty 2

## 2023-01-03 MED ORDER — PROPOFOL 10 MG/ML IV BOLUS
INTRAVENOUS | Status: AC
  Filled 2023-01-03: qty 40

## 2023-01-03 MED ORDER — MIDAZOLAM HCL 2 MG/2ML IJ SOLN
INTRAMUSCULAR | Status: DC | PRN
  Administered 2023-01-03: 1 mg via INTRAVENOUS

## 2023-01-03 MED ORDER — FAMOTIDINE 20 MG PO TABS
20.0000 mg | ORAL_TABLET | Freq: Once | ORAL | Status: AC
  Administered 2023-01-03: 20 mg via ORAL

## 2023-01-03 MED ORDER — PROPOFOL 10 MG/ML IV BOLUS
INTRAVENOUS | Status: DC | PRN
  Administered 2023-01-03: 40 mg via INTRAVENOUS

## 2023-01-03 MED ORDER — SODIUM CHLORIDE 0.9 % IV SOLN: INTRAVENOUS | Status: DC

## 2023-01-03 MED ORDER — BUPIVACAINE HCL (PF) 0.5 % IJ SOLN
INTRAMUSCULAR | Status: AC
  Filled 2023-01-03: qty 30

## 2023-01-03 MED ORDER — FENTANYL CITRATE (PF) 100 MCG/2ML IJ SOLN
INTRAMUSCULAR | Status: AC
  Filled 2023-01-03: qty 2

## 2023-01-03 SURGICAL SUPPLY — 49 items
BLADE OSC/SAGITTAL MD 5.5X18 (BLADE) ×2 IMPLANT
BLADE SURG MINI STRL (BLADE) ×2 IMPLANT
BNDG CMPR 75X21 PLY HI ABS (MISCELLANEOUS) ×2
BNDG CMPR STD VLCR NS LF 5.8X4 (GAUZE/BANDAGES/DRESSINGS) ×2
BNDG ELASTIC 4X5.8 VLCR NS LF (GAUZE/BANDAGES/DRESSINGS) ×2 IMPLANT
BNDG ESMARCH 4 X 12 STRL LF (GAUZE/BANDAGES/DRESSINGS) ×2
BNDG ESMARCH 4X12 STRL LF (GAUZE/BANDAGES/DRESSINGS) ×2 IMPLANT
BNDG GAUZE DERMACEA FLUFF 4 (GAUZE/BANDAGES/DRESSINGS) ×2 IMPLANT
BNDG GZE 12X3 1 PLY HI ABS (GAUZE/BANDAGES/DRESSINGS) ×4
BNDG GZE DERMACEA 4 6PLY (GAUZE/BANDAGES/DRESSINGS) ×2
BNDG STRETCH GAUZE 3IN X12FT (GAUZE/BANDAGES/DRESSINGS) ×4 IMPLANT
CUFF TOURN SGL QUICK 12 (TOURNIQUET CUFF) IMPLANT
CUFF TOURN SGL QUICK 18X4 (TOURNIQUET CUFF) IMPLANT
DRAPE FLUOR MINI C-ARM 54X84 (DRAPES) ×2 IMPLANT
DRAPE XRAY CASSETTE 23X24 (DRAPES) ×2 IMPLANT
DURAPREP 26ML APPLICATOR (WOUND CARE) ×2 IMPLANT
ELECT REM PT RETURN 9FT ADLT (ELECTROSURGICAL) ×2 IMPLANT
ELECTRODE REM PT RTRN 9FT ADLT (ELECTROSURGICAL) ×2 IMPLANT
GAUZE PACKING IODOFORM 1/2INX (GAUZE/BANDAGES/DRESSINGS) ×2 IMPLANT
GAUZE SPONGE 4X4 12PLY STRL (GAUZE/BANDAGES/DRESSINGS) ×2 IMPLANT
GAUZE STRETCH 2X75IN STRL (MISCELLANEOUS) ×2 IMPLANT
GAUZE XEROFORM 1X8 LF (GAUZE/BANDAGES/DRESSINGS) ×2 IMPLANT
GLOVE BIO SURGEON STRL SZ7.5 (GLOVE) ×2 IMPLANT
GLOVE SURG UNDER LTX SZ8 (GLOVE) ×2 IMPLANT
GOWN STRL REUS W/ TWL XL LVL3 (GOWN DISPOSABLE) ×4 IMPLANT
GOWN STRL REUS W/TWL XL LVL3 (GOWN DISPOSABLE) ×4
IV NS IRRIG 3000ML ARTHROMATIC (IV SOLUTION) ×2 IMPLANT
KIT TURNOVER KIT A (KITS) ×2 IMPLANT
LABEL OR SOLS (LABEL) ×2 IMPLANT
MANIFOLD NEPTUNE II (INSTRUMENTS) ×2 IMPLANT
NDL FILTER BLUNT 18X1 1/2 (NEEDLE) ×2 IMPLANT
NDL HYPO 25X1 1.5 SAFETY (NEEDLE) ×2 IMPLANT
NEEDLE FILTER BLUNT 18X1 1/2 (NEEDLE) ×2 IMPLANT
NEEDLE HYPO 25X1 1.5 SAFETY (NEEDLE) ×2 IMPLANT
NS IRRIG 500ML POUR BTL (IV SOLUTION) ×2 IMPLANT
PACK EXTREMITY ARMC (MISCELLANEOUS) ×2 IMPLANT
PAD ABD DERMACEA PRESS 5X9 (GAUZE/BANDAGES/DRESSINGS) ×4 IMPLANT
PULSAVAC PLUS IRRIG FAN TIP (DISPOSABLE) IMPLANT
SHIELD FULL FACE ANTIFOG 7M (MISCELLANEOUS) ×2 IMPLANT
STOCKINETTE M/LG 89821 (MISCELLANEOUS) ×2 IMPLANT
STRAP SAFETY 5IN WIDE (MISCELLANEOUS) ×2 IMPLANT
SUT ETHILON 3-0 FS-10 30 BLK (SUTURE) ×4 IMPLANT
SUT ETHILON 5-0 FS-2 18 BLK (SUTURE) ×2 IMPLANT
SUT VIC AB 4-0 FS2 27 (SUTURE) ×2 IMPLANT
SUTURE EHLN 3-0 FS-10 30 BLK (SUTURE) ×2 IMPLANT
SYR 10ML LL (SYRINGE) ×6 IMPLANT
TIP FAN IRRIG PULSAVAC PLUS (DISPOSABLE) ×2 IMPLANT
TRAP FLUID SMOKE EVACUATOR (MISCELLANEOUS) ×2 IMPLANT
WATER STERILE IRR 500ML POUR (IV SOLUTION) ×2 IMPLANT

## 2023-01-03 NOTE — Discharge Instructions (Addendum)
[facility]  POST OPERATIVE INSTRUCTIONS FOR DR. Vickki Muff AND DR. Hitchcock   Take your medication as prescribed.  Pain medication should be taken only as needed.  Keep the dressing clean, dry and intact.  Keep your foot elevated above the heart level for the first 48 hours.  Walking to the bathroom and brief periods of walking are acceptable, unless we have instructed you to be non-weight bearing.  Always wear your post-op shoe when walking.  Always use your crutches if you are to be non-weight bearing.  Do not take a shower. Baths are permissible as long as the foot is kept out of the water.   Every hour you are awake:  Bend your knee 15 times. Flex foot 15 times Massage calf 15 times  Call [facility] [phone]) if any of the following problems occur: You develop a temperature or fever. The bandage becomes saturated with blood. Medication does not stop your pain. Injury of the foot occurs. Any symptoms of infection including redness, odor, or red streaks running from wound.  AMBULATORY SURGERY  DISCHARGE INSTRUCTIONS   The drugs that you were given will stay in your system until tomorrow so for the next 24 hours you should not:  Drive an automobile Make any legal decisions Drink any alcoholic beverage   You may resume regular meals tomorrow.  Today it is better to start with liquids and gradually work up to solid foods.  You may eat anything you prefer, but it is better to start with liquids, then soup and crackers, and gradually work up to solid foods.   Please notify your doctor immediately if you have any unusual bleeding, trouble breathing, redness and pain at the surgery site, drainage, fever, or pain not relieved by medication.    Additional Instructions:        Please contact your physician with any problems or Same Day Surgery at [phone], Monday through Friday 6  am to 4 pm, or [place] at [facility] number at [phone].

## 2023-01-03 NOTE — H&P (Signed)
HISTORY AND PHYSICAL INTERVAL NOTE:  01/03/2023  11:59 AM  Alex Gins Sr.  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  The various methods of treatment have been discussed with the patient.  No guarantees were given.  After consideration of risks, benefits and other options for treatment, the patient has consented to surgery.  I have reviewed the patients' chart and labs.     A history and physical examination was performed in my office.  The patient was reexamined.  There have been no changes to this history and physical examination.  Samara Deist A

## 2023-01-03 NOTE — Anesthesia Preprocedure Evaluation (Signed)
Anesthesia Evaluation  Patient identified by MRN, date of birth, ID band Patient awake    Reviewed: Allergy & Precautions, NPO status , Patient's Chart, lab work & pertinent test results  Airway Mallampati: III  TM Distance: >3 FB Neck ROM: full    Dental  (+) Edentulous Upper, Edentulous Lower   Pulmonary neg pulmonary ROS, former smoker   Pulmonary exam normal        Cardiovascular hypertension, negative cardio ROS Normal cardiovascular exam     Neuro/Psych  PSYCHIATRIC DISORDERS     Dementia negative neurological ROS  negative psych ROS   GI/Hepatic negative GI ROS, Neg liver ROS,GERD  Controlled,,  Endo/Other  negative endocrine ROSdiabetes    Renal/GU Renal diseasenegative Renal ROS  negative genitourinary   Musculoskeletal  (+) Arthritis ,    Abdominal   Peds  Hematology negative hematology ROS (+) Blood dyscrasia, anemia   Anesthesia Other Findings Past Medical History: No date: Anemia No date: Atherosclerosis of abdominal aorta (HCC) 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  Only wears upper  Past Surgical History: 03/01/2020: AMPUTATION TOE; Right     Comment:  Procedure: AMPUTATION TOE;  Surgeon: Samara Deist,               DPM;  Location: [facility];  Service: Podiatry;                Laterality: Right; No date: APPENDECTOMY 09/06/2021:  CATARACT EXTRACTION W/PHACO; Left     Comment:  Procedure: CATARACT EXTRACTION PHACO AND INTRAOCULAR               LENS PLACEMENT (IOC) LEFT 2.71 00:30.6;  Surgeon: Eulogio Bear, MD;  Location: [place];                Service: Ophthalmology;  Laterality: Left;  Diabetic No date: EYE SURGERY     Comment:  age 5 No date: KIDNEY STONE SURGERY 04/19/2021: REMOVAL RETAINED LENS; Right     Comment:  Procedure: CATARACT EXTRACTION PHACO,  RIGHT VISION BLUE              0.20 00:01.4 case aborted;  Surgeon: Eulogio Bear,               MD;  Location: [place];  Service:               Ophthalmology;  Laterality: Right;  Diabetic - oral meds 10/06/2016: SHOULDER ARTHROSCOPY WITH BICEPS TENDON REPAIR; Right     Comment:  Procedure: SHOULDER ARTHROSCOPY WITH BICEPS TENDON               REPAIR;  Surgeon: Corky Mull, MD;  Location: [facility];               Service: Orthopedics;  Laterality: Right; 10/06/2016: SHOULDER ARTHROSCOPY WITH OPEN ROTATOR CUFF  REPAIR; Right     Comment:  Procedure: SHOULDER ARTHROSCOPY WITH OPEN ROTATOR CUFF               REPAIR;  Surgeon: Corky Mull, MD;  Location: [facility];               Service: Orthopedics;  Laterality: Right; 10/06/2016: SHOULDER ARTHROSCOPY WITH SUBACROMIAL DECOMPRESSION; Right     Comment:  Procedure: SHOULDER ARTHROSCOPY WITH SUBACROMIAL               DECOMPRESSION AND DEBRIDEMENT;  Surgeon: Corky Mull,               MD;  Location: [facility];  Service: Orthopedics;                Laterality: Right; No date: TONSILLECTOMY  BMI    Body Mass Index: 20.56 kg/m      Reproductive/Obstetrics negative OB ROS                             Anesthesia Physical Anesthesia Plan  ASA: 3  Anesthesia Plan: General   Post-op Pain Management:    Induction: Intravenous  PONV Risk Score and Plan: Propofol infusion and TIVA  Airway Management Planned: Natural Airway and Nasal  Cannula  Additional Equipment:   Intra-op Plan:   Post-operative Plan:   Informed Consent: I have reviewed the patients History and Physical, chart, labs and discussed the procedure including the risks, benefits and alternatives for the proposed anesthesia with the patient or authorized representative who has indicated his/her understanding and acceptance.     Dental Advisory Given  Plan Discussed with: Anesthesiologist, CRNA and Surgeon  Anesthesia Plan Comments: (Patient consented for risks of anesthesia including but not limited to:  - adverse reactions to medications - risk of airway placement if required - damage to eyes, teeth, lips or other oral mucosa - nerve damage due to positioning  - sore throat or hoarseness - Damage to heart, brain, nerves, lungs, other parts of body or loss of life  Patient voiced understanding.)       Anesthesia Quick Evaluation

## 2023-01-03 NOTE — Op Note (Signed)
Operative note   Surgeon:Braeson Rupe Lawyer: None    Preop diagnosis: 1.  Osteomyelitis right second toe 2.  Exostosis interphalangeal joint medial right great toe    Postop diagnosis: Same    Procedure:1.  Amputation right second toe 2.  Exostectomy interphalangeal joint right great toe 3.  Intraoperative fluoroscopy without assistance of radiologist    EBL: Minimal    Anesthesia:local and IV sedation    Hemostasis: Ankle tourniquet inflated to 200 mmHg for approximately 20 minutes    Specimen: Amputated right second toe for pathology and culture right second toe joint    Complications: None    Operative indications:Alex Soto. is an 77 y.o. that presents today for surgical intervention.  The risks/benefits/alternatives/complications have been discussed and consent has been given.    Procedure:  Patient was brought into the OR and placed on the operating table in thesupine position. After anesthesia was obtained theright lower extremity was prepped and draped in usual sterile fashion.  Attention was initially directed to the medial aspect of the interphalangeal joint of the right great toe where a dorsal medial incision was performed.  Full-thickness flaps were created down to the interphalangeal joint region at the distal phalanx.  At this time just deep to the preulcerative hyperkeratotic lesion there was noted to be a bony prominence.  The power saw was able to remove this prominence from the surgical field in toto.  No signs of deep infection were noted.  The wound was then flushed with copious amounts of irrigation.  Intraoperative fluoroscopy was used both pre and post removal of the spur.  Closure was performed with a 4-0 Vicryl for the deeper tissue and a 3-0 nylon for the skin.  Attention was directed to the right second toe where 2 full-thickness flaps were created at the base of the second toe.  The toe was disarticulated at the metatarsophalangeal joint.  The  wound was flushed with copious amounts of irrigation.  Closure was performed with a 3-0 nylon for the skin.  The toe was sent for pathological examination.  Post excision of the toe a deep wound culture of swab was performed for any residual infection in the area.    Patient tolerated the procedure and anesthesia well.  Was transported from the OR to the PACU with all vital signs stable and vascular status intact. To be discharged per routine protocol.  Will follow up in approximately 1 week in the outpatient clinic.

## 2023-01-03 NOTE — Transfer of Care (Signed)
Immediate Anesthesia Transfer of Care Note  Patient: Alex HERRIDGE Sr.  Procedure(s) Performed: 2ND TOE PARTIAL BONE EXCISION (Right) AMPUTATION TOE (Right: Toe)  Patient Location: PACU  Anesthesia Type:General  Level of Consciousness: drowsy  Airway & Oxygen Therapy: Patient Spontanous Breathing and Patient connected to face mask oxygen  Post-op Assessment: Report given to RN and Post -op Vital signs reviewed and stable  Post vital signs: stable  Last Vitals:  Vitals Value Taken Time  BP 104/58 01/03/23 1322  Temp    Pulse 47 01/03/23 1325  Resp 18 01/03/23 1325  SpO2 100 % 01/03/23 1325  Vitals shown include unvalidated device data.  Last Pain:  Vitals:   01/03/23 1123  TempSrc: Temporal  PainSc: 0-No pain         Complications: No notable events documented.

## 2023-01-04 ENCOUNTER — Encounter: Payer: Self-pay | Admitting: Podiatry

## 2023-01-05 ENCOUNTER — Ambulatory Visit: Admit: 2023-01-05 | Payer: Medicare HMO | Admitting: Surgery

## 2023-01-05 LAB — SURGICAL PATHOLOGY

## 2023-01-05 SURGERY — HERNIORRHAPHY, INGUINAL, ROBOT-ASSISTED, LAPAROSCOPIC
Anesthesia: General | Site: Groin | Laterality: Left

## 2023-01-05 NOTE — Anesthesia Postprocedure Evaluation (Signed)
Anesthesia Post Note  Patient: Alex Soto.  Procedure(s) Performed: Exostectomy interphalangeal joint right great toe (Right) AMPUTATION TOE, 2nd right (Right: Toe)  Patient location during evaluation: PACU Anesthesia Type: General Level of consciousness: awake and alert Pain management: pain level controlled Vital Signs Assessment: post-procedure vital signs reviewed and stable Respiratory status: spontaneous breathing, nonlabored ventilation and respiratory function stable Cardiovascular status: blood pressure returned to baseline and stable Postop Assessment: no apparent nausea or vomiting Anesthetic complications: no   No notable events documented.   Last Vitals:  Vitals:   01/03/23 1345 01/03/23 1418  BP: (!) 147/89 137/61  Pulse:  61  Resp:  18  Temp:  36.6 C  SpO2:  100%    Last Pain:  Vitals:   01/04/23 0840  TempSrc:   PainSc: 0-No pain                 Alphonsus Sias

## 2023-01-08 LAB — AEROBIC/ANAEROBIC CULTURE W GRAM STAIN (SURGICAL/DEEP WOUND)
Culture: NO GROWTH
Gram Stain: NONE SEEN

## 2023-06-06 ENCOUNTER — Ambulatory Visit: Payer: Self-pay | Admitting: Surgery

## 2023-06-06 NOTE — H&P (View-Only) (Signed)
 Subjective:   CC: Non-recurrent unilateral inguinal hernia without obstruction or gangrene [K40.90]  HPI:  Alex Soto is a 77 y.o. male who returns for above.  No change in size, but worries about increasing nausea. Pain increased over hernia with nausea, decreases after reduction.  Past Medical History:  has a past medical history of Blindness of right eye, Diabetes mellitus type 2, uncomplicated (CMS/HHS-HCC), DJD (degenerative joint disease), lumbar, Hyperlipidemia, and Hypertension.  Past Surgical History:  Past Surgical History:  Procedure Laterality Date   Limited arthroscopic debridement, arthroscopic subacromial decompression, mini-open rotator cuff repair, and mini-open biceps tenodesis, right shoulder. Right 10/06/2016   Dr. Joice Lofts   APPENDECTOMY     TONSILLECTOMY      Family History: family history includes Cancer in his father; Diabetes type II in his mother; High blood pressure (Hypertension) in his father.  Social History:  reports that he quit smoking about 49 years ago. His smoking use included cigarettes. He has been exposed to tobacco smoke. He has never used smokeless tobacco. He reports that he does not drink alcohol and does not use drugs.  Current Medications: has a current medication list which includes the following prescription(s): aspirin, atorvastatin, cholecalciferol, cyanocobalamin, escitalopram oxalate, ferrous sulfate, glipizide, losartan-hydrochlorothiazide, metformin, rivastigmine tartrate, bacitracin zinc-polymyxin b, and pioglitazone, and the following Facility-Administered Medications: cyanocobalamin.  Allergies:  Allergies as of 06/06/2023   (No Known Allergies)    ROS:  A 15 point review of systems was performed and pertinent positives and negatives noted in HPI   Objective:     BP (!) 140/58   Pulse 64   Ht 182.5 cm (5' 11.85")   Wt 78.9 kg (173 lb 15.1 oz)   BMI 23.69 kg/m   Constitutional :  Alert, cooperative, no distress   Lymphatics/Throat:  Supple, no lymphadenopathy  Respiratory:  clear to auscultation bilaterally  Cardiovascular:  regular rate and rhythm  Gastrointestinal: soft, non-tender; bowel sounds normal; no masses,  no organomegaly. inguinal hernia noted.  moderate, reducible, no overlying skin changes, and LEFT  Musculoskeletal: Steady gait and movement  Skin: Cool and moist  Psychiatric: Normal affect, non-agitated, not confused       LABS:  N/a   RADS: N/a Assessment:       Non-recurrent unilateral inguinal hernia without obstruction or gangrene [K40.90]  Plan:     1. Non-recurrent unilateral inguinal hernia without obstruction or gangrene [K40.90]   Discussed the risk of surgery including recurrence, which can be up to 50% in the case of incisional or complex hernias, possible use of prosthetic materials (mesh) and the increased risk of mesh infxn if used, bleeding, chronic pain, post-op infxn, post-op SBO or ileus, and possible re-operation to address said risks. The risks of general anesthetic, if used, includes MI, CVA, sudden death or even reaction to anesthetic medications also discussed. Alternatives include continued observation.  Benefits include possible symptom relief, prevention of incarceration, strangulation, enlargement in size over time, and the risk of emergency surgery in the face of strangulation.   Typical post-op recovery time of 3-5 days with 2 weeks of activity restrictions were also discussed.  ED return precautions given for sudden increase in pain, size of hernia with accompanying fever, nausea, and/or vomiting.  The patient verbalized understanding and all questions were answered to the patient's satisfaction.   2. Patient has elected to proceed with surgical treatment. Procedure will be scheduled. left, robotic assisted laparoscopic  Hold aspirin 5 days prior  labs/images/medications/previous chart entries reviewed personally and  relevant changes/updates  noted above.

## 2023-06-06 NOTE — H&P (Signed)
Subjective:   CC: Non-recurrent unilateral inguinal hernia without obstruction or gangrene [K40.90]  HPI:  ITSUO PACIFICO is a 77 y.o. male who returns for above.  No change in size, but worries about increasing nausea. Pain increased over hernia with nausea, decreases after reduction.  Past Medical History:  has a past medical history of Blindness of right eye, Diabetes mellitus type 2, uncomplicated (CMS/HHS-HCC), DJD (degenerative joint disease), lumbar, Hyperlipidemia, and Hypertension.  Past Surgical History:  Past Surgical History:  Procedure Laterality Date   Limited arthroscopic debridement, arthroscopic subacromial decompression, mini-open rotator cuff repair, and mini-open biceps tenodesis, right shoulder. Right 10/06/2016   Dr. Joice Lofts   APPENDECTOMY     TONSILLECTOMY      Family History: family history includes Cancer in his father; Diabetes type II in his mother; High blood pressure (Hypertension) in his father.  Social History:  reports that he quit smoking about 49 years ago. His smoking use included cigarettes. He has been exposed to tobacco smoke. He has never used smokeless tobacco. He reports that he does not drink alcohol and does not use drugs.  Current Medications: has a current medication list which includes the following prescription(s): aspirin, atorvastatin, cholecalciferol, cyanocobalamin, escitalopram oxalate, ferrous sulfate, glipizide, losartan-hydrochlorothiazide, metformin, rivastigmine tartrate, bacitracin zinc-polymyxin b, and pioglitazone, and the following Facility-Administered Medications: cyanocobalamin.  Allergies:  Allergies as of 06/06/2023   (No Known Allergies)    ROS:  A 15 point review of systems was performed and pertinent positives and negatives noted in HPI   Objective:     BP (!) 140/58   Pulse 64   Ht 182.5 cm (5' 11.85")   Wt 78.9 kg (173 lb 15.1 oz)   BMI 23.69 kg/m   Constitutional :  Alert, cooperative, no distress   Lymphatics/Throat:  Supple, no lymphadenopathy  Respiratory:  clear to auscultation bilaterally  Cardiovascular:  regular rate and rhythm  Gastrointestinal: soft, non-tender; bowel sounds normal; no masses,  no organomegaly. inguinal hernia noted.  moderate, reducible, no overlying skin changes, and LEFT  Musculoskeletal: Steady gait and movement  Skin: Cool and moist  Psychiatric: Normal affect, non-agitated, not confused       LABS:  N/a   RADS: N/a Assessment:       Non-recurrent unilateral inguinal hernia without obstruction or gangrene [K40.90]  Plan:     1. Non-recurrent unilateral inguinal hernia without obstruction or gangrene [K40.90]   Discussed the risk of surgery including recurrence, which can be up to 50% in the case of incisional or complex hernias, possible use of prosthetic materials (mesh) and the increased risk of mesh infxn if used, bleeding, chronic pain, post-op infxn, post-op SBO or ileus, and possible re-operation to address said risks. The risks of general anesthetic, if used, includes MI, CVA, sudden death or even reaction to anesthetic medications also discussed. Alternatives include continued observation.  Benefits include possible symptom relief, prevention of incarceration, strangulation, enlargement in size over time, and the risk of emergency surgery in the face of strangulation.   Typical post-op recovery time of 3-5 days with 2 weeks of activity restrictions were also discussed.  ED return precautions given for sudden increase in pain, size of hernia with accompanying fever, nausea, and/or vomiting.  The patient verbalized understanding and all questions were answered to the patient's satisfaction.   2. Patient has elected to proceed with surgical treatment. Procedure will be scheduled. left, robotic assisted laparoscopic  Hold aspirin 5 days prior  labs/images/medications/previous chart entries reviewed personally and  relevant changes/updates  noted above.

## 2023-06-15 ENCOUNTER — Encounter
Admission: RE | Admit: 2023-06-15 | Discharge: 2023-06-15 | Disposition: A | Discharge status: Home / Self Care | Payer: Medicare HMO | Source: Ambulatory Visit | Attending: Surgery | Admitting: Surgery

## 2023-06-15 ENCOUNTER — Other Ambulatory Visit: Payer: Self-pay

## 2023-06-15 ENCOUNTER — Encounter: Payer: Self-pay | Admitting: Urgent Care

## 2023-06-15 VITALS — Ht 73.0 in | Wt 173.0 lb

## 2023-06-15 DIAGNOSIS — Z01810 Encounter for preprocedural cardiovascular examination: Secondary | ICD-10-CM | POA: Diagnosis not present

## 2023-06-15 DIAGNOSIS — E785 Hyperlipidemia, unspecified: Secondary | ICD-10-CM | POA: Insufficient documentation

## 2023-06-15 DIAGNOSIS — D631 Anemia in chronic kidney disease: Secondary | ICD-10-CM | POA: Diagnosis not present

## 2023-06-15 DIAGNOSIS — I700 Atherosclerosis of aorta: Secondary | ICD-10-CM | POA: Insufficient documentation

## 2023-06-15 DIAGNOSIS — Z01812 Encounter for preprocedural laboratory examination: Secondary | ICD-10-CM | POA: Diagnosis not present

## 2023-06-15 DIAGNOSIS — E1122 Type 2 diabetes mellitus with diabetic chronic kidney disease: Secondary | ICD-10-CM

## 2023-06-15 DIAGNOSIS — I10 Essential (primary) hypertension: Secondary | ICD-10-CM

## 2023-06-15 DIAGNOSIS — N182 Chronic kidney disease, stage 2 (mild): Secondary | ICD-10-CM | POA: Insufficient documentation

## 2023-06-15 DIAGNOSIS — I129 Hypertensive chronic kidney disease with stage 1 through stage 4 chronic kidney disease, or unspecified chronic kidney disease: Secondary | ICD-10-CM | POA: Insufficient documentation

## 2023-06-15 DIAGNOSIS — D649 Anemia, unspecified: Secondary | ICD-10-CM

## 2023-06-15 DIAGNOSIS — Z01818 Encounter for other preprocedural examination: Secondary | ICD-10-CM | POA: Diagnosis present

## 2023-06-15 LAB — CBC
HCT: 30 % — ABNORMAL LOW (ref 39.0–52.0)
Hemoglobin: 9.6 g/dL — ABNORMAL LOW (ref 13.0–17.0)
MCH: 27.3 pg (ref 26.0–34.0)
MCHC: 32 g/dL (ref 30.0–36.0)
MCV: 85.2 fL (ref 80.0–100.0)
Platelets: 304 10*3/uL (ref 150–400)
RBC: 3.52 MIL/uL — ABNORMAL LOW (ref 4.22–5.81)
RDW: 16.6 % — ABNORMAL HIGH (ref 11.5–15.5)
WBC: 5.2 10*3/uL (ref 4.0–10.5)
nRBC: 0 % (ref 0.0–0.2)

## 2023-06-15 LAB — BASIC METABOLIC PANEL
Anion gap: 10 (ref 5–15)
BUN: 22 mg/dL (ref 8–23)
CO2: 27 mmol/L (ref 22–32)
Calcium: 9.2 mg/dL (ref 8.9–10.3)
Chloride: 99 mmol/L (ref 98–111)
Creatinine, Ser: 0.96 mg/dL (ref 0.61–1.24)
GFR, Estimated: >60 mL/min (ref >60)
Glucose, Bld: 131 mg/dL — ABNORMAL HIGH (ref 70–99)
Potassium: 3.2 mmol/L — ABNORMAL LOW (ref 3.5–5.1)
Sodium: 136 mmol/L (ref 135–145)

## 2023-06-15 NOTE — Patient Instructions (Addendum)
Your procedure is scheduled on: Friday 06/23/23 To find out your arrival time, please call [phone] between 1PM - 3PM on: Thursday 06/22/23   Report to the Registration Desk on the 1st floor of the [facility]. FREE Valet parking is available.  If your arrival time is 6:00 am, do not arrive before that time as the [facility] entrance doors do not open until 6:00 am.  REMEMBER: Instructions that are not followed completely may result in serious medical risk, up to and including death; or upon the discretion of your surgeon and anesthesiologist your surgery may need to be rescheduled.  Do not eat food after midnight the night before surgery.  No gum chewing or hard candies.  You may however, drink CLEAR liquids up to 2 hours before you are scheduled to arrive for your surgery. Do not drink anything within 2 hours of your scheduled arrival time.  Type 1 and Type 2 diabetics should only drink water.  One week prior to surgery: Stop Anti-inflammatories (NSAIDS) such as Advil, Aleve, Ibuprofen, Motrin, Naproxen, Naprosyn and Aspirin based products such as Excedrin, Goody's Powder, BC Powder. You may however, continue to take Tylenol if needed for pain up until the day of surgery.  Stop ANY OVER THE COUNTER supplements and vitamins until after surgery. Continue your Iron  Continue taking all prescribed medications with the exception of the following:  Metformin hold for 2 days, last dose Tuesday 06/20/23.  Aspirin hold for 5 days, last dose Saturday 06/17/23  **Follow guidelines for insulin and diabetes medications**  TAKE ONLY THESE MEDICATIONS THE MORNING OF SURGERY WITH A SIP OF WATER:  atorvastatin (LIPITOR) 40 MG tablet  rivastigmine (EXELON) 3 MG capsule   No Alcohol for 24 hours before or after surgery.  No Smoking including e-cigarettes for 24 hours before surgery.  No chewable tobacco products for at least 6 hours before surgery.  No nicotine patches on the day of  surgery.  Do not use any "recreational" drugs for at least a week (preferably 2 weeks) before your surgery.  Please be advised that the combination of cocaine and anesthesia may have negative outcomes, up to and including death. If you test positive for cocaine, your surgery will be cancelled.  On the morning of surgery brush your teeth with toothpaste and water, you may rinse your mouth with mouthwash if you wish. Do not swallow any toothpaste or mouthwash.  Use CHG Soap or wipes as directed on instruction sheet below.  Do not wear lotions, powders, or perfumes.   Do not shave body hair from the neck down 48 hours before surgery.  Wear comfortable clothing (specific to your surgery type) to the hospital.  Do not wear jewelry, make-up, hairpins, clips or nail polish.  Contact lenses, hearing aids and dentures may not be worn into surgery.  Do not bring valuables to the hospital. [facility] is not responsible for any missing/lost belongings or valuables.   Notify your doctor if there is any change in your medical condition (cold, fever, infection).  If you are being discharged the day of surgery, you will not be allowed to drive home. You will need a responsible individual to drive you home and stay with you for 24 hours after surgery.   If you are taking public transportation, you will need to have a responsible individual with you.  If you are being admitted to the hospital overnight, leave your suitcase in the car. After surgery it may be brought to your  room.  In case of increased patient census, it may be necessary for you, the patient, to continue your postoperative care in the Same Day Surgery department.  After surgery, you can help prevent lung complications by doing breathing exercises.  Take deep breaths and cough every 1-2 hours. Your doctor may order a device called an Incentive Spirometer to help you take deep breaths. When coughing or sneezing, hold a pillow firmly  against your incision with both hands. This is called "splinting." Doing this helps protect your incision. It also decreases belly discomfort.  Surgery Visitation Policy:  Patients undergoing a surgery or procedure may have two family members or support persons with them as long as the person is not COVID-19 positive or experiencing its symptoms.   Inpatient Visitation:    Visiting hours are 7 a.m. to 8 p.m. Up to four visitors are allowed at one time in a patient room. The visitors may rotate out with other people during the day. One designated support person (adult) may remain overnight.  Please call the Pre-admissions Testing Dept. at [phone] if you have any questions about these instructions.     Preparing for Surgery with CHLORHEXIDINE GLUCONATE (CHG) Soap  Chlorhexidine Gluconate (CHG) Soap  o An antiseptic cleaner that kills germs and bonds with the skin to continue killing germs even after washing  o Used for showering the night before surgery and morning of surgery  Before surgery, you can play an important role by reducing the number of germs on your skin.  CHG (Chlorhexidine gluconate) soap is an antiseptic cleanser which kills germs and bonds with the skin to continue killing germs even after washing.  Please do not use if you have an allergy to CHG or antibacterial soaps. If your skin becomes reddened/irritated stop using the CHG.  1. Shower the NIGHT BEFORE SURGERY and the MORNING OF SURGERY with CHG soap.  2. If you choose to wash your hair, wash your hair first as usual with your normal shampoo.  3. After shampooing, rinse your hair and body thoroughly to remove the shampoo.  4. Use CHG as you would any other liquid soap. You can apply CHG directly to the skin and wash gently with a scrungie or a clean washcloth.  5. Apply the CHG soap to your body only from the neck down. Do not use on open wounds or open sores. Avoid contact with your eyes, ears, mouth,  and genitals (private parts). Wash face and genitals (private parts) with your normal soap.  6. Wash thoroughly, paying special attention to the area where your surgery will be performed.  7. Thoroughly rinse your body with warm water.  8. Do not shower/wash with your normal soap after using and rinsing off the CHG soap.  9. Pat yourself dry with a clean towel.  10. Wear clean pajamas to bed the night before surgery.  12. Place clean sheets on your bed the night of your first shower and do not sleep with pets.  13. Shower again with the CHG soap on the day of surgery prior to arriving at the hospital.  14. Do not apply any deodorants/lotions/powders.  15. Please wear clean clothes to the hospital.

## 2023-06-16 LAB — HEMOGLOBIN A1C
Hgb A1c MFr Bld: 7.6 % — ABNORMAL HIGH (ref 4.8–5.6)
Mean Plasma Glucose: 171 mg/dL

## 2023-06-23 ENCOUNTER — Ambulatory Visit
Admission: RE | Admit: 2023-06-23 | Discharge: 2023-06-23 | Disposition: A | Discharge status: Home / Self Care | Payer: Medicare HMO | Attending: Surgery | Admitting: Surgery

## 2023-06-23 ENCOUNTER — Ambulatory Visit: Payer: Medicare HMO | Admitting: Certified Registered"

## 2023-06-23 ENCOUNTER — Encounter: Payer: Self-pay | Admitting: Surgery

## 2023-06-23 ENCOUNTER — Other Ambulatory Visit: Payer: Self-pay

## 2023-06-23 ENCOUNTER — Encounter
Admission: RE | Disposition: A | Discharge status: Home / Self Care | Payer: Self-pay | Source: Home / Self Care | Attending: Surgery

## 2023-06-23 DIAGNOSIS — Z7984 Long term (current) use of oral hypoglycemic drugs: Secondary | ICD-10-CM | POA: Insufficient documentation

## 2023-06-23 DIAGNOSIS — Z8249 Family history of ischemic heart disease and other diseases of the circulatory system: Secondary | ICD-10-CM | POA: Diagnosis not present

## 2023-06-23 DIAGNOSIS — I10 Essential (primary) hypertension: Secondary | ICD-10-CM | POA: Insufficient documentation

## 2023-06-23 DIAGNOSIS — E119 Type 2 diabetes mellitus without complications: Secondary | ICD-10-CM | POA: Insufficient documentation

## 2023-06-23 DIAGNOSIS — M1990 Unspecified osteoarthritis, unspecified site: Secondary | ICD-10-CM | POA: Diagnosis not present

## 2023-06-23 DIAGNOSIS — Z87891 Personal history of nicotine dependence: Secondary | ICD-10-CM | POA: Insufficient documentation

## 2023-06-23 DIAGNOSIS — E1122 Type 2 diabetes mellitus with diabetic chronic kidney disease: Secondary | ICD-10-CM

## 2023-06-23 DIAGNOSIS — K219 Gastro-esophageal reflux disease without esophagitis: Secondary | ICD-10-CM | POA: Diagnosis not present

## 2023-06-23 DIAGNOSIS — F0390 Unspecified dementia without behavioral disturbance: Secondary | ICD-10-CM | POA: Diagnosis not present

## 2023-06-23 DIAGNOSIS — Z833 Family history of diabetes mellitus: Secondary | ICD-10-CM | POA: Diagnosis not present

## 2023-06-23 DIAGNOSIS — K4090 Unilateral inguinal hernia, without obstruction or gangrene, not specified as recurrent: Secondary | ICD-10-CM | POA: Diagnosis present

## 2023-06-23 HISTORY — PX: INSERTION OF MESH: SHX5868

## 2023-06-23 LAB — GLUCOSE, CAPILLARY
Glucose-Capillary: 155 mg/dL — ABNORMAL HIGH (ref 70–99)
Glucose-Capillary: 243 mg/dL — ABNORMAL HIGH (ref 70–99)

## 2023-06-23 SURGERY — HERNIORRHAPHY, INGUINAL, ROBOT-ASSISTED, LAPAROSCOPIC
Anesthesia: General | Site: Inguinal | Laterality: Left

## 2023-06-23 MED ORDER — BUPIVACAINE LIPOSOME 1.3 % IJ SUSP
INTRAMUSCULAR | Status: DC | PRN
  Administered 2023-06-23: 20 mL

## 2023-06-23 MED ORDER — ONDANSETRON HCL 4 MG/2ML IJ SOLN
INTRAMUSCULAR | Status: DC | PRN
Start: 2023-06-23 — End: 2023-06-23
  Administered 2023-06-23: 4 mg via INTRAVENOUS

## 2023-06-23 MED ORDER — SUGAMMADEX SODIUM 200 MG/2ML IV SOLN
INTRAVENOUS | Status: DC | PRN
  Administered 2023-06-23: 200 mg via INTRAVENOUS
  Administered 2023-06-23: 5 mg via INTRAVENOUS

## 2023-06-23 MED ORDER — BUPIVACAINE HCL (PF) 0.5 % IJ SOLN
INTRAMUSCULAR | Status: AC
  Filled 2023-06-23: qty 30

## 2023-06-23 MED ORDER — BUPIVACAINE LIPOSOME 1.3 % IJ SUSP
INTRAMUSCULAR | Status: AC
  Filled 2023-06-23: qty 20

## 2023-06-23 MED ORDER — FENTANYL CITRATE (PF) 100 MCG/2ML IJ SOLN
INTRAMUSCULAR | Status: DC | PRN
  Administered 2023-06-23 (×4): 25 ug via INTRAVENOUS

## 2023-06-23 MED ORDER — EPHEDRINE SULFATE (PRESSORS) 50 MG/ML IJ SOLN
INTRAMUSCULAR | Status: DC | PRN
  Administered 2023-06-23: 5 mg via INTRAVENOUS

## 2023-06-23 MED ORDER — CHLORHEXIDINE GLUCONATE 0.12 % MT SOLN
15.0000 mL | Freq: Once | OROMUCOSAL | Status: AC
  Administered 2023-06-23: 15 mL via OROMUCOSAL

## 2023-06-23 MED ORDER — ACETAMINOPHEN 500 MG PO TABS
1000.0000 mg | ORAL_TABLET | ORAL | Status: AC
  Administered 2023-06-23: 1000 mg via ORAL

## 2023-06-23 MED ORDER — ACETAMINOPHEN 325 MG PO TABS: 650.0000 mg | ORAL_TABLET | Freq: Three times a day (TID) | ORAL | 0 refills | Status: AC | PRN

## 2023-06-23 MED ORDER — PROPOFOL 10 MG/ML IV BOLUS
INTRAVENOUS | Status: DC | PRN
Start: 2023-06-23 — End: 2023-06-23
  Administered 2023-06-23: 140 mg via INTRAVENOUS

## 2023-06-23 MED ORDER — ROCURONIUM BROMIDE 100 MG/10ML IV SOLN
INTRAVENOUS | Status: DC | PRN
  Administered 2023-06-23: 10 mg via INTRAVENOUS
  Administered 2023-06-23: 50 mg via INTRAVENOUS

## 2023-06-23 MED ORDER — GABAPENTIN 300 MG PO CAPS
ORAL_CAPSULE | ORAL | Status: AC
  Filled 2023-06-23: qty 1

## 2023-06-23 MED ORDER — LIDOCAINE HCL (CARDIAC) PF 100 MG/5ML IV SOSY
PREFILLED_SYRINGE | INTRAVENOUS | Status: DC | PRN
  Administered 2023-06-23: 80 mg via INTRAVENOUS

## 2023-06-23 MED ORDER — FAMOTIDINE 20 MG PO TABS
ORAL_TABLET | ORAL | Status: AC
  Filled 2023-06-23: qty 1

## 2023-06-23 MED ORDER — ORAL CARE MOUTH RINSE: 15.0000 mL | Freq: Once | OROMUCOSAL | Status: AC

## 2023-06-23 MED ORDER — FAMOTIDINE 20 MG PO TABS
20.0000 mg | ORAL_TABLET | Freq: Once | ORAL | Status: AC
  Administered 2023-06-23: 20 mg via ORAL

## 2023-06-23 MED ORDER — BUPIVACAINE-EPINEPHRINE 0.5% -1:200000 IJ SOLN
INTRAMUSCULAR | Status: DC | PRN
  Administered 2023-06-23: 14 mL

## 2023-06-23 MED ORDER — CELECOXIB 200 MG PO CAPS
200.0000 mg | ORAL_CAPSULE | ORAL | Status: AC
  Administered 2023-06-23: 200 mg via ORAL

## 2023-06-23 MED ORDER — CEFAZOLIN SODIUM-DEXTROSE 2-4 GM/100ML-% IV SOLN
2.0000 g | INTRAVENOUS | Status: AC
  Administered 2023-06-23 (×2): 2 g via INTRAVENOUS

## 2023-06-23 MED ORDER — CELECOXIB 200 MG PO CAPS
ORAL_CAPSULE | ORAL | Status: AC
  Filled 2023-06-23: qty 1

## 2023-06-23 MED ORDER — LACTATED RINGERS IV SOLN: INTRAVENOUS | Status: DC

## 2023-06-23 MED ORDER — GABAPENTIN 300 MG PO CAPS
300.0000 mg | ORAL_CAPSULE | ORAL | Status: AC
  Administered 2023-06-23: 300 mg via ORAL

## 2023-06-23 MED ORDER — HYDROCODONE-ACETAMINOPHEN 5-325 MG PO TABS: 1.0000 | ORAL_TABLET | Freq: Four times a day (QID) | ORAL | 0 refills | Status: DC | PRN

## 2023-06-23 MED ORDER — PHENYLEPHRINE HCL (PRESSORS) 10 MG/ML IV SOLN
INTRAVENOUS | Status: DC | PRN
  Administered 2023-06-23: 80 ug via INTRAVENOUS

## 2023-06-23 MED ORDER — EPINEPHRINE PF 1 MG/ML IJ SOLN
INTRAMUSCULAR | Status: AC
  Filled 2023-06-23: qty 1

## 2023-06-23 MED ORDER — FENTANYL CITRATE (PF) 100 MCG/2ML IJ SOLN: 25.0000 ug | INTRAMUSCULAR | Status: DC | PRN

## 2023-06-23 MED ORDER — FENTANYL CITRATE (PF) 100 MCG/2ML IJ SOLN
INTRAMUSCULAR | Status: AC
  Filled 2023-06-23: qty 2

## 2023-06-23 MED ORDER — SODIUM CHLORIDE 0.9 % IV SOLN: INTRAVENOUS | Status: DC

## 2023-06-23 MED ORDER — MIDAZOLAM HCL 2 MG/2ML IJ SOLN
INTRAMUSCULAR | Status: AC
  Filled 2023-06-23: qty 2

## 2023-06-23 MED ORDER — ESMOLOL HCL 100 MG/10ML IV SOLN
INTRAVENOUS | Status: DC | PRN
  Administered 2023-06-23 (×2): 10 mg via INTRAVENOUS

## 2023-06-23 MED ORDER — CHLORHEXIDINE GLUCONATE CLOTH 2 % EX PADS
6.0000 | MEDICATED_PAD | Freq: Once | CUTANEOUS | Status: AC
  Administered 2023-06-23: 6 via TOPICAL

## 2023-06-23 MED ORDER — CEFAZOLIN SODIUM-DEXTROSE 2-4 GM/100ML-% IV SOLN
INTRAVENOUS | Status: AC
  Filled 2023-06-23: qty 100

## 2023-06-23 MED ORDER — ACETAMINOPHEN 500 MG PO TABS
ORAL_TABLET | ORAL | Status: AC
  Filled 2023-06-23: qty 2

## 2023-06-23 MED ORDER — CHLORHEXIDINE GLUCONATE 0.12 % MT SOLN
OROMUCOSAL | Status: AC
  Filled 2023-06-23: qty 15

## 2023-06-23 MED ORDER — DOCUSATE SODIUM 100 MG PO CAPS: 100.0000 mg | ORAL_CAPSULE | Freq: Two times a day (BID) | ORAL | 0 refills | Status: AC | PRN

## 2023-06-23 SURGICAL SUPPLY — 51 items
ADH SKN CLS APL DERMABOND .7 (GAUZE/BANDAGES/DRESSINGS) ×2
BAG PRESSURE INF REUSE 1000 (BAG) IMPLANT
BLADE SURG SZ11 CARB STEEL (BLADE) ×2 IMPLANT
BNDG GAUZE DERMACEA FLUFF 4 (GAUZE/BANDAGES/DRESSINGS) ×2 IMPLANT
BNDG GZE DERMACEA 4 6PLY (GAUZE/BANDAGES/DRESSINGS) ×2
COVER TIP SHEARS 8 DVNC (MISCELLANEOUS) ×2 IMPLANT
COVER WAND RF STERILE (DRAPES) ×2 IMPLANT
DERMABOND ADVANCED .7 DNX12 (GAUZE/BANDAGES/DRESSINGS) ×2 IMPLANT
DRAPE ARM DVNC X/XI (DISPOSABLE) ×6 IMPLANT
DRAPE COLUMN DVNC XI (DISPOSABLE) ×2 IMPLANT
ELECT CAUTERY BLADE 6.4 (BLADE) IMPLANT
ELECT REM PT RETURN 9FT ADLT (ELECTROSURGICAL) ×2 IMPLANT
ELECTRODE REM PT RTRN 9FT ADLT (ELECTROSURGICAL) ×2 IMPLANT
FORCEPS BPLR FENES DVNC XI (FORCEP) ×2 IMPLANT
GLOVE BIOGEL PI IND STRL 7.0 (GLOVE) ×4 IMPLANT
GLOVE SURG SYN 6.5 ES PF (GLOVE) ×8 IMPLANT
GLOVE SURG SYN 6.5 PF PI (GLOVE) ×4 IMPLANT
GOWN STRL REUS W/ TWL LRG LVL3 (GOWN DISPOSABLE) ×6 IMPLANT
GOWN STRL REUS W/TWL LRG LVL3 (GOWN DISPOSABLE) ×8
IRRIGATOR SUCT 8 DISP DVNC XI (IRRIGATION / IRRIGATOR) IMPLANT
IV NS 1000ML (IV SOLUTION)
IV NS 1000ML BAXH (IV SOLUTION) IMPLANT
LABEL OR SOLS (LABEL) IMPLANT
MANIFOLD NEPTUNE II (INSTRUMENTS) ×2 IMPLANT
MESH 3DMAX MID 4X6 LT LRG (Mesh General) IMPLANT
NDL DRIVE SUT CUT DVNC (INSTRUMENTS) ×2 IMPLANT
NDL HYPO 22X1.5 SAFETY MO (MISCELLANEOUS) ×2 IMPLANT
NDL INSUFFLATION 14GA 120MM (NEEDLE) ×2 IMPLANT
NEEDLE DRIVE SUT CUT DVNC (INSTRUMENTS) ×2 IMPLANT
NEEDLE HYPO 22X1.5 SAFETY MO (MISCELLANEOUS) ×2 IMPLANT
NEEDLE INSUFFLATION 14GA 120MM (NEEDLE) ×2 IMPLANT
OBTURATOR OPTICAL STND 8 DVNC (TROCAR) ×2 IMPLANT
OBTURATOR OPTICALSTD 8 DVNC (TROCAR) ×2 IMPLANT
PACK LAP CHOLECYSTECTOMY (MISCELLANEOUS) ×2 IMPLANT
PENCIL SMOKE EVACUATOR (MISCELLANEOUS) ×2 IMPLANT
SCISSORS MNPLR CVD DVNC XI (INSTRUMENTS) ×2 IMPLANT
SEAL UNIV 5-12 XI (MISCELLANEOUS) ×6 IMPLANT
SET TUBE SMOKE EVAC HIGH FLOW (TUBING) ×2 IMPLANT
SOL ELECTROSURG ANTI STICK (MISCELLANEOUS) ×2 IMPLANT
SOLUTION ELECTROSURG ANTI STCK (MISCELLANEOUS) ×2 IMPLANT
SUT MNCRL 4-0 (SUTURE) ×2
SUT MNCRL 4-0 27 PS-2 XMFL (SUTURE) ×2 IMPLANT
SUT MNCRL 4-0 27XMFL (SUTURE) ×2
SUT VIC AB 2-0 SH 27 (SUTURE) ×2
SUT VIC AB 2-0 SH 27XBRD (SUTURE) ×2 IMPLANT
SUT VLOC 90 6 CV-15 VIOLET (SUTURE) ×2 IMPLANT
SUTURE MNCRL 4-0 27XMF (SUTURE) ×2 IMPLANT
SYR 30ML LL (SYRINGE) ×2 IMPLANT
TAPE TRANSPORE STRL 2 31045 (GAUZE/BANDAGES/DRESSINGS) ×2 IMPLANT
TRAP FLUID SMOKE EVACUATOR (MISCELLANEOUS) ×2 IMPLANT
WATER STERILE IRR 500ML POUR (IV SOLUTION) ×2 IMPLANT

## 2023-06-23 NOTE — Discharge Instructions (Addendum)
Hernia repair, Care After This sheet gives you information about how to care for yourself after your procedure. Your health care provider may also give you more specific instructions. If you have problems or questions, contact your health care provider. What can I expect after the procedure? After your procedure, it is common to have the following: Pain in your abdomen, especially in the incision areas. You will be given medicine to control the pain. Tiredness. This is a normal part of the recovery process. Your energy level will return to normal over the next several weeks. Changes in your bowel movements, such as constipation or needing to go more often. Talk with your health care provider about how to manage this. Follow these instructions at home: Medicines  tylenol and advil as needed for discomfort.  Please alternate between the two every four hours as needed for pain.    Use narcotics, if prescribed, only when tylenol and motrin is not enough to control pain.  325-650mg every 8hrs to max of 3000mg/24hrs (including the 325mg in every norco dose) for the tylenol.    Advil up to 800mg per dose every 8hrs as needed for pain.   PLEASE RECORD NUMBER OF PILLS TAKEN UNTIL NEXT FOLLOW UP APPT.  THIS WILL HELP DETERMINE HOW READY YOU ARE TO BE RELEASED FROM ANY ACTIVITY RESTRICTIONS Do not drive or use heavy machinery while taking prescription pain medicine. Do not drink alcohol while taking prescription pain medicine.  Incision care    Follow instructions from your health care provider about how to take care of your incision areas. Make sure you: Keep your incisions clean and dry. Wash your hands with soap and water before and after applying medicine to the areas, and before and after changing your bandage (dressing). If soap and water are not available, use hand sanitizer. Change your dressing as told by your health care provider. Leave stitches (sutures), skin glue, or adhesive strips in  place. These skin closures may need to stay in place for 2 weeks or longer. If adhesive strip edges start to loosen and curl up, you may trim the loose edges. Do not remove adhesive strips completely unless your health care provider tells you to do that. Do not wear tight clothing over the incisions. Tight clothing may rub and irritate the incision areas, which may cause the incisions to open. Do not take baths, swim, or use a hot tub until your health care provider approves. OK TO SHOWER IN 24HRS.   Check your incision area every day for signs of infection. Check for: More redness, swelling, or pain. More fluid or blood. Warmth. Pus or a bad smell. Activity Avoid lifting anything that is heavier than 10 lb (4.5 kg) for 2 weeks or until your health care provider says it is okay. No pushing/pulling greater than 30lbs You may resume normal activities as told by your health care provider. Ask your health care provider what activities are safe for you. Take rest breaks during the day as needed. Eating and drinking Follow instructions from your health care provider about what you can eat after surgery. To prevent or treat constipation while you are taking prescription pain medicine, your health care provider may recommend that you: Drink enough fluid to keep your urine clear or pale yellow. Take over-the-counter or prescription medicines. Eat foods that are high in fiber, such as fresh fruits and vegetables, whole grains, and beans. Limit foods that are high in fat and processed sugars, such as fried and   sweet foods. General instructions Ask your health care provider when you will need an appointment to get your sutures or staples removed. Keep all follow-up visits as told by your health care provider. This is important. Contact a health care provider if: You have more redness, swelling, or pain around your incisions. You have more fluid or blood coming from the incisions. Your incisions feel  warm to the touch. You have pus or a bad smell coming from your incisions or your dressing. You have a fever. You have an incision that breaks open (edges not staying together) after sutures or staples have been removed. You develop a rash. You have chest pain or difficulty breathing. You have pain or swelling in your legs. You feel light-headed or you faint. Your abdomen swells (becomes distended). You have nausea or vomiting. You have blood in your stool (feces). This information is not intended to replace advice given to you by your health care provider. Make sure you discuss any questions you have with your health care provider. Document Released: 05/06/2005 Document Revised: 07/06/2018 Document Reviewed: 07/18/2016 Elsevier Interactive Patient Education  2019 [employer]   AMBULATORY SURGERY  DISCHARGE INSTRUCTIONS   The drugs that you were given will stay in your system until tomorrow so for the next 24 hours you should not:  Drive an automobile Make any legal decisions Drink any alcoholic beverage   You may resume regular meals tomorrow.  Today it is better to start with liquids and gradually work up to solid foods.  You may eat anything you prefer, but it is better to start with liquids, then soup and crackers, and gradually work up to solid foods.   Please notify your doctor immediately if you have any unusual bleeding, trouble breathing, redness and pain at the surgery site, drainage, fever, or pain not relieved by medication.    Additional Instructions: PLEASE LEAVE EXPAREL (TEAL) ARMBAND ON FOR 4 DAYS    Please contact your physician with any problems or Same Day Surgery at [phone], Monday through Friday 6 am to 4 pm, or [place] [facility] number at [phone]. 

## 2023-06-23 NOTE — Op Note (Signed)
Preoperative diagnosis: Left, initial, reducible inguinal Hernia.  Postoperative diagnosis: Left inguinal Hernia  Procedure: Robotic assisted laparoscopic left inguinal hernia repair with mesh  Anesthesia: General  Surgeon: Dr. Tonna Boehringer  Wound Classification: Clean  Specimen: none  Complications: None  Estimated Blood Loss: 10mL   Indications:  inguinal hernia. Repair was indicated to avoid complications of incarceration, obstruction and pain, and a prosthetic mesh repair was elected.  See H&P for further details.  Findings: 1. Vas Deferens and cord structures identified and preserved 2. Bard 3D max medium weight mesh used for repair 3. Adequate hemostasis achieved  Description of procedure: The patient was taken to the operating room. A time-out was completed verifying correct patient, procedure, site, positioning, and implant(s) and/or special equipment prior to beginning this procedure.  Area was prepped and draped in the usual sterile fashion. An incision was marked 20 cm above the pubic tubercle, slightly above the umbilicus  Scrotum wrapped in Kerlix roll.  Veress needle inserted at palmer's point.  Saline drop test noted to be positive with gradual increase in pressure after initiation of gas insufflation.  15 mm of pressure was achieved prior to removing the Veress needle and then placing a 8 mm port via the Optiview technique through the supraumbilical site.  Inspection of the area afterwards noted no injury to the surrounding organs during insertion of the needle and the port.  2 port sites were marked 8 cm to the lateral sides of the initial port, and a 8 mm robotic port was placed on the left side, another 8 mm robotic port on the right side under direct supervision.  Local anesthesia  infused to the preplanned incision sites prior to insertion of the port.  The [employer] platform was then brought into the operative field and docked to the ports.  Examination of the  abdominal cavity noted a left inguinal hernia, containing sigmoid colon as well as a dense adhesive band attached to the colon.  Colon was fully reduced and the dense adhesive band ligated using fenestrated forceps.  A peritoneal flap was created approximately 8cm cephalad to the defect by using scissors with electrocautery.  Dissection was carried down towards the pubic tubercle, developing the myopectineal orifice view.  Laterally the flap was carried towards the ASIS.  Large hernia sac was noted, which carefully dissected away from the adjacent tissues to be fully reduced out of hernia cavity.  Any bleeding was controlled with combination of electrocautery and manual pressure.    After confirming adequate dissection and the peritoneal reflection completely down and away from the cord structures, a Large Bard 3DMax medium weight mesh was placed within the anterior abdominal wall, secured in place using 2-0 Vicryl on an SH needle immediately above the pubic tubercle.  After noting proper placement of the mesh with the peritoneal reflection deep to it, the previously created peritoneal flap was secured back up to the anterior abdominal wall using running 3-0 V-Lock.  Additional 3-0 V-Loc was used to close the large peritoneal defect that was created during reduction of the hernia sac.  All needles were then removed out of the abdominal cavity, Xi platform undocked from the ports and removed off of operative field.  exparel infused as ilioinguinal block.  Abdomen then desufflated and ports removed. All the skin incisions were then closed with a subcuticular stitch of Monocryl 4-0. Dermabond was applied. The testis was gently pulled down into its anatomic position in the scrotum.  The patient tolerated the procedure  well and was taken to the postanesthesia care unit in stable condition. Sponge and instrument count correct at end of procedure.

## 2023-06-23 NOTE — Anesthesia Preprocedure Evaluation (Signed)
Anesthesia Evaluation  Patient identified by MRN, date of birth, ID band Patient awake    Reviewed: Allergy & Precautions, NPO status , Patient's Chart, lab work & pertinent test results  History of Anesthesia Complications Negative for: history of anesthetic complications  Airway Mallampati: III  TM Distance: >3 FB Neck ROM: full    Dental  (+) Edentulous Upper, Edentulous Lower   Pulmonary neg pulmonary ROS, former smoker   Pulmonary exam normal        Cardiovascular hypertension, (-) angina (-) Past MI and (-) Cardiac Stents Normal cardiovascular exam(-) dysrhythmias (-) Valvular Problems/Murmurs     Neuro/Psych  PSYCHIATRIC DISORDERS     Dementia negative neurological ROS  negative psych ROS   GI/Hepatic negative GI ROS, Neg liver ROS,GERD  Controlled,,  Endo/Other  diabetes    Renal/GU Renal disease  negative genitourinary   Musculoskeletal  (+) Arthritis ,    Abdominal   Peds  Hematology negative hematology ROS (+) Blood dyscrasia, anemia   Anesthesia Other Findings Past Medical History: No date: Anemia No date: Atherosclerosis of abdominal aorta (HCC) 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  Only wears upper  Past Surgical History: 03/01/2020: AMPUTATION TOE; Right     Comment:  Procedure: AMPUTATION TOE;  Surgeon: [name],                DPM;  Location: [facility];  Service: Podiatry;                Laterality: Right; No date: APPENDECTOMY 09/06/2021: CATARACT EXTRACTION W/PHACO; Left     Comment:  Procedure: CATARACT EXTRACTION PHACO AND INTRAOCULAR               LENS PLACEMENT (IOC) LEFT 2.71 00:30.6;  Surgeon: Nevada Crane, MD;  Location: [facility];                Service: Ophthalmology;  Laterality: Left;  Diabetic No date: EYE SURGERY     Comment:  age 2 No date: KIDNEY STONE SURGERY 04/19/2021: REMOVAL RETAINED LENS; Right     Comment:  Procedure: CATARACT EXTRACTION PHACO,  RIGHT VISION BLUE              0.20 00:01.4 case aborted;  Surgeon: Nevada Crane,               MD;  Location: [facility];  Service:               Ophthalmology;  Laterality: Right;  Diabetic - oral meds 10/06/2016: SHOULDER ARTHROSCOPY WITH BICEPS TENDON REPAIR; Right     Comment:  Procedure: SHOULDER ARTHROSCOPY WITH BICEPS TENDON               REPAIR;  Surgeon: Christena Flake, MD;  Location: [facility];  Service: Orthopedics;  Laterality: Right; 10/06/2016: SHOULDER ARTHROSCOPY WITH OPEN ROTATOR CUFF REPAIR; Right     Comment:  Procedure: SHOULDER ARTHROSCOPY WITH OPEN ROTATOR CUFF               REPAIR;  Surgeon: Christena Flake, MD;  Location: [facility];               Service: Orthopedics;  Laterality: Right; 10/06/2016: SHOULDER ARTHROSCOPY WITH SUBACROMIAL DECOMPRESSION; Right     Comment:  Procedure: SHOULDER ARTHROSCOPY WITH SUBACROMIAL               DECOMPRESSION AND DEBRIDEMENT;  Surgeon: Christena Flake,               MD;  Location: [facility];  Service: Orthopedics;                Laterality: Right; No date: TONSILLECTOMY  BMI    Body Mass Index: 20.56 kg/m      Reproductive/Obstetrics negative OB ROS                             Anesthesia Physical Anesthesia Plan  ASA: 3  Anesthesia Plan: General   Post-op Pain Management:    Induction:  Intravenous  PONV Risk Score and Plan: Ondansetron, Dexamethasone and Treatment may vary due to age or medical condition  Airway Management Planned: Oral ETT  Additional Equipment:   Intra-op Plan:   Post-operative Plan: Extubation in OR  Informed Consent: I have reviewed the patients History and Physical, chart, labs and discussed the procedure including the risks, benefits and alternatives for the proposed anesthesia with the patient or authorized representative who has indicated his/her understanding and acceptance.     Dental Advisory Given  Plan Discussed with: Anesthesiologist, CRNA and Surgeon  Anesthesia Plan Comments: (Patient consented for risks of anesthesia including but not limited to:  - adverse reactions to medications - risk of airway placement if required - damage to eyes, teeth, lips or other oral mucosa - nerve damage due to positioning  - sore throat or hoarseness - Damage to heart, brain, nerves, lungs, other parts of body or loss of life  Patient voiced understanding.)       Anesthesia Quick Evaluation

## 2023-06-23 NOTE — Transfer of Care (Signed)
Immediate Anesthesia Transfer of Care Note  Patient: Alex TAIT Sr.  Procedure(s) Performed: XI ROBOTIC ASSISTED INGUINAL HERNIA (Left: Inguinal) INSERTION OF MESH  Patient Location: PACU  Anesthesia Type:General  Level of Consciousness: awake and alert   Airway & Oxygen Therapy: Patient Spontanous Breathing and Patient connected to face mask oxygen  Post-op Assessment: Report given to RN and Post -op Vital signs reviewed and stable  Post vital signs: Reviewed and stable  Last Vitals:  Vitals Value Taken Time  BP 162/75 06/23/23 1112   Temp 97.7  06-23-23 1114   Pulse 63 06/23/23 1112  Resp 14 06/23/23 1112  SpO2 100 % 06/23/23 1112  Vitals shown include unfiled device data. Brought pts forced air Bair hugger blanket to PACU and restarted in on top and under warm blankets.  Last Pain:  Vitals:   06/23/23 0729  PainSc: 0-No pain         Complications: No notable events documented.

## 2023-06-23 NOTE — Anesthesia Procedure Notes (Addendum)
Procedure Name: Intubation Date/Time: 06/23/2023 9:09 AM  Performed by: Genia Del, CRNAPre-anesthesia Checklist: Patient identified, Patient being monitored, Timeout performed, Emergency Drugs available and Suction available Patient Re-evaluated:Patient Re-evaluated prior to induction Oxygen Delivery Method: Circle system utilized Preoxygenation: Pre-oxygenation with 100% oxygen Induction Type: IV induction Ventilation: Mask ventilation without difficulty and Oral airway inserted - appropriate to patient size Laryngoscope Size: McGraph and 4 Grade View: Grade I Tube type: Oral Tube size: 7.5 mm Number of attempts: 1 Airway Equipment and Method: Stylet Placement Confirmation: ETT inserted through vocal cords under direct vision, positive ETCO2 and breath sounds checked- equal and bilateral Secured at: 23 cm Tube secured with: Tape Dental Injury: Teeth and Oropharynx as per pre-operative assessment  Comments: Required 90 mm oral aw to mask for edentulous pt.   Grade 1 easy view with McGrath videolaryngoscopy.    Required rotation of ETT 180 degrees for easy placement.   18 Fr OGT placed to CLWS.

## 2023-06-23 NOTE — Progress Notes (Signed)
Patient voided prior to discharge clear yellow urine. Reviewed discharge instructions with patient, patient's wife and son, verbalizes understanding.  Tolerated water, gingerale and crackers without event.

## 2023-06-23 NOTE — Interval H&P Note (Signed)
History and Physical Interval Note:  06/23/2023 8:30 AM  Alex Soto Sr.  has presented today for surgery, with the diagnosis of non-recurrent unilateral inguinal hernia w/o obstruction or gangrene K40.90.  The various methods of treatment have been discussed with the patient and family. After consideration of risks, benefits and other options for treatment, the patient has consented to  Procedure(s): XI ROBOTIC ASSISTED INGUINAL HERNIA (Left) as a surgical intervention.  The patient's history has been reviewed, patient examined, no change in status, stable for surgery.  I have reviewed the patient's chart and labs.  Questions were answered to the patient's satisfaction.     James Senn Tonna Boehringer

## 2023-06-26 ENCOUNTER — Encounter: Payer: Self-pay | Admitting: Surgery

## 2023-06-28 NOTE — Anesthesia Postprocedure Evaluation (Signed)
Anesthesia Post Note  Patient: Alex BAGLEY Sr.  Procedure(s) Performed: XI ROBOTIC ASSISTED INGUINAL HERNIA (Left: Inguinal) INSERTION OF MESH  Patient location during evaluation: PACU Anesthesia Type: General Level of consciousness: awake and alert Pain management: pain level controlled Vital Signs Assessment: post-procedure vital signs reviewed and stable Respiratory status: spontaneous breathing, nonlabored ventilation, respiratory function stable and patient connected to nasal cannula oxygen Cardiovascular status: blood pressure returned to baseline and stable Postop Assessment: no apparent nausea or vomiting Anesthetic complications: no   No notable events documented.   Last Vitals:  Vitals:   06/23/23 1155 06/23/23 1234  BP: (!) 156/68 (!) 150/63  Pulse: (!) 51 (!) 57  Resp: 16 16  Temp: (!) 36.1 C   SpO2: 94% 99%    Last Pain:  Vitals:   06/23/23 1234  TempSrc:   PainSc: 3                  Lenard Simmer

## 2023-10-14 ENCOUNTER — Encounter: Payer: Self-pay | Admitting: Emergency Medicine

## 2023-10-14 ENCOUNTER — Emergency Department: Payer: Medicare HMO

## 2023-10-14 ENCOUNTER — Other Ambulatory Visit: Payer: Self-pay

## 2023-10-14 ENCOUNTER — Emergency Department
Admission: EM | Admit: 2023-10-14 | Discharge: 2023-10-14 | Disposition: A | Discharge status: Home / Self Care | Payer: Medicare HMO | Attending: Student in an Organized Health Care Education/Training Program | Admitting: Student in an Organized Health Care Education/Training Program

## 2023-10-14 DIAGNOSIS — R1031 Right lower quadrant pain: Secondary | ICD-10-CM | POA: Diagnosis present

## 2023-10-14 HISTORY — DX: Unspecified dementia, unspecified severity, without behavioral disturbance, psychotic disturbance, mood disturbance, and anxiety: F03.90

## 2023-10-14 LAB — CBC
HCT: 28.5 % — ABNORMAL LOW (ref 39.0–52.0)
Hemoglobin: 9.1 g/dL — ABNORMAL LOW (ref 13.0–17.0)
MCH: 28 pg (ref 26.0–34.0)
MCHC: 31.9 g/dL (ref 30.0–36.0)
MCV: 87.7 fL (ref 80.0–100.0)
Platelets: 251 10*3/uL (ref 150–400)
RBC: 3.25 MIL/uL — ABNORMAL LOW (ref 4.22–5.81)
RDW: 15.5 % (ref 11.5–15.5)
WBC: 6.4 10*3/uL (ref 4.0–10.5)
nRBC: 0 % (ref 0.0–0.2)

## 2023-10-14 LAB — COMPREHENSIVE METABOLIC PANEL
ALT: 17 U/L (ref 0–44)
AST: 22 U/L (ref 15–41)
Albumin: 3.6 g/dL (ref 3.5–5.0)
Alkaline Phosphatase: 104 U/L (ref 38–126)
Anion gap: 10 (ref 5–15)
BUN: 14 mg/dL (ref 8–23)
CO2: 26 mmol/L (ref 22–32)
Calcium: 8.4 mg/dL — ABNORMAL LOW (ref 8.9–10.3)
Chloride: 94 mmol/L — ABNORMAL LOW (ref 98–111)
Creatinine, Ser: 0.81 mg/dL (ref 0.61–1.24)
GFR, Estimated: >60 mL/min (ref >60)
Glucose, Bld: 174 mg/dL — ABNORMAL HIGH (ref 70–99)
Potassium: 3.2 mmol/L — ABNORMAL LOW (ref 3.5–5.1)
Sodium: 130 mmol/L — ABNORMAL LOW (ref 135–145)
Total Bilirubin: 0.6 mg/dL (ref <1.2)
Total Protein: 6.8 g/dL (ref 6.5–8.1)

## 2023-10-14 LAB — URINALYSIS, ROUTINE W REFLEX MICROSCOPIC
Bacteria, UA: NONE SEEN
Bilirubin Urine: NEGATIVE
Glucose, UA: NEGATIVE mg/dL
Hgb urine dipstick: NEGATIVE
Ketones, ur: NEGATIVE mg/dL
Leukocytes,Ua: NEGATIVE
Nitrite: NEGATIVE
Protein, ur: 100 mg/dL — AB
Specific Gravity, Urine: 1.015 (ref 1.005–1.030)
Squamous Epithelial / HPF: 0 /[HPF] (ref 0–5)
pH: 6 (ref 5.0–8.0)

## 2023-10-14 LAB — LACTIC ACID, PLASMA: Lactic Acid, Venous: 1.2 mmol/L (ref 0.5–1.9)

## 2023-10-14 LAB — LIPASE, BLOOD: Lipase: 26 U/L (ref 11–51)

## 2023-10-14 MED ORDER — METRONIDAZOLE 500 MG PO TABS
250.0000 mg | ORAL_TABLET | Freq: Once | ORAL | Status: AC
  Administered 2023-10-14: 250 mg via ORAL
  Filled 2023-10-14: qty 1

## 2023-10-14 MED ORDER — CIPROFLOXACIN HCL 500 MG PO TABS
250.0000 mg | ORAL_TABLET | Freq: Once | ORAL | Status: AC
  Administered 2023-10-14: 250 mg via ORAL
  Filled 2023-10-14: qty 1

## 2023-10-14 MED ORDER — CIPROFLOXACIN HCL 250 MG PO TABS: 250.0000 mg | ORAL_TABLET | Freq: Two times a day (BID) | ORAL | 0 refills | Status: AC

## 2023-10-14 MED ORDER — METRONIDAZOLE 500 MG PO TABS: 500.0000 mg | ORAL_TABLET | Freq: Two times a day (BID) | ORAL | 0 refills | Status: AC

## 2023-10-14 MED ORDER — IOHEXOL 300 MG/ML  SOLN
100.0000 mL | Freq: Once | INTRAMUSCULAR | Status: AC | PRN
  Administered 2023-10-14: 100 mL via INTRAVENOUS

## 2023-10-14 NOTE — ED Provider Notes (Signed)
[facility] Provider Note    Event Date/Time   First MD Initiated Contact with Patient 10/14/23 [phone]     (approximate)   History   Abdominal Pain   HPI  Alex Soto. is a 77 y.o. male who presents to the ER for evaluation of abdominal pain.  He states has been intermittent for quite some time ever since he had hernia surgery many years ago but feels like it is getting worse.  Coronary family will lift his belly up and pain comes and goes.  No nausea or vomiting no diarrhea.  Denies any significant pain at this moment.     Physical Exam   Triage Vital Signs: ED Triage Vitals  Encounter Vitals Group     BP 10/14/23 0317 (!) 164/72     Systolic BP Percentile --      Diastolic BP Percentile --      Pulse Rate 10/14/23 0317 (!) 53     Resp 10/14/23 0317 16     Temp 10/14/23 0317 98 F (36.7 C)     Temp src --      SpO2 10/14/23 0317 99 %     Weight 10/14/23 0317 152 lb 12.8 oz (69.3 kg)     Height 10/14/23 0317 6\' 1"  (1.854 m)     Head Circumference --      Peak Flow --      Pain Score 10/14/23 0316 7     Pain Loc --      Pain Education --      Exclude from Growth Chart --     Most recent vital signs: Vitals:   10/14/23 0317 10/14/23 0627  BP: (!) 164/72 (!) 161/75  Pulse: (!) 53 (!) 55  Resp: 16 14  Temp: 98 F (36.7 C)   SpO2: 99% 99%     Constitutional: Alert  Eyes: Conjunctivae are normal.  Head: Atraumatic. Nose: No congestion/rhinnorhea. Mouth/Throat: Mucous membranes are moist.   Neck: Painless ROM.  Cardiovascular:   Good peripheral circulation. Respiratory: Normal respiratory effort.  No retractions.  Gastrointestinal: Soft and nontender.  Musculoskeletal:  no deformity Neurologic:  MAE spontaneously. No gross focal neurologic deficits are appreciated.  Skin:  Skin is warm, dry and intact. No rash noted. Psychiatric: Mood and affect are normal. Speech and behavior are normal.    ED Results / Procedures /  Treatments   Labs (all labs ordered are listed, but only abnormal results are displayed) Labs Reviewed  COMPREHENSIVE METABOLIC PANEL - Abnormal; Notable for the following components:      Result Value   Sodium 130 (*)    Potassium 3.2 (*)    Chloride 94 (*)    Glucose, Bld 174 (*)    Calcium 8.4 (*)    All other components within normal limits  CBC - Abnormal; Notable for the following components:   RBC 3.25 (*)    Hemoglobin 9.1 (*)    HCT 28.5 (*)    All other components within normal limits  URINALYSIS, ROUTINE W REFLEX MICROSCOPIC - Abnormal; Notable for the following components:   Color, Urine YELLOW (*)    APPearance CLEAR (*)    Protein, ur 100 (*)    All other components within normal limits  LIPASE, BLOOD  LACTIC ACID, PLASMA  LACTIC ACID, PLASMA     EKG     RADIOLOGY Please see ED Course for my review and interpretation.  I personally reviewed all radiographic images ordered  to evaluate for the above acute complaints and reviewed radiology reports and findings.  These findings were personally discussed with the patient.  Please see medical record for radiology report.    PROCEDURES:  Critical Care performed: No  Procedures   MEDICATIONS ORDERED IN ED: Medications  ciprofloxacin (CIPRO) tablet 250 mg (has no administration in time range)  metroNIDAZOLE (FLAGYL) tablet 250 mg (has no administration in time range)  iohexol (OMNIPAQUE) 300 MG/ML solution 100 mL (100 mLs Intravenous Contrast Given 10/14/23 0640)     IMPRESSION / MDM / ASSESSMENT AND PLAN / ED COURSE  I reviewed the triage vital signs and the nursing notes.                              Differential diagnosis includes, but is not limited to, colitis, mesenteric ischemia, mass, diverticulitis, appendicitis  Patient presenting to the ER for evaluation of symptoms as described above.  Based on symptoms, risk factors and considered above differential, this presenting complaint could  reflect a potentially life-threatening illness therefore the patient will be placed on continuous pulse oximetry and telemetry for monitoring.  Laboratory evaluation will be sent to evaluate for the above complaints.  CT imaging on my review and interpretation without evidence of perforation.  Per radiology findings concerning for colitis.  No significant leukocytosis will add on lactate.  His abdominal exam is benign and he is not complaining of any significant discomfort at this time.    Clinical Course as of 10/14/23 0902  Sat Oct 14, 2023  0859 Lactate is normal.  Given CT findings age and risk factors will place on antibiotic for possible infectious component do not feel that he requires hospitalization at this time as he is otherwise well-appearing with benign abdominal exam.  We discussed the importance of outpatient follow-up with GI and need for colonoscopy for further evaluation.  This conversation was heard by patient's son.  We discussed signs and symptoms for which the patient should return to the ER.  He is agreeable with plan. [PR]    Clinical Course User Index [PR] Willy Eddy, MD     FINAL CLINICAL IMPRESSION(S) / ED DIAGNOSES   Final diagnoses:  Right lower quadrant abdominal pain     Rx / DC Orders   ED Discharge Orders          Ordered    ciprofloxacin (CIPRO) 250 MG tablet  2 times daily        10/14/23 0901    metroNIDAZOLE (FLAGYL) 500 MG tablet  2 times daily        10/14/23 0901             Note:  This document was prepared using Dragon voice recognition software and may include unintentional dictation errors.    Willy Eddy, MD 10/14/23 [phone]

## 2023-10-14 NOTE — ED Triage Notes (Signed)
Pt to ED from home c/o lower abd pain for a couple days that is sharp.  Denies n/v/d, denies urinary changes, denies constipation.  When pain comes on patient will sometimes lift stomach with some relief.  Pt has hx of dementia, here with son.

## 2023-12-12 ENCOUNTER — Encounter: Payer: Self-pay | Admitting: Internal Medicine

## 2023-12-20 ENCOUNTER — Encounter
Admission: RE | Disposition: A | Discharge status: Home / Self Care | Payer: Self-pay | Source: Home / Self Care | Attending: Internal Medicine

## 2023-12-20 ENCOUNTER — Ambulatory Visit
Admission: RE | Admit: 2023-12-20 | Discharge: 2023-12-20 | Disposition: A | Discharge status: Home / Self Care | Payer: Medicare PPO | Attending: Internal Medicine | Admitting: Internal Medicine

## 2023-12-20 ENCOUNTER — Ambulatory Visit: Payer: Medicare PPO | Admitting: Anesthesiology

## 2023-12-20 DIAGNOSIS — K219 Gastro-esophageal reflux disease without esophagitis: Secondary | ICD-10-CM | POA: Diagnosis not present

## 2023-12-20 DIAGNOSIS — R1310 Dysphagia, unspecified: Secondary | ICD-10-CM | POA: Diagnosis not present

## 2023-12-20 DIAGNOSIS — K2970 Gastritis, unspecified, without bleeding: Secondary | ICD-10-CM | POA: Insufficient documentation

## 2023-12-20 DIAGNOSIS — E1122 Type 2 diabetes mellitus with diabetic chronic kidney disease: Secondary | ICD-10-CM | POA: Diagnosis not present

## 2023-12-20 DIAGNOSIS — C184 Malignant neoplasm of transverse colon: Secondary | ICD-10-CM | POA: Diagnosis not present

## 2023-12-20 DIAGNOSIS — Z79899 Other long term (current) drug therapy: Secondary | ICD-10-CM | POA: Insufficient documentation

## 2023-12-20 DIAGNOSIS — N182 Chronic kidney disease, stage 2 (mild): Secondary | ICD-10-CM | POA: Diagnosis not present

## 2023-12-20 DIAGNOSIS — M1990 Unspecified osteoarthritis, unspecified site: Secondary | ICD-10-CM | POA: Diagnosis not present

## 2023-12-20 DIAGNOSIS — Z87891 Personal history of nicotine dependence: Secondary | ICD-10-CM | POA: Insufficient documentation

## 2023-12-20 DIAGNOSIS — G3183 Dementia with Lewy bodies: Secondary | ICD-10-CM | POA: Insufficient documentation

## 2023-12-20 DIAGNOSIS — K640 First degree hemorrhoids: Secondary | ICD-10-CM | POA: Diagnosis not present

## 2023-12-20 DIAGNOSIS — K5730 Diverticulosis of large intestine without perforation or abscess without bleeding: Secondary | ICD-10-CM | POA: Insufficient documentation

## 2023-12-20 DIAGNOSIS — Z7984 Long term (current) use of oral hypoglycemic drugs: Secondary | ICD-10-CM | POA: Diagnosis not present

## 2023-12-20 DIAGNOSIS — F02B0 Dementia in other diseases classified elsewhere, moderate, without behavioral disturbance, psychotic disturbance, mood disturbance, and anxiety: Secondary | ICD-10-CM | POA: Diagnosis not present

## 2023-12-20 DIAGNOSIS — K5669 Other partial intestinal obstruction: Secondary | ICD-10-CM | POA: Diagnosis not present

## 2023-12-20 DIAGNOSIS — D500 Iron deficiency anemia secondary to blood loss (chronic): Secondary | ICD-10-CM | POA: Diagnosis present

## 2023-12-20 DIAGNOSIS — I129 Hypertensive chronic kidney disease with stage 1 through stage 4 chronic kidney disease, or unspecified chronic kidney disease: Secondary | ICD-10-CM | POA: Insufficient documentation

## 2023-12-20 HISTORY — PX: ESOPHAGOGASTRODUODENOSCOPY (EGD) WITH PROPOFOL: SHX5813

## 2023-12-20 HISTORY — PX: SUBMUCOSAL TATTOO INJECTION: SHX6856

## 2023-12-20 HISTORY — PX: COLONOSCOPY WITH PROPOFOL: SHX5780

## 2023-12-20 HISTORY — PX: BIOPSY: SHX5522

## 2023-12-20 LAB — GLUCOSE, CAPILLARY: Glucose-Capillary: 118 mg/dL — ABNORMAL HIGH (ref 70–99)

## 2023-12-20 SURGERY — COLONOSCOPY WITH PROPOFOL
Anesthesia: General

## 2023-12-20 MED ORDER — PROPOFOL 10 MG/ML IV BOLUS
INTRAVENOUS | Status: DC | PRN
  Administered 2023-12-20: 125 ug/kg/min via INTRAVENOUS
  Administered 2023-12-20: 90 mg via INTRAVENOUS

## 2023-12-20 MED ORDER — SODIUM CHLORIDE 0.9 % IV SOLN
INTRAVENOUS | Status: DC
  Administered 2023-12-20: 20 mL/h via INTRAVENOUS

## 2023-12-20 MED ORDER — LIDOCAINE HCL (CARDIAC) PF 100 MG/5ML IV SOSY
PREFILLED_SYRINGE | INTRAVENOUS | Status: DC | PRN
  Administered 2023-12-20: 50 mg via INTRAVENOUS

## 2023-12-20 MED ORDER — LIDOCAINE HCL (PF) 2 % IJ SOLN
INTRAMUSCULAR | Status: AC
  Filled 2023-12-20: qty 5

## 2023-12-20 MED ORDER — PROPOFOL 1000 MG/100ML IV EMUL
INTRAVENOUS | Status: AC
  Filled 2023-12-20: qty 100

## 2023-12-20 NOTE — Transfer of Care (Signed)
Immediate Anesthesia Transfer of Care Note  Patient: Alex ERMIS Sr.  Procedure(s) Performed: COLONOSCOPY WITH PROPOFOL ESOPHAGOGASTRODUODENOSCOPY (EGD) WITH PROPOFOL BIOPSY SUBMUCOSAL TATTOO INJECTION  Patient Location: Endoscopy Unit  Anesthesia Type:General  Level of Consciousness: sedated and drowsy  Airway & Oxygen Therapy: Patient Spontanous Breathing  Post-op Assessment: Report given to RN and Post -op Vital signs reviewed and stable  Post vital signs: Reviewed and stable  Last Vitals:  Vitals Value Taken Time  BP 143/62 12/20/23 1002  Temp 36.4 C 12/20/23 1001  Pulse 63 12/20/23 1003  Resp 21 12/20/23 1003  SpO2 100 % 12/20/23 1003  Vitals shown include unfiled device data.  Last Pain:  Vitals:   12/20/23 1001  TempSrc: Temporal  PainSc: Asleep         Complications: No notable events documented.

## 2023-12-20 NOTE — Anesthesia Postprocedure Evaluation (Signed)
Anesthesia Post Note  Patient: DAEGEN BERROCAL Sr.  Procedure(s) Performed: COLONOSCOPY WITH PROPOFOL ESOPHAGOGASTRODUODENOSCOPY (EGD) WITH PROPOFOL BIOPSY SUBMUCOSAL TATTOO INJECTION  Patient location during evaluation: PACU Anesthesia Type: General Level of consciousness: awake Pain management: pain level controlled Vital Signs Assessment: post-procedure vital signs reviewed and stable Respiratory status: spontaneous breathing Cardiovascular status: blood pressure returned to baseline Anesthetic complications: no   No notable events documented.   Last Vitals:  Vitals:   12/20/23 1001 12/20/23 1013  BP: (!) 143/62 135/78  Pulse: (!) 52   Resp: 17   Temp: 36.4 C   SpO2: 100%     Last Pain:  Vitals:   12/20/23 1023  TempSrc:   PainSc: 0-No pain                 VAN STAVEREN,Diego Ulbricht

## 2023-12-20 NOTE — Anesthesia Procedure Notes (Signed)
Date/Time: 12/20/2023 9:24 AM  Performed by: Ginger Carne, CRNAPre-anesthesia Checklist: Patient identified, Emergency Drugs available, Suction available, Patient being monitored and Timeout performed Patient Re-evaluated:Patient Re-evaluated prior to induction Oxygen Delivery Method: Nasal cannula Preoxygenation: Pre-oxygenation with 100% oxygen Induction Type: IV induction

## 2023-12-20 NOTE — Anesthesia Preprocedure Evaluation (Signed)
Anesthesia Evaluation  Patient identified by MRN, date of birth, ID band Patient awake    Reviewed: Allergy & Precautions, NPO status , Patient's Chart, lab work & pertinent test results  Airway Mallampati: II  TM Distance: >3 FB Neck ROM: full    Dental  (+) Edentulous Upper, Edentulous Lower   Pulmonary neg pulmonary ROS, Patient abstained from smoking., former smoker   Pulmonary exam normal  + decreased breath sounds      Cardiovascular hypertension, Pt. on medications negative cardio ROS Normal cardiovascular exam Rhythm:Regular Rate:Normal     Neuro/Psych       Dementia negative neurological ROS  negative psych ROS   GI/Hepatic negative GI ROS, Neg liver ROS,GERD  Medicated,,  Endo/Other  negative endocrine ROSdiabetes, Type 2, Oral Hypoglycemic Agents    Renal/GU CRFRenal diseasenegative Renal ROS  negative genitourinary   Musculoskeletal  (+) Arthritis ,    Abdominal Normal abdominal exam  (+)   Peds negative pediatric ROS (+)  Hematology negative hematology ROS (+) Blood dyscrasia, anemia   Anesthesia Other Findings Past Medical History: No date: Anemia No date: Atherosclerosis of abdominal aorta (HCC) 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 No date: Dementia (HCC) No date: 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  Only wears upper  Past  Surgical History: 03/01/2020: AMPUTATION TOE; Right     Comment:  Procedure: AMPUTATION TOE;  Surgeon: [name],               DPM;  Location: [facility];  Service: Podiatry;                Laterality: Right; 01/03/2023: AMPUTATION TOE; Right     Comment:  Procedure: AMPUTATION TOE, 2nd right;  Surgeon: [name], DPM;  Location: [facility];  Service: Podiatry;                Laterality: Right; No date: APPENDECTOMY 01/03/2023: BONE EXCISION; Right     Comment:  Procedure: Exostectomy interphalangeal joint right great              toe;  Surgeon: [name], DPM;  Location: [facility];               Service: Podiatry;  Laterality: Right; 09/06/2021: CATARACT EXTRACTION W/PHACO; Left     Comment:  Procedure: CATARACT EXTRACTION PHACO AND INTRAOCULAR               LENS PLACEMENT (IOC) LEFT 2.71 00:30.6;  Surgeon: Nevada Crane, MD;  Location: [facility];                Service: Ophthalmology;  Laterality: Left;  Diabetic No date: EYE SURGERY     Comment:  age 44 No date: HERNIA REPAIR 06/23/2023: INSERTION OF MESH     Comment:  Procedure: INSERTION OF MESH;  Surgeon: Sung Amabile,               DO;  Location: [facility];  Service: General;; No date: KIDNEY STONE SURGERY 04/19/2021: REMOVAL RETAINED LENS; Right     Comment:  Procedure: CATARACT EXTRACTION PHACO,  RIGHT VISION BLUE              0.20 00:01.4 case aborted;  Surgeon: Nevada Crane,               MD;  Location: [facility];  Service:               Ophthalmology;  Laterality: Right;  Diabetic - oral meds 10/06/2016: SHOULDER ARTHROSCOPY WITH BICEPS TENDON REPAIR; Right     Comment:  Procedure: SHOULDER ARTHROSCOPY WITH BICEPS TENDON               REPAIR;  Surgeon: Christena Flake, MD;  Location: [facility];               Service: Orthopedics;  Laterality: Right; 10/06/2016: SHOULDER ARTHROSCOPY WITH OPEN ROTATOR CUFF REPAIR; Right     Comment:  Procedure: SHOULDER  ARTHROSCOPY WITH OPEN ROTATOR CUFF               REPAIR;  Surgeon: Christena Flake, MD;  Location: [facility];               Service: Orthopedics;  Laterality: Right; 10/06/2016: SHOULDER ARTHROSCOPY WITH SUBACROMIAL DECOMPRESSION; Right     Comment:  Procedure: SHOULDER ARTHROSCOPY WITH SUBACROMIAL               DECOMPRESSION AND DEBRIDEMENT;  Surgeon: Christena Flake,               MD;  Location: [facility];  Service: Orthopedics;                Laterality: Right; No date: TONSILLECTOMY  BMI    Body Mass Index: 20.03 kg/m      Reproductive/Obstetrics negative OB ROS                             Anesthesia Physical Anesthesia Plan  ASA: 3  Anesthesia Plan: General   Post-op Pain Management:    Induction: Intravenous  PONV Risk Score and Plan: Propofol infusion and TIVA  Airway Management Planned: Natural Airway and Nasal Cannula  Additional Equipment:   Intra-op Plan:   Post-operative Plan:   Informed Consent: I have reviewed the patients History and Physical, chart, labs and discussed the procedure including the risks, benefits and alternatives for the proposed anesthesia with the patient or authorized representative who has indicated his/her understanding and acceptance.     Dental Advisory Given  Plan Discussed with: Anesthesiologist, CRNA and Surgeon  Anesthesia Plan Comments:        Anesthesia Quick Evaluation

## 2023-12-20 NOTE — Op Note (Signed)
[facility] Gastroenterology Patient Name: Alex Soto Procedure Date: 12/20/2023 9:14 AM MRN: [id number] Account #: [id number] Date of Birth: 1946-05-08 Admit Type: Outpatient Age: 78 Room: [facility] ENDO ROOM 2 Gender: Male Note Status: Finalized Instrument Name: Prentice Docker [id number] Procedure:             Colonoscopy Indications:           Iron deficiency anemia secondary to chronic blood                         loss, Abnormal CT of the GI tract Providers:             Boykin Nearing. Norma Fredrickson MD, MD Referring MD:          Boykin Nearing. Norma Fredrickson MD, MD (Referring MD), Marya Amsler.                         Dareen Piano MD, MD (Referring MD) Medicines:             Propofol per Anesthesia Complications:         No immediate complications. Estimated blood loss:                         Minimal. Procedure:             Pre-Anesthesia Assessment:                        - The risks and benefits of the procedure and the                         sedation options and risks were discussed with the                         patient. All questions were answered and informed                         consent was obtained.                        - Patient identification and proposed procedure were                         verified prior to the procedure by the nurse. The                         procedure was verified in the procedure room.                        - ASA Grade Assessment: III - A patient with severe                         systemic disease.                        - After reviewing the risks and benefits, the patient                         was deemed in satisfactory condition to undergo the  procedure.                        After obtaining informed consent, the colonoscope was                         passed under direct vision. Throughout the procedure,                         the patient's blood pressure, pulse, and oxygen                         saturations were  monitored continuously. The                         Colonoscope was introduced through the anus with the                         intention of advancing to the cecum. The scope was                         advanced to the transverse colon before the procedure                         was aborted. Medications were given. The colonoscopy                         was performed with difficulty due to a partially                         obstructing mass. The patient tolerated the procedure                         well. The quality of the bowel preparation was good.                         Hepatic flexure were photographed. Findings:      The perianal and digital rectal examinations were normal. Pertinent       negatives include normal sphincter tone and no palpable rectal lesions.      Non-bleeding internal hemorrhoids were found during retroflexion. The       hemorrhoids were Grade I (internal hemorrhoids that do not prolapse).      Many medium-mouthed and small-mouthed diverticula were found in the       sigmoid colon, descending colon, splenic flexure and transverse colon.      A frond-like/villous, infiltrative and polypoid partially obstructing       large mass was found in the proximal transverse colon. The mass was       circumferential. The mass measured Unknown cm in length. Friable.       Biopsies were taken with a cold forceps for histology. Area was tattooed       with an injection of 5 mL of Spot (carbon black).      Attempts to traverse the mass were unsuccessful. Only the distal aspect       of the mass could be tattooed in 4 quadrants.      The scope was slowly withdrawn and the procedure terminated. Estimated       blood loss was minimal. Impression:            -  Non-bleeding internal hemorrhoids.                        - Diverticulosis in the sigmoid colon, in the                         descending colon, at the splenic flexure and in the                         transverse  colon.                        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 Biopsied. Tattooed.                        - The examination was suspicious for a                         malignant-appearing tumor. Biopsied. Tattooed. Recommendation:        - Patient has a contact number available for                         emergencies. The signs and symptoms of potential                         delayed complications were discussed with the patient.                         Return to normal activities tomorrow. Written                         discharge instructions were provided to the patient.                        - Resume previous diet.                        - Continue present medications.                        - Await pathology results.                        - If the pathology report is malignant, then refer to                         an oncologist at appointment to be scheduled.                        - Return to GI office in 1 week.                        - Telephone GI office to schedule appointment.                        - The findings and recommendations were discussed with  the patient and their spouse. Procedure Code(s):     --- Professional ---                        [phone], 52, Colonoscopy, flexible; with biopsy, single                         or multiple                        [id number], 52, Colonoscopy, flexible; with directed                         submucosal injection(s), any substance Diagnosis Code(s):     --- Professional ---                        R93.3, Abnormal findings on diagnostic imaging of                         other parts of digestive tract                        K57.30, Diverticulosis of large intestine without                         perforation or abscess without bleeding                        D50.0, Iron deficiency anemia secondary to blood loss                          (chronic)                        K56.690, Other partial intestinal obstruction                        D49.0, Neoplasm of unspecified behavior of digestive                         system                        K64.0, First degree hemorrhoids CPT copyright 2022 American Medical Association. All rights reserved. The codes documented in this report are preliminary and upon coder review may  be revised to meet current compliance requirements. Stanton Kidney MD, MD 12/20/2023 10:13:31 AM This report has been signed electronically. Number of Addenda: 0 Note Initiated On: 12/20/2023 9:14 AM Total Procedure Duration: 0 hours 20 minutes 48 seconds  Estimated Blood Loss:  Estimated blood loss was minimal.      [facility]

## 2023-12-20 NOTE — Op Note (Signed)
[facility] Gastroenterology Patient Name: Alex Soto Procedure Date: 12/20/2023 9:15 AM MRN: [id number] Account #: [id number] Date of Birth: [birth year] Admit Type: Outpatient Age: 78 Room: [facility] ENDO ROOM 2 Gender: Male Note Status: Finalized Instrument Name: Upper Endoscope [id number] Procedure:             Upper GI endoscopy Indications:           Unexplained iron deficiency anemia Providers:             Boykin Nearing. Norma Fredrickson MD, MD Referring MD:          Boykin Nearing. Norma Fredrickson MD, MD (Referring MD), Marya Amsler.                         Dareen Piano MD, MD (Referring MD) Medicines:             Propofol per Anesthesia Complications:         No immediate complications. Estimated blood loss: None. Procedure:             Pre-Anesthesia Assessment:                        - The risks and benefits of the procedure and the                         sedation options and risks were discussed with the                         patient. All questions were answered and informed                         consent was obtained.                        - Patient identification and proposed procedure were                         verified prior to the procedure by the nurse. The                         procedure was verified in the procedure room.                        - ASA Grade Assessment: III - A patient with severe                         systemic disease.                        - After reviewing the risks and benefits, the patient                         was deemed in satisfactory condition to undergo the                         procedure.                        After obtaining informed consent, the endoscope was  passed under direct vision. Throughout the procedure,                         the patient's blood pressure, pulse, and oxygen                         saturations were monitored continuously. The Endoscope                         was introduced through the  mouth, and advanced to the                         third part of duodenum. The upper GI endoscopy was                         accomplished without difficulty. The patient tolerated                         the procedure well. Findings:      The esophagus was normal.      Patchy minimal inflammation characterized by erythema was found in the       entire examined stomach.      The exam of the stomach was otherwise normal.      The examined duodenum was normal. Impression:            - Normal esophagus.                        - Gastritis.                        - Normal examined duodenum.                        - No specimens collected. Recommendation:        - Proceed with colonoscopy Procedure Code(s):     --- Professional ---                        [phone], Esophagogastroduodenoscopy, flexible,                         transoral; diagnostic, including collection of                         specimen(s) by brushing or washing, when performed                         (separate procedure) Diagnosis Code(s):     --- Professional ---                        D50.9, Iron deficiency anemia, unspecified                        K29.70, Gastritis, unspecified, without bleeding CPT copyright 2022 American Medical Association. All rights reserved. The codes documented in this report are preliminary and upon coder review may  be revised to meet current compliance requirements. Stanton Kidney MD, MD 12/20/2023 9:33:25 AM This report has been signed electronically. Number of Addenda: 0 Note Initiated On: 12/20/2023 9:15 AM Estimated Blood Loss:  Estimated  blood loss: none.      [facility]

## 2023-12-20 NOTE — H&P (Signed)
Outpatient short stay form Pre-procedure 12/20/2023 9:14 AM Teva Bronkema K. Norma Fredrickson, M.D.  Primary Physician: Einar Crow, MD  Reason for visit: Iron deficiency anemia, colon cancer screening.  History of present illness: Shomari Scicchitano is a 78 year old male with a history of iron deficiency anemia, unknown etiology.  Patient is also due for screening colonoscopy.  He denies any abdominal pain change in bowel habits or rectal bleeding. Mr. Bundren presents to the [facility] GI clinic for chief complaint of iron-deficiency anemia at the request of Dr. Einar Crow. He presents to the clinic with his wife. He was seen in clinic by myself July 2022 for routine-risk colon cancer screening and scheduled for colonoscopy, but then patient called to cancel procedure because he didn't see the need in having one. He is colonoscopy naive. Labs in January of this year showed iron-deficiency anemia with hemoglobin 8.1, MCV 87, ferritin 17, serum iron 22, TIBC 255, and iron sat 9%. He was started on oral iron supplement twice daily. Repeat labs in February 2024 showed hemoglobin 8.8. Repeat hemoglobin in May 2024 showed hemoglobin 7.0 and MCV 82. He was advised to seek out opinion of GI and be referred to Hematology for IV iron infusions. He never went to Hematology. He has continued to take oral iron once daily over the past 3 months. Iron levels have not been rechecked by PCP. Hemoglobin in June 2023 was 12.3. He denies any overt gastrointestinal blood loss in the form of hematochezia or melena. He denies any hematuria, epistaxis, or coffee ground emesis. He denies any change in his bowel habits. Bowels are moving regularly without any issues with diarrhea or constipation. He denies any loss of appetite or unintentional weight loss. He denies any frequent GERD symptoms. He has mild pill dysphagia, but no dysphagia to solids or liquids. He denies any nausea, vomiting, odynophagia, early satiety, hoarseness, or epigastric  abdominal pain. He is s/p robotic-assisted inguinal hernia repair 06/23/23 with Dr. Tonna Boehringer. He had toe amputation earlier this year. He follows in Neurology with Dr. Sherryll Burger for hx of Lewy body dementia.     Current Facility-Administered Medications:    0.9 %  sodium chloride infusion, , Intravenous, Continuous, [place], Boykin Nearing, MD, Last Rate: 20 mL/hr at 12/20/23 0833, 20 mL/hr at 12/20/23 4098  Medications Prior to Admission  Medication Sig Dispense Refill Last Dose/Taking   aspirin 81 MG tablet Take 81 mg by mouth daily.   12/19/2023   atorvastatin (LIPITOR) 40 MG tablet Take 40 mg by mouth 2 (two) times daily.   12/19/2023   Cholecalciferol (VITAMIN D3) 50 MCG (2000 UT) TABS Take 1 tablet by mouth daily.   12/19/2023   cyanocobalamin (VITAMIN B12) 1000 MCG tablet Take 1,000 mcg by mouth daily.   12/19/2023   ferrous sulfate 325 (65 FE) MG tablet Take 325 mg by mouth 2 (two) times daily with a meal.   12/19/2023   glipiZIDE (GLUCOTROL XL) 5 MG 24 hr tablet Take 5 mg by mouth daily with breakfast.   12/19/2023   HYDROcodone-acetaminophen (NORCO) 5-325 MG tablet Take 1 tablet by mouth every 6 (six) hours as needed for up to 6 doses for moderate pain. 6 tablet 0 12/19/2023   pioglitazone (ACTOS) 15 MG tablet Take 15 mg by mouth daily.   12/19/2023   rivastigmine (EXELON) 3 MG capsule Take 3 mg by mouth 2 (two) times daily.   12/19/2023   metFORMIN (GLUCOPHAGE) 500 MG tablet Take 500 mg by mouth 2 (two) times daily with a  meal.        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  Only wears upper    Review of systems:  Otherwise negative.    Physical Exam  Gen: Alert, oriented. Appears stated age.  HEENT: [place]/AT. PERRLA. Lungs: CTA, no wheezes. CV: RR nl S1, S2. Abd: soft, benign, no masses. BS+ Ext: No edema. Pulses 2+    Planned procedures: Proceed with EGD and colonoscopy. The patient understands the nature of the planned procedure, indications, risks, alternatives and potential complications including but not limited to bleeding, infection, perforation, damage to internal organs and possible oversedation/side effects from anesthesia. The patient agrees and gives consent to proceed.  Please refer to procedure notes for findings, recommendations and patient disposition/instructions.     Natanael Saladin K. Norma Fredrickson, M.D. Gastroenterology 12/20/2023  9:14 AM

## 2023-12-21 ENCOUNTER — Encounter: Payer: Self-pay | Admitting: Internal Medicine

## 2023-12-22 LAB — SURGICAL PATHOLOGY

## 2023-12-27 ENCOUNTER — Inpatient Hospital Stay: Payer: Medicare PPO

## 2023-12-27 ENCOUNTER — Encounter: Payer: Self-pay | Admitting: Internal Medicine

## 2023-12-27 ENCOUNTER — Inpatient Hospital Stay: Payer: Medicare PPO | Attending: Internal Medicine | Admitting: Internal Medicine

## 2023-12-27 VITALS — BP 152/70 | HR 90 | Temp 98.8°F | Resp 18 | Ht 71.5 in | Wt 156.0 lb

## 2023-12-27 DIAGNOSIS — D649 Anemia, unspecified: Secondary | ICD-10-CM | POA: Diagnosis not present

## 2023-12-27 DIAGNOSIS — C184 Malignant neoplasm of transverse colon: Secondary | ICD-10-CM

## 2023-12-27 LAB — CBC WITH DIFFERENTIAL (CANCER CENTER ONLY)
Abs Immature Granulocytes: 0.01 10*3/uL (ref 0.00–0.07)
Basophils Absolute: 0 10*3/uL (ref 0.0–0.1)
Basophils Relative: 0 %
Eosinophils Absolute: 0 10*3/uL (ref 0.0–0.5)
Eosinophils Relative: 0 %
HCT: 33.6 % — ABNORMAL LOW (ref 39.0–52.0)
Hemoglobin: 10.8 g/dL — ABNORMAL LOW (ref 13.0–17.0)
Immature Granulocytes: 0 %
Lymphocytes Relative: 9 %
Lymphs Abs: 0.4 10*3/uL — ABNORMAL LOW (ref 0.7–4.0)
MCH: 27.6 pg (ref 26.0–34.0)
MCHC: 32.1 g/dL (ref 30.0–36.0)
MCV: 85.7 fL (ref 80.0–100.0)
Monocytes Absolute: 0.6 10*3/uL (ref 0.1–1.0)
Monocytes Relative: 12 %
Neutro Abs: 4 10*3/uL (ref 1.7–7.7)
Neutrophils Relative %: 79 %
Platelet Count: 271 10*3/uL (ref 150–400)
RBC: 3.92 MIL/uL — ABNORMAL LOW (ref 4.22–5.81)
RDW: 15.2 % (ref 11.5–15.5)
WBC Count: 5.1 10*3/uL (ref 4.0–10.5)
nRBC: 0 % (ref 0.0–0.2)

## 2023-12-27 LAB — CMP (CANCER CENTER ONLY)
ALT: 17 U/L (ref 0–44)
AST: 19 U/L (ref 15–41)
Albumin: 4 g/dL (ref 3.5–5.0)
Alkaline Phosphatase: 93 U/L (ref 38–126)
Anion gap: 12 (ref 5–15)
BUN: 21 mg/dL (ref 8–23)
CO2: 26 mmol/L (ref 22–32)
Calcium: 9.2 mg/dL (ref 8.9–10.3)
Chloride: 94 mmol/L — ABNORMAL LOW (ref 98–111)
Creatinine: 0.79 mg/dL (ref 0.61–1.24)
GFR, Estimated: >60 mL/min (ref >60)
Glucose, Bld: 190 mg/dL — ABNORMAL HIGH (ref 70–99)
Potassium: 3.9 mmol/L (ref 3.5–5.1)
Sodium: 132 mmol/L — ABNORMAL LOW (ref 135–145)
Total Bilirubin: 1 mg/dL (ref 0.0–1.2)
Total Protein: 7.3 g/dL (ref 6.5–8.1)

## 2023-12-27 LAB — FERRITIN: Ferritin: 10 ng/mL — ABNORMAL LOW (ref 24–336)

## 2023-12-27 LAB — IRON AND TIBC
Iron: 48 ug/dL (ref 45–182)
Saturation Ratios: 17 % — ABNORMAL LOW (ref 17.9–39.5)
TIBC: 277 ug/dL (ref 250–450)
UIBC: 229 ug/dL

## 2023-12-27 NOTE — Progress Notes (Signed)
 Weight loss, pale and abd pain. Hernia operation Nov 2024 with mesh. Has had pain since then. Eats twice a day,Occ bouts of nausea. He had vomiting last night. Some burping and belching. Has low energy with some unsteadiness on feet. Had colonoscopy which showed colon cancer. Stools normal. No blood. He just sits and hold his stomach. He rates his pain a 7/10 on pain scale. I can feel a small knot on right abdomen. Pt has known dementia. Very alert and kind.

## 2023-12-27 NOTE — Assessment & Plan Note (Addendum)
#   CT-  Findings consistent with colitis in the cecum and ascending segment, whether inflammatory, infectious or ischemic. No wall pneumatosis or portal venous gas is seen. Mild dilatation of the terminal ileal segments up to 2.8 cm. No other small bowel dilatation.  # I do long discussed with the patient and family regarding the concern for malignancy based on imaging/colonoscopy biopsy recommend further workup including PET scan.   # Had a long discussion with patient and family regarding the treatment of treatment of colon cancer depends upon the stage.  If patient noted to have early stage cancer would recommend surgery-again based on comorbidities etc.  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  Recommend IV iron  infusions. Discussed the potential acute infusion reactions with IV iron ; which are quite rare.  Patient understands the risk; will proceed with infusions.    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  Thank you. for allowing me to participate in the care of your pleasant patient. Please do not hesitate to contact me with questions or concerns in the interim.  # DISPOSITION: # labs- cbc/cmp; Iron  studies; ferritin- CEA- please order today #   venofer  twice weekly x 4 [over next 2 weeks- Start ASAP] # PET scan ASAP # Follow up TBD- Dr.B  # 60 minutes face-to-face with the patient discussing the above plan of care; more than 50% of time spent on prognosis/ natural history; counseling and coordination.

## 2023-12-27 NOTE — Progress Notes (Signed)
 [facility] OFFICE PROGRESS NOTE  Patient Care Team: Jimmy Moulding, MD as PCP - General (Internal Medicine) Rochell Chroman, RN as Oncology Nurse Navigator Gwyn Leos, MD as Consulting Physician (Oncology)   Cancer Staging  No matching staging information was found for the patient.    Oncology History   No history exists.      INTERVAL HISTORY:   Alex FEUTZ Sr. 78 y.o.  male pleasant patient with multiple medical problems including history of iron  deficient anemia, history of liver dementia has been referred to us  for further evaluation recommendations for newly diagnosed colon cancer.  Patient was evaluated by GI for worsening iron  deficiency anemia/worsening abdominal pain..  Patient had previously declined colonoscopy July 2022.   Patient subsequently underwent colonoscopy that showed adenocarcinoma of the colon.  Patient also subsequently a CT scan that showed mass in the colon.  And given his worsening iron  deficiency anemia referred to oncology for further evaluation for iron  infusions and also for positive malignancy.  Review of Systems  Constitutional:  Positive for malaise/fatigue and weight loss. Negative for chills, diaphoresis and fever.  HENT:  Negative for nosebleeds and sore throat.   Eyes:  Negative for double vision.  Respiratory:  Negative for cough, hemoptysis, sputum production, shortness of breath and wheezing.   Cardiovascular:  Negative for chest pain, palpitations, orthopnea and leg swelling.  Gastrointestinal:  Positive for abdominal pain, nausea and vomiting. Negative for blood in stool, constipation, diarrhea, heartburn and melena.  Genitourinary:  Negative for dysuria, frequency and urgency.  Musculoskeletal:  Negative for back pain and joint pain.  Skin: Negative.  Negative for itching and rash.  Neurological:  Negative for dizziness, tingling, focal weakness, weakness and headaches.  Endo/Heme/Allergies:  Does  not bruise/bleed easily.  Psychiatric/Behavioral:  Negative for depression. The patient is not nervous/anxious and does not have insomnia.       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  Only wears upper    PAST SURGICAL HISTORY :   Past Surgical History:  Procedure Laterality Date   AMPUTATION TOE Right 03/01/2020   Procedure: AMPUTATION TOE;  Surgeon: Anell Baptist, DPM;  Location: [facility];  Service: Podiatry;  Laterality: Right;   AMPUTATION TOE Right 01/03/2023   Procedure: AMPUTATION TOE, 2nd right;  Surgeon: Anell Baptist, DPM;  Location: [facility];  Service: Podiatry;  Laterality: Right;   APPENDECTOMY     BIOPSY  12/20/2023   Procedure: BIOPSY;  Surgeon: Corky Diener, Alphonsus Jeans, MD;  Location: [facility];  Service: Gastroenterology;;   BONE EXCISION Right 01/03/2023   Procedure: Exostectomy interphalangeal joint right great toe;  Surgeon: Anell Baptist, DPM;  Location: [facility];  Service:  Podiatry;  Laterality: Right;   BOWEL RESECTION N/A 01/04/2024   Procedure: RESECTION OF ANASTOMOSIS, TAKE DOWN SPLENIC FLEXTURE;  Surgeon: Barrett Lick, MD;  Location: [facility];  Service: General;  Laterality: N/A;   CATARACT EXTRACTION W/PHACO Left 09/06/2021   Procedure: CATARACT EXTRACTION PHACO AND INTRAOCULAR LENS  PLACEMENT (IOC) LEFT 2.71 00:30.6;  Surgeon: Rosa College, MD;  Location: [facility];  Service: Ophthalmology;  Laterality: Left;  Diabetic   COLONOSCOPY WITH PROPOFOL  N/A 12/20/2023   Procedure: COLONOSCOPY WITH PROPOFOL ;  Surgeon: Toledo, Alphonsus Jeans, MD;  Location: [facility] ENDOSCOPY;  Service: Gastroenterology;  Laterality: N/A;   COLOSTOMY N/A 01/04/2024   Procedure: CREATION, END ILEOSTOMY AND MUCOUS FISTULA;  Surgeon: Barrett Lick, MD;  Location: [facility];  Service: General;  Laterality: N/A;   ESOPHAGOGASTRODUODENOSCOPY (EGD) WITH PROPOFOL  N/A 12/20/2023   Procedure: ESOPHAGOGASTRODUODENOSCOPY (EGD) WITH PROPOFOL ;  Surgeon: Toledo, Alphonsus Jeans, MD;  Location: [facility] ENDOSCOPY;  Service: Gastroenterology;  Laterality: N/A;   EYE SURGERY     age 86   HERNIA REPAIR     INSERTION OF MESH  06/23/2023   Procedure: INSERTION OF MESH;  Surgeon: Conrado Delay, DO;  Location: [facility];  Service: General;;   KIDNEY STONE SURGERY     LAPAROTOMY Right 12/31/2023   Procedure: EXPLORATORY LAPAROTOMY WITH RIGHT COLECTOMY;  Surgeon: Barrett Lick, MD;  Location: [facility];  Service: General;  Laterality: Right;   LAPAROTOMY N/A 01/04/2024   Procedure: LAPAROTOMY, EXPLORATORY;  Surgeon: Barrett Lick, MD;  Location: [facility];  Service: General;  Laterality: N/A;   REMOVAL RETAINED LENS Right 04/19/2021   Procedure: CATARACT EXTRACTION PHACO,  RIGHT VISION BLUE 0.20 00:01.4 case aborted;  Surgeon: Rosa College, MD;  Location: [facility];  Service: Ophthalmology;  Laterality: Right;  Diabetic - oral meds   SHOULDER ARTHROSCOPY WITH BICEPS TENDON REPAIR Right 10/06/2016   Procedure: SHOULDER ARTHROSCOPY WITH BICEPS TENDON REPAIR;  Surgeon: Elner Hahn, MD;  Location: [facility];  Service: Orthopedics;  Laterality: Right;   SHOULDER ARTHROSCOPY WITH OPEN ROTATOR CUFF REPAIR Right 10/06/2016   Procedure: SHOULDER ARTHROSCOPY WITH OPEN ROTATOR CUFF REPAIR;  Surgeon: Elner Hahn, MD;   Location: [facility];  Service: Orthopedics;  Laterality: Right;   SHOULDER ARTHROSCOPY WITH SUBACROMIAL DECOMPRESSION Right 10/06/2016   Procedure: SHOULDER ARTHROSCOPY WITH SUBACROMIAL DECOMPRESSION AND DEBRIDEMENT;  Surgeon: Elner Hahn, MD;  Location: [facility];  Service: Orthopedics;  Laterality: Right;   SUBMUCOSAL TATTOO INJECTION  12/20/2023   Procedure: SUBMUCOSAL TATTOO INJECTION;  Surgeon: Toledo, Alphonsus Jeans, MD;  Location: [facility] ENDOSCOPY;  Service: Gastroenterology;;   TONSILLECTOMY      FAMILY HISTORY :   Family History  Family history unknown: Yes    SOCIAL HISTORY:   Social History   Tobacco Use   Smoking status: Former    Current packs/day: 0.00    Average packs/day: 0.3 packs/day for 10.0 years (2.5 ttl pk-yrs)    Types: Cigarettes    Start date: 10/01/1959    Quit date: 09/30/1969    Years since quitting: 54.5    Passive exposure: Past   Smokeless tobacco: Former  Building services engineer status: Never Used  Substance Use Topics   Alcohol  use: No   Drug use: No    ALLERGIES:  has no known allergies.  MEDICATIONS:  Current Outpatient Medications  Medication Sig Dispense Refill   aspirin  81 MG tablet Take 81 mg by mouth daily.     Cholecalciferol (VITAMIN  D3) 50 MCG (2000 UT) TABS Take 1 tablet by mouth daily.     cyanocobalamin (VITAMIN B12) 1000 MCG tablet Take 1,000 mcg by mouth daily.     ferrous sulfate 325 (65 FE) MG tablet Take 325 mg by mouth 2 (two) times daily with a meal.     metFORMIN (GLUCOPHAGE) 500 MG tablet Take 500 mg by mouth 2 (two) times daily with a meal.     pioglitazone (ACTOS) 15 MG tablet Take 15 mg by mouth daily.     rivastigmine  (EXELON ) 3 MG capsule Take 3 mg by mouth 2 (two) times daily.     atorvastatin  (LIPITOR) 80 MG tablet Take 80 mg by mouth daily.     glipiZIDE (GLUCOTROL) 5 MG tablet Take 5 mg by mouth daily.     losartan-hydrochlorothiazide (HYZAAR) 100-12.5 MG tablet Take 1 tablet by mouth daily.     No current  facility-administered medications for this visit.    PHYSICAL EXAMINATION:   BP (!) 152/70 (BP Location: Left Arm, Patient Position: Sitting)   Pulse 90   Temp 98.8 F (37.1 C) (Tympanic)   Resp 18   Ht 5' 11.5" (1.816 m)   Wt 156 lb (70.8 kg)   SpO2 99%   BMI 21.45 kg/m   Filed Weights   12/27/23 1415  Weight: 156 lb (70.8 kg)    Physical Exam Vitals and nursing note reviewed.  HENT:     Head: Normocephalic and atraumatic.     Mouth/Throat:     Pharynx: Oropharynx is clear.  Eyes:     Extraocular Movements: Extraocular movements intact.     Pupils: Pupils are equal, round, and reactive to light.  Cardiovascular:     Rate and Rhythm: Normal rate and regular rhythm.  Pulmonary:     Comments: Decreased breath sounds bilaterally.  Abdominal:     Palpations: Abdomen is soft.  Musculoskeletal:        General: Normal range of motion.     Cervical back: Normal range of motion.  Skin:    General: Skin is warm.  Neurological:     General: No focal deficit present.     Mental Status: He is alert and oriented to person, place, and time.  Psychiatric:        Behavior: Behavior normal.        Judgment: Judgment normal.        LABORATORY DATA:  I have reviewed the data as listed    Component Value Date/Time   NA 133 (L) 01/10/2024 0454   NA 136 04/09/2014 2108   K 3.6 01/10/2024 0454   K 3.9 04/09/2014 2108   CL 103 01/10/2024 0454   CL 103 04/09/2014 2108   CO2 21 (L) 01/10/2024 0454   CO2 26 04/09/2014 2108   GLUCOSE 222 (H) 01/10/2024 0454   GLUCOSE 185 (H) 04/09/2014 2108   BUN 34 (H) 01/10/2024 0454   BUN 28 (H) 04/09/2014 2108   CREATININE 0.75 01/10/2024 0454   CREATININE 0.79 12/27/2023 1515   CREATININE 1.46 (H) 04/09/2014 2108   CALCIUM  7.6 (L) 01/10/2024 0454   CALCIUM  8.9 04/09/2014 2108   PROT 4.2 (L) 01/08/2024 0312   PROT 8.1 04/08/2014 1259   ALBUMIN  1.6 (L) 01/10/2024 0454   ALBUMIN  4.1 04/08/2014 1259   AST 33 01/08/2024 0312   AST  19 12/27/2023 1515   ALT 22 01/08/2024 0312   ALT 17 12/27/2023 1515   ALT 21 04/08/2014 1259   ALKPHOS 143 (  H) 01/08/2024 0312   ALKPHOS 80 04/08/2014 1259   BILITOT 0.5 01/08/2024 0312   BILITOT 1.0 12/27/2023 1515   GFRNONAA >60 01/10/2024 0454   GFRNONAA >60 12/27/2023 1515   GFRNONAA 49 (L) 04/09/2014 2108   GFRAA >60 03/02/2020 0344   GFRAA 57 (L) 04/09/2014 2108    No results found for: "SPEP", "UPEP"  Lab Results  Component Value Date   WBC 7.6 01/10/2024   NEUTROABS 20.1 (H) 01/04/2024   HGB 8.4 (L) 01/10/2024   HCT 24.8 (L) 01/10/2024   MCV 82.7 01/10/2024   PLT 142 (L) 01/10/2024      Chemistry      Component Value Date/Time   NA 133 (L) 01/10/2024 0454   NA 136 04/09/2014 2108   K 3.6 01/10/2024 0454   K 3.9 04/09/2014 2108   CL 103 01/10/2024 0454   CL 103 04/09/2014 2108   CO2 21 (L) 01/10/2024 0454   CO2 26 04/09/2014 2108   BUN 34 (H) 01/10/2024 0454   BUN 28 (H) 04/09/2014 2108   CREATININE 0.75 01/10/2024 0454   CREATININE 0.79 12/27/2023 1515   CREATININE 1.46 (H) 04/09/2014 2108      Component Value Date/Time   CALCIUM  7.6 (L) 01/10/2024 0454   CALCIUM  8.9 04/09/2014 2108   ALKPHOS 143 (H) 01/08/2024 0312   ALKPHOS 80 04/08/2014 1259   AST 33 01/08/2024 0312   AST 19 12/27/2023 1515   ALT 22 01/08/2024 0312   ALT 17 12/27/2023 1515   ALT 21 04/08/2014 1259   BILITOT 0.5 01/08/2024 0312   BILITOT 1.0 12/27/2023 1515       RADIOGRAPHIC STUDIES: I have personally reviewed the radiological images as listed and agreed with the findings in the report. No results found.    ASSESSMENT & PLAN:  Malignant neoplasm of transverse colon (HCC) # CT-  Findings consistent with colitis in the cecum and ascending segment, whether inflammatory, infectious or ischemic. No wall pneumatosis or portal venous gas is seen. Mild dilatation of the terminal ileal segments up to 2.8 cm. No other small bowel dilatation.  # I do long discussed with the  patient and family regarding the concern for malignancy based on imaging/colonoscopy biopsy recommend further workup including PET scan.   # Had a long discussion with patient and family regarding the treatment of treatment of colon cancer depends upon the stage.  If patient noted to have early stage cancer would recommend surgery-again based on comorbidities etc.  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  Recommend IV iron  infusions. Discussed the potential acute infusion reactions with IV iron ; which are quite rare.  Patient understands the risk; will proceed with infusions.    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  Thank you. for allowing me to participate in the care of your pleasant patient. Please do not hesitate to contact me with questions or concerns in the interim.  # DISPOSITION: # labs- cbc/cmp; Iron  studies; ferritin- CEA- please order today #   venofer  twice weekly x 4 [over next 2 weeks- Start ASAP] # PET scan ASAP # Follow up TBD- Dr.B  # 60 minutes face-to-face with the patient discussing the above plan of care; more than 50% of time spent on prognosis/ natural history; counseling and coordination.    Orders Placed This Encounter  Procedures   CBC with Differential (Cancer Center Only)    Standing Status:   Future    Number of  Occurrences:   1    Expected Date:   12/27/2023    Expiration Date:   12/26/2024   CMP (Cancer Center only)    Standing Status:   Future    Number of Occurrences:   1    Expected Date:   12/27/2023    Expiration Date:   12/26/2024   Iron  and TIBC    Standing Status:   Future    Number of Occurrences:   1    Expected Date:   12/27/2023    Expiration Date:   12/26/2024   Ferritin    Standing Status:   Future    Number of Occurrences:   1    Expected Date:   12/27/2023    Expiration Date:   12/26/2024   CEA    Standing Status:   Future    Number of Occurrences:   1     Expected Date:   12/27/2023    Expiration Date:   12/26/2024   All questions were answered. The patient knows to call the clinic with any problems, questions or concerns.      Gwyn Leos, MD 04/02/2024 1:22 PM

## 2023-12-28 LAB — CEA: CEA: 13.7 ng/mL — ABNORMAL HIGH (ref 0.0–4.7)

## 2023-12-29 ENCOUNTER — Inpatient Hospital Stay
Admission: EM | Admit: 2023-12-29 | Discharge: 2024-01-30 | DRG: 329 | Disposition: E | Payer: Medicare PPO | Source: Ambulatory Visit | Attending: Internal Medicine | Admitting: Internal Medicine

## 2023-12-29 ENCOUNTER — Telehealth: Payer: Self-pay | Admitting: Internal Medicine

## 2023-12-29 ENCOUNTER — Emergency Department: Payer: Medicare PPO

## 2023-12-29 ENCOUNTER — Other Ambulatory Visit: Payer: Self-pay

## 2023-12-29 ENCOUNTER — Inpatient Hospital Stay: Payer: Medicare PPO

## 2023-12-29 VITALS — BP 135/76 | HR 93 | Temp 97.4°F | Resp 16

## 2023-12-29 DIAGNOSIS — Z66 Do not resuscitate: Secondary | ICD-10-CM | POA: Diagnosis present

## 2023-12-29 DIAGNOSIS — D62 Acute posthemorrhagic anemia: Secondary | ICD-10-CM | POA: Diagnosis not present

## 2023-12-29 DIAGNOSIS — Z79899 Other long term (current) drug therapy: Secondary | ICD-10-CM

## 2023-12-29 DIAGNOSIS — E1122 Type 2 diabetes mellitus with diabetic chronic kidney disease: Secondary | ICD-10-CM | POA: Diagnosis present

## 2023-12-29 DIAGNOSIS — Z681 Body mass index (BMI) 19 or less, adult: Secondary | ICD-10-CM | POA: Diagnosis not present

## 2023-12-29 DIAGNOSIS — C182 Malignant neoplasm of ascending colon: Secondary | ICD-10-CM | POA: Diagnosis present

## 2023-12-29 DIAGNOSIS — E871 Hypo-osmolality and hyponatremia: Secondary | ICD-10-CM | POA: Diagnosis present

## 2023-12-29 DIAGNOSIS — I2489 Other forms of acute ischemic heart disease: Secondary | ICD-10-CM | POA: Diagnosis present

## 2023-12-29 DIAGNOSIS — G928 Other toxic encephalopathy: Secondary | ICD-10-CM | POA: Diagnosis not present

## 2023-12-29 DIAGNOSIS — C184 Malignant neoplasm of transverse colon: Secondary | ICD-10-CM | POA: Diagnosis present

## 2023-12-29 DIAGNOSIS — Z87891 Personal history of nicotine dependence: Secondary | ICD-10-CM

## 2023-12-29 DIAGNOSIS — R6521 Severe sepsis with septic shock: Secondary | ICD-10-CM | POA: Diagnosis not present

## 2023-12-29 DIAGNOSIS — N182 Chronic kidney disease, stage 2 (mild): Secondary | ICD-10-CM | POA: Diagnosis not present

## 2023-12-29 DIAGNOSIS — C189 Malignant neoplasm of colon, unspecified: Secondary | ICD-10-CM

## 2023-12-29 DIAGNOSIS — I480 Paroxysmal atrial fibrillation: Secondary | ICD-10-CM | POA: Diagnosis not present

## 2023-12-29 DIAGNOSIS — Z9911 Dependence on respirator [ventilator] status: Secondary | ICD-10-CM

## 2023-12-29 DIAGNOSIS — K631 Perforation of intestine (nontraumatic): Secondary | ICD-10-CM | POA: Diagnosis present

## 2023-12-29 DIAGNOSIS — K219 Gastro-esophageal reflux disease without esophagitis: Secondary | ICD-10-CM | POA: Diagnosis present

## 2023-12-29 DIAGNOSIS — J9601 Acute respiratory failure with hypoxia: Secondary | ICD-10-CM | POA: Diagnosis not present

## 2023-12-29 DIAGNOSIS — K9189 Other postprocedural complications and disorders of digestive system: Secondary | ICD-10-CM | POA: Diagnosis not present

## 2023-12-29 DIAGNOSIS — K56699 Other intestinal obstruction unspecified as to partial versus complete obstruction: Secondary | ICD-10-CM | POA: Diagnosis present

## 2023-12-29 DIAGNOSIS — K651 Peritoneal abscess: Secondary | ICD-10-CM | POA: Diagnosis present

## 2023-12-29 DIAGNOSIS — I959 Hypotension, unspecified: Secondary | ICD-10-CM | POA: Insufficient documentation

## 2023-12-29 DIAGNOSIS — C183 Malignant neoplasm of hepatic flexure: Secondary | ICD-10-CM | POA: Diagnosis present

## 2023-12-29 DIAGNOSIS — Z515 Encounter for palliative care: Secondary | ICD-10-CM | POA: Diagnosis not present

## 2023-12-29 DIAGNOSIS — Y838 Other surgical procedures as the cause of abnormal reaction of the patient, or of later complication, without mention of misadventure at the time of the procedure: Secondary | ICD-10-CM | POA: Diagnosis not present

## 2023-12-29 DIAGNOSIS — N179 Acute kidney failure, unspecified: Secondary | ICD-10-CM | POA: Diagnosis present

## 2023-12-29 DIAGNOSIS — Z794 Long term (current) use of insulin: Secondary | ICD-10-CM

## 2023-12-29 DIAGNOSIS — E872 Acidosis, unspecified: Secondary | ICD-10-CM | POA: Diagnosis not present

## 2023-12-29 DIAGNOSIS — Z89421 Acquired absence of other right toe(s): Secondary | ICD-10-CM

## 2023-12-29 DIAGNOSIS — J95821 Acute postprocedural respiratory failure: Secondary | ICD-10-CM | POA: Diagnosis not present

## 2023-12-29 DIAGNOSIS — E878 Other disorders of electrolyte and fluid balance, not elsewhere classified: Secondary | ICD-10-CM | POA: Insufficient documentation

## 2023-12-29 DIAGNOSIS — K6389 Other specified diseases of intestine: Secondary | ICD-10-CM | POA: Diagnosis not present

## 2023-12-29 DIAGNOSIS — Z7984 Long term (current) use of oral hypoglycemic drugs: Secondary | ICD-10-CM

## 2023-12-29 DIAGNOSIS — E43 Unspecified severe protein-calorie malnutrition: Secondary | ICD-10-CM | POA: Diagnosis present

## 2023-12-29 DIAGNOSIS — I700 Atherosclerosis of aorta: Secondary | ICD-10-CM | POA: Diagnosis present

## 2023-12-29 DIAGNOSIS — D630 Anemia in neoplastic disease: Secondary | ICD-10-CM | POA: Diagnosis present

## 2023-12-29 DIAGNOSIS — K529 Noninfective gastroenteritis and colitis, unspecified: Secondary | ICD-10-CM | POA: Diagnosis present

## 2023-12-29 DIAGNOSIS — R001 Bradycardia, unspecified: Secondary | ICD-10-CM | POA: Diagnosis not present

## 2023-12-29 DIAGNOSIS — I129 Hypertensive chronic kidney disease with stage 1 through stage 4 chronic kidney disease, or unspecified chronic kidney disease: Secondary | ICD-10-CM | POA: Diagnosis present

## 2023-12-29 DIAGNOSIS — I10 Essential (primary) hypertension: Secondary | ICD-10-CM | POA: Diagnosis present

## 2023-12-29 DIAGNOSIS — Z7982 Long term (current) use of aspirin: Secondary | ICD-10-CM

## 2023-12-29 DIAGNOSIS — I4891 Unspecified atrial fibrillation: Secondary | ICD-10-CM | POA: Diagnosis not present

## 2023-12-29 DIAGNOSIS — E7800 Pure hypercholesterolemia, unspecified: Secondary | ICD-10-CM | POA: Diagnosis present

## 2023-12-29 DIAGNOSIS — E876 Hypokalemia: Secondary | ICD-10-CM | POA: Diagnosis present

## 2023-12-29 DIAGNOSIS — R112 Nausea with vomiting, unspecified: Secondary | ICD-10-CM

## 2023-12-29 DIAGNOSIS — Z87442 Personal history of urinary calculi: Secondary | ICD-10-CM

## 2023-12-29 DIAGNOSIS — R7989 Other specified abnormal findings of blood chemistry: Secondary | ICD-10-CM | POA: Diagnosis not present

## 2023-12-29 DIAGNOSIS — R58 Hemorrhage, not elsewhere classified: Secondary | ICD-10-CM | POA: Diagnosis not present

## 2023-12-29 DIAGNOSIS — H5461 Unqualified visual loss, right eye, normal vision left eye: Secondary | ICD-10-CM | POA: Diagnosis present

## 2023-12-29 DIAGNOSIS — R578 Other shock: Secondary | ICD-10-CM | POA: Diagnosis not present

## 2023-12-29 DIAGNOSIS — E8729 Other acidosis: Secondary | ICD-10-CM | POA: Diagnosis not present

## 2023-12-29 DIAGNOSIS — E8809 Other disorders of plasma-protein metabolism, not elsewhere classified: Secondary | ICD-10-CM | POA: Diagnosis present

## 2023-12-29 DIAGNOSIS — R188 Other ascites: Secondary | ICD-10-CM | POA: Diagnosis present

## 2023-12-29 DIAGNOSIS — D649 Anemia, unspecified: Secondary | ICD-10-CM

## 2023-12-29 DIAGNOSIS — R579 Shock, unspecified: Secondary | ICD-10-CM | POA: Diagnosis not present

## 2023-12-29 DIAGNOSIS — F03918 Unspecified dementia, unspecified severity, with other behavioral disturbance: Secondary | ICD-10-CM | POA: Insufficient documentation

## 2023-12-29 DIAGNOSIS — K567 Ileus, unspecified: Secondary | ICD-10-CM | POA: Diagnosis not present

## 2023-12-29 DIAGNOSIS — G3183 Dementia with Lewy bodies: Secondary | ICD-10-CM | POA: Diagnosis present

## 2023-12-29 DIAGNOSIS — D509 Iron deficiency anemia, unspecified: Secondary | ICD-10-CM | POA: Diagnosis present

## 2023-12-29 DIAGNOSIS — F02818 Dementia in other diseases classified elsewhere, unspecified severity, with other behavioral disturbance: Secondary | ICD-10-CM | POA: Diagnosis present

## 2023-12-29 DIAGNOSIS — J9602 Acute respiratory failure with hypercapnia: Secondary | ICD-10-CM | POA: Diagnosis not present

## 2023-12-29 DIAGNOSIS — K56609 Unspecified intestinal obstruction, unspecified as to partial versus complete obstruction: Secondary | ICD-10-CM | POA: Diagnosis present

## 2023-12-29 DIAGNOSIS — I9589 Other hypotension: Secondary | ICD-10-CM | POA: Diagnosis not present

## 2023-12-29 DIAGNOSIS — E119 Type 2 diabetes mellitus without complications: Secondary | ICD-10-CM

## 2023-12-29 DIAGNOSIS — K5939 Other megacolon: Secondary | ICD-10-CM | POA: Diagnosis present

## 2023-12-29 DIAGNOSIS — A419 Sepsis, unspecified organism: Secondary | ICD-10-CM | POA: Diagnosis not present

## 2023-12-29 DIAGNOSIS — E1151 Type 2 diabetes mellitus with diabetic peripheral angiopathy without gangrene: Secondary | ICD-10-CM | POA: Diagnosis present

## 2023-12-29 LAB — URINALYSIS, ROUTINE W REFLEX MICROSCOPIC
Bacteria, UA: NONE SEEN
Bilirubin Urine: NEGATIVE
Glucose, UA: NEGATIVE mg/dL
Hgb urine dipstick: NEGATIVE
Ketones, ur: NEGATIVE mg/dL
Leukocytes,Ua: NEGATIVE
Nitrite: NEGATIVE
Protein, ur: 30 mg/dL — AB
Specific Gravity, Urine: 1.027 (ref 1.005–1.030)
pH: 5 (ref 5.0–8.0)

## 2023-12-29 LAB — CBG MONITORING, ED: Glucose-Capillary: 126 mg/dL — ABNORMAL HIGH (ref 70–99)

## 2023-12-29 LAB — COMPREHENSIVE METABOLIC PANEL
ALT: 15 U/L (ref 0–44)
AST: 17 U/L (ref 15–41)
Albumin: 3.5 g/dL (ref 3.5–5.0)
Alkaline Phosphatase: 91 U/L (ref 38–126)
Anion gap: 13 (ref 5–15)
BUN: 29 mg/dL — ABNORMAL HIGH (ref 8–23)
CO2: 26 mmol/L (ref 22–32)
Calcium: 8.4 mg/dL — ABNORMAL LOW (ref 8.9–10.3)
Chloride: 91 mmol/L — ABNORMAL LOW (ref 98–111)
Creatinine, Ser: 0.78 mg/dL (ref 0.61–1.24)
GFR, Estimated: >60 mL/min (ref >60)
Glucose, Bld: 194 mg/dL — ABNORMAL HIGH (ref 70–99)
Potassium: 3.2 mmol/L — ABNORMAL LOW (ref 3.5–5.1)
Sodium: 130 mmol/L — ABNORMAL LOW (ref 135–145)
Total Bilirubin: 1.1 mg/dL (ref 0.0–1.2)
Total Protein: 6.7 g/dL (ref 6.5–8.1)

## 2023-12-29 LAB — CBC
HCT: 32.2 % — ABNORMAL LOW (ref 39.0–52.0)
Hemoglobin: 10.7 g/dL — ABNORMAL LOW (ref 13.0–17.0)
MCH: 28.2 pg (ref 26.0–34.0)
MCHC: 33.2 g/dL (ref 30.0–36.0)
MCV: 85 fL (ref 80.0–100.0)
Platelets: 299 10*3/uL (ref 150–400)
RBC: 3.79 MIL/uL — ABNORMAL LOW (ref 4.22–5.81)
RDW: 15.4 % (ref 11.5–15.5)
WBC: 3.8 10*3/uL — ABNORMAL LOW (ref 4.0–10.5)
nRBC: 0 % (ref 0.0–0.2)

## 2023-12-29 LAB — LIPASE, BLOOD: Lipase: 20 U/L (ref 11–51)

## 2023-12-29 MED ORDER — ACETAMINOPHEN 325 MG PO TABS: 650.0000 mg | ORAL_TABLET | Freq: Four times a day (QID) | ORAL | Status: DC | PRN

## 2023-12-29 MED ORDER — IOHEXOL 300 MG/ML  SOLN
100.0000 mL | Freq: Once | INTRAMUSCULAR | Status: AC | PRN
  Administered 2023-12-29: 100 mL via INTRAVENOUS

## 2023-12-29 MED ORDER — ASPIRIN 81 MG PO TBEC
81.0000 mg | DELAYED_RELEASE_TABLET | Freq: Every day | ORAL | Status: DC
  Administered 2023-12-29: 81 mg via ORAL
  Filled 2023-12-29: qty 1

## 2023-12-29 MED ORDER — ONDANSETRON HCL 4 MG/2ML IJ SOLN
4.0000 mg | Freq: Four times a day (QID) | INTRAMUSCULAR | Status: DC | PRN
  Administered 2023-12-31: 4 mg via INTRAVENOUS

## 2023-12-29 MED ORDER — ONDANSETRON HCL 4 MG/2ML IJ SOLN
4.0000 mg | Freq: Once | INTRAMUSCULAR | Status: AC
  Administered 2023-12-29: 4 mg via INTRAVENOUS
  Filled 2023-12-29: qty 2

## 2023-12-29 MED ORDER — ONDANSETRON HCL 4 MG PO TABS: 4.0000 mg | ORAL_TABLET | Freq: Four times a day (QID) | ORAL | Status: DC | PRN

## 2023-12-29 MED ORDER — SODIUM CHLORIDE 0.9% FLUSH
10.0000 mL | Freq: Once | INTRAVENOUS | Status: DC | PRN
  Filled 2023-12-29: qty 10

## 2023-12-29 MED ORDER — SODIUM CHLORIDE 0.9 % IV BOLUS
1000.0000 mL | Freq: Once | INTRAVENOUS | Status: AC
  Administered 2023-12-29: 1000 mL via INTRAVENOUS

## 2023-12-29 MED ORDER — IRON SUCROSE 20 MG/ML IV SOLN: 200.0000 mg | Freq: Once | INTRAVENOUS | Status: DC

## 2023-12-29 MED ORDER — RIVASTIGMINE TARTRATE 3 MG PO CAPS
3.0000 mg | ORAL_CAPSULE | Freq: Two times a day (BID) | ORAL | Status: DC
  Administered 2023-12-29 – 2024-01-03 (×9): 3 mg via ORAL
  Filled 2023-12-29 (×13): qty 1

## 2023-12-29 MED ORDER — ATORVASTATIN CALCIUM 20 MG PO TABS
40.0000 mg | ORAL_TABLET | Freq: Two times a day (BID) | ORAL | Status: DC
  Administered 2023-12-29 – 2023-12-30 (×3): 40 mg via ORAL
  Filled 2023-12-29 (×3): qty 2

## 2023-12-29 MED ORDER — MORPHINE SULFATE (PF) 4 MG/ML IV SOLN
4.0000 mg | Freq: Once | INTRAVENOUS | Status: AC
  Administered 2023-12-29: 4 mg via INTRAVENOUS
  Filled 2023-12-29: qty 1

## 2023-12-29 MED ORDER — ACETAMINOPHEN 650 MG RE SUPP: 650.0000 mg | Freq: Four times a day (QID) | RECTAL | Status: DC | PRN

## 2023-12-29 MED ORDER — INSULIN ASPART 100 UNIT/ML IJ SOLN
0.0000 [IU] | INTRAMUSCULAR | Status: DC
  Administered 2023-12-29 – 2024-01-02 (×9): 2 [IU] via SUBCUTANEOUS
  Administered 2024-01-02: 3 [IU] via SUBCUTANEOUS
  Administered 2024-01-02 – 2024-01-03 (×2): 2 [IU] via SUBCUTANEOUS
  Filled 2023-12-29 (×12): qty 1

## 2023-12-29 MED ORDER — MORPHINE SULFATE (PF) 2 MG/ML IV SOLN
2.0000 mg | INTRAVENOUS | Status: DC | PRN
  Administered 2023-12-29 – 2024-01-04 (×17): 2 mg via INTRAVENOUS
  Filled 2023-12-29 (×17): qty 1

## 2023-12-29 MED ORDER — SODIUM CHLORIDE 0.9 % IV SOLN: INTRAVENOUS | Status: DC

## 2023-12-29 MED ORDER — HYDRALAZINE HCL 20 MG/ML IJ SOLN: 5.0000 mg | Freq: Four times a day (QID) | INTRAMUSCULAR | Status: DC | PRN

## 2023-12-29 NOTE — ED Notes (Signed)
 First Nurse Note: Pt to ED via the cancer center. Pt send over for possible bowel obstructions. Pt is not currently vomiting. Pt has IV in place from cancer center

## 2023-12-29 NOTE — Telephone Encounter (Signed)
 I called the house and spoke to wife. His stomach was feeling upset and he vomited after eating and taking the pils and then he was back to being ok, so he can come in this am to get the infusion iv iron

## 2023-12-29 NOTE — Telephone Encounter (Signed)
 Answering service call... Alex Soto his wife called and said Alex Soto threw up yesterday and has an infusion appt today at 130 pm. Can he still come? [phone] Dr Sharlette Dense patient.

## 2023-12-29 NOTE — Assessment & Plan Note (Signed)
 Sliding scale insulin coverage

## 2023-12-29 NOTE — Assessment & Plan Note (Signed)
 Adenocarcinoma transverse colon, recently diagnosed Patient not yet on treatment.  First visit oncologist was 2/26 Will keep n.p.o. as advised by surgeon, Dr. Maurine Minister Antiemetics, pain control Formal surgical consult

## 2023-12-29 NOTE — Assessment & Plan Note (Signed)
 Delirium precautions

## 2023-12-29 NOTE — ED Provider Notes (Signed)
 [facility] Provider Note    Event Date/Time   First MD Initiated Contact with Patient 12/29/23 1746     (approximate)   History   Abdominal Pain   HPI Alex Soto. is a 78 y.o. male with history of HTN, DM2, HLD, colon cancer presenting today for abdominal pain.  Patient has had several days of nausea and vomiting with generalized abdominal pain.  He was sent from the cancer center to rule out possible obstruction.  Patient with dementia and some difficulty explaining the story but family here with him states for the past 1 to 2 days he has had new onset nausea, vomiting, and generalized abdominal pain.  He has had no p.o. intake today with last p.o. intake yesterday.  He was seen by his cancer team today and they sent him here to rule out obstruction.  He otherwise denies cough, congestion, chest pain.  He has had a bowel movement today reportedly.  No dysuria.  Current plan from a cancer standpoint was to see general surgery next week to discuss surgical intervention.     Physical Exam   Triage Vital Signs: ED Triage Vitals  Encounter Vitals Group     BP 12/29/23 1419 (!) 120/56     Systolic BP Percentile --      Diastolic BP Percentile --      Pulse Rate 12/29/23 1417 99     Resp 12/29/23 1417 17     Temp 12/29/23 1417 98.8 F (37.1 C)     Temp Source 12/29/23 1417 Oral     SpO2 12/29/23 1417 97 %     Weight 12/29/23 1418 156 lb 1.4 oz (70.8 kg)     Height 12/29/23 1418 5' 11.5" (1.816 m)     Head Circumference --      Peak Flow --      Pain Score 12/29/23 1418 7     Pain Loc --      Pain Education --      Exclude from Growth Chart --     Most recent vital signs: Vitals:   12/29/23 1417 12/29/23 1419  BP:  (!) 120/56  Pulse: 99   Resp: 17   Temp: 98.8 F (37.1 C)   SpO2: 97%    Physical Exam: I have reviewed the vital signs and nursing notes. General: Awake, alert, no acute distress.  Nontoxic appearing. Head:  Atraumatic,  normocephalic.   ENT:  EOM intact, PERRL. Oral mucosa is pink and moist with no lesions. Neck: Neck is supple with full range of motion, No meningeal signs. Cardiovascular:  RRR, No murmurs. Peripheral pulses palpable and equal bilaterally. Respiratory:  Symmetrical chest wall expansion.  No rhonchi, rales, or wheezes.  Good air movement throughout.  No use of accessory muscles.   Musculoskeletal:  No cyanosis or edema. Moving extremities with full ROM Abdomen:  Soft, mild generalized tenderness palpation throughout abdomen, nondistended. Neuro:  GCS 15, moving all four extremities, interacting appropriately. Speech clear. Psych:  Calm, appropriate.   Skin:  Warm, dry, no rash.    ED Results / Procedures / Treatments   Labs (all labs ordered are listed, but only abnormal results are displayed) Labs Reviewed  COMPREHENSIVE METABOLIC PANEL - Abnormal; Notable for the following components:      Result Value   Sodium 130 (*)    Potassium 3.2 (*)    Chloride 91 (*)    Glucose, Bld 194 (*)    BUN  29 (*)    Calcium 8.4 (*)    All other components within normal limits  CBC - Abnormal; Notable for the following components:   WBC 3.8 (*)    RBC 3.79 (*)    Hemoglobin 10.7 (*)    HCT 32.2 (*)    All other components within normal limits  URINALYSIS, ROUTINE W REFLEX MICROSCOPIC - Abnormal; Notable for the following components:   Color, Urine YELLOW (*)    APPearance HAZY (*)    Protein, ur 30 (*)    All other components within normal limits  LIPASE, BLOOD     EKG    RADIOLOGY Independently interpreted CT imaging with evidence of worsening bowel obstruction related to patient's proximal transverse colon   PROCEDURES:  Critical Care performed: No  Procedures   MEDICATIONS ORDERED IN ED: Medications  sodium chloride 0.9 % bolus 1,000 mL (1,000 mLs Intravenous New Bag/Given 12/29/23 1811)  ondansetron (ZOFRAN) injection 4 mg (4 mg Intravenous Given 12/29/23 1811)  morphine  (PF) 4 MG/ML injection 4 mg (4 mg Intravenous Given 12/29/23 1812)  iohexol (OMNIPAQUE) 300 MG/ML solution 100 mL (100 mLs Intravenous Contrast Given 12/29/23 1831)     IMPRESSION / MDM / ASSESSMENT AND PLAN / ED COURSE  I reviewed the triage vital signs and the nursing notes.                              Differential diagnosis includes, but is not limited to, small bowel obstruction, large bowel obstruction, electrolyte abnormalities, gastroenteritis  Patient's presentation is most consistent with acute presentation with potential threat to life or bodily function.  Patient is a 78 year old male with recently diagnosed transverse colon cancer presenting today for nausea, vomiting, generalized abdominal pain.  Physical exam with mild tenderness throughout but otherwise vital signs reassuring.  Reportedly has had a bowel movement today and no active nausea at this time.  Laboratory workup with slight signs of possible dehydration but no AKI.  CBC and lipase otherwise consistent with baseline.  CT imaging ordered for further evaluation to rule out obstruction.  Patient treated with 1 L fluids, Zofran, and morphine.  CT imaging shows evidence of worsening obstruction in the proximal transverse colon secondary to known cancer.  Patient otherwise stable.  Discussed the case with general surgery who does recommend admission and n.p.o. for potential procedure tomorrow after evaluation.  Patient admitted to hospitalist for further care.  The patient is on the cardiac monitor to evaluate for evidence of arrhythmia and/or significant heart rate changes. Clinical Course as of 12/29/23 2002  Fri Dec 29, 2023  1921 CT ABDOMEN PELVIS W CONTRAST Worsening obstruction in the proximal transverse colon secondary to the known adenocarcinoma compared with CT 10/14/2023. 2. Increased wall thickening of the cecum and ascending colon compared to 10/14/2023. There are few additional segments of wall thickening in the  mid and distal small bowel. Findings are favored to represent nonspecific enterocolitis. [DW]  1935 Gen surg - admit to medicine. NPO. Surg to evaluate in AM [DW]    Clinical Course User Index [DW] Janith Lima, MD     FINAL CLINICAL IMPRESSION(S) / ED DIAGNOSES   Final diagnoses:  Large bowel obstruction (HCC)  Nausea and vomiting, unspecified vomiting type     Rx / DC Orders   ED Discharge Orders     None        Note:  This document was prepared using  [employer]agon [employer] and may include unintentional dictation errors.   Janith Lima, MD 12/29/23 2003

## 2023-12-29 NOTE — Assessment & Plan Note (Signed)
IV hydralazine as needed while n.p.o. 

## 2023-12-29 NOTE — Progress Notes (Signed)
 Daughter in law with patient has several concerns and would like to speak to someone. She states pt has not eaten in the past 2 days, c/o nausea and vomiting, sleeping a lot, pale in color, and complains of continuous abdominal pain (7/10). Reported concerns to Dr. Donneta Romberg. Pt was seen in infusion area by Elouise Munroe, NP, at request of Dr. Donneta Romberg. Due to concern of obstruction, it is recommended by the medical team that patient be evaluated in the ED. 22G IV started in the right forearm. Pt transported via wheelchair to ED.

## 2023-12-29 NOTE — ED Notes (Addendum)
 ED TO INPATIENT HANDOFF REPORT  ED Nurse Name and Phone #: [phone]  S Name/Age/Gender Alex Silversmith Sr. 78 y.o. male Room/Bed: ED06A/ED06A  Code Status   Code Status: Prior  Home/SNF/Other Home Patient oriented to: self, place, time, and situation Is this baseline? Yes   Triage Complete: Triage complete  Chief Complaint Small bowel obstruction (HCC) [K56.609]  Triage Note Pt here from the cancer center with possible obstruction. Pt had an abd tumor currently. Pt endorses N/V and belching. Pt has not eaten since Wed. Pt had a fall on Tuesday tripping over his rug, denies hitting his head. Pt has a hx of dementia.   Allergies No Known Allergies  Level of Care/Admitting Diagnosis ED Disposition     ED Disposition  Admit   Condition  --   Comment  Hospital Area: [facility] [[id number]]  Level of Care: Med-Surg [16]  Covid Evaluation: Asymptomatic - no recent exposure (last 10 days) testing not required  Diagnosis: Small bowel obstruction [facility]) [[id number]]  Admitting Physician: Andris Baumann [[id number]]  Attending Physician: Andris Baumann [[id number]]  Certification:: I certify this patient will need inpatient services for at least 2 midnights  Expected Medical Readiness: 01/02/2024          B Medical/Surgery History Past Medical History:  Diagnosis Date   Anemia    Atherosclerosis of abdominal aorta (HCC)    Blind right eye    Colon cancer (HCC)    Dementia (HCC)    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  Only wears upper   Past Surgical History:  Procedure Laterality Date   AMPUTATION TOE Right 03/01/2020   Procedure: AMPUTATION TOE;  Surgeon: [name], DPM;  Location: [facility];  Service: Podiatry;  Laterality: Right;   AMPUTATION TOE Right 01/03/2023   Procedure: AMPUTATION TOE, 2nd right;  Surgeon: [name], DPM;  Location: [facility];  Service: Podiatry;  Laterality: Right;   APPENDECTOMY     BIOPSY  12/20/2023   Procedure: BIOPSY;  Surgeon: Norma Fredrickson, Boykin Nearing, MD;  Location: [facility];  Service: Gastroenterology;;   BONE EXCISION Right 01/03/2023   Procedure: Exostectomy interphalangeal joint right great toe;  Surgeon: [name], DPM;  Location: [facility];  Service: Podiatry;  Laterality: Right;   CATARACT EXTRACTION W/PHACO Left 09/06/2021   Procedure: CATARACT EXTRACTION PHACO AND INTRAOCULAR LENS PLACEMENT (IOC) LEFT 2.71 00:30.6;  Surgeon: Nevada Crane, MD;  Location: [facility];  Service: Ophthalmology;  Laterality: Left;  Diabetic   COLONOSCOPY WITH PROPOFOL N/A 12/20/2023   Procedure: COLONOSCOPY WITH PROPOFOL;  Surgeon: Toledo, Boykin Nearing, MD;  Location: [facility] ENDOSCOPY;  Service: Gastroenterology;  Laterality: N/A;   ESOPHAGOGASTRODUODENOSCOPY (EGD) WITH PROPOFOL N/A 12/20/2023   Procedure: ESOPHAGOGASTRODUODENOSCOPY (EGD) WITH PROPOFOL;  Surgeon: Toledo, Boykin Nearing, MD;  Location: [facility] END[facility];  Service: Gastroenterology;  Laterality: N/A;   EYE SURGERY     age 50   HERNIA REPAIR  INSERTION OF MESH  06/23/2023   Procedure: INSERTION OF MESH;  Surgeon: Sung Amabile, DO;  Location: [facility];  Service: General;;   KIDNEY STONE SURGERY     REMOVAL RETAINED LENS Right 04/19/2021   Procedure: CATARACT EXTRACTION PHACO,  RIGHT VISION BLUE 0.20 00:01.4 case aborted;  Surgeon: Nevada Crane, MD;  Location: [facility];  Service: Ophthalmology;  Laterality: Right;  Diabetic - oral meds    SHOULDER ARTHROSCOPY WITH BICEPS TENDON REPAIR Right 10/06/2016   Procedure: SHOULDER ARTHROSCOPY WITH BICEPS TENDON REPAIR;  Surgeon: Christena Flake, MD;  Location: [facility];  Service: Orthopedics;  Laterality: Right;   SHOULDER ARTHROSCOPY WITH OPEN ROTATOR CUFF REPAIR Right 10/06/2016   Procedure: SHOULDER ARTHROSCOPY WITH OPEN ROTATOR CUFF REPAIR;  Surgeon: Christena Flake, MD;  Location: [facility];  Service: Orthopedics;  Laterality: Right;   SHOULDER ARTHROSCOPY WITH SUBACROMIAL DECOMPRESSION Right 10/06/2016   Procedure: SHOULDER ARTHROSCOPY WITH SUBACROMIAL DECOMPRESSION AND DEBRIDEMENT;  Surgeon: Christena Flake, MD;  Location: [facility];  Service: Orthopedics;  Laterality: Right;   SUBMUCOSAL TATTOO INJECTION  12/20/2023   Procedure: SUBMUCOSAL TATTOO INJECTION;  Surgeon: Norma Fredrickson, Boykin Nearing, MD;  Location: [facility];  Service: Gastroenterology;;   TONSILLECTOMY       A IV Location/Drains/Wounds Patient Lines/Drains/Airways Status     Active Line/Drains/Airways     Name Placement date Placement time Site Days   Peripheral IV 12/29/23 Right;Medial Forearm 12/29/23  1340  Forearm  less than 1   Peripheral IV 12/29/23 20 G Anterior;Left;Proximal Forearm 12/29/23  1833  Forearm  less than 1   Incision - 3 Ports Abdomen Umbilicus Left;Lateral Right;Lateral 06/23/23  0923  -- 189            Intake/Output Last 24 hours No intake or output data in the 24 hours ending 12/29/23 2024  Labs/Imaging Results for orders placed or performed during the hospital encounter of 12/29/23 (from the past 48 hours)  Lipase, blood     Status: None   Collection Time: 12/29/23  2:20 PM  Result Value Ref Range   Lipase 20 11 - 51 U/L    Comment: Performed at [facility], [address] [place], [place] [address]  Comprehensive metabolic panel     Status: Abnormal   Collection Time: 12/29/23  2:20 PM  Result Value Ref Range   Sodium 130 (L) 135 - 145 mmol/L   Potassium 3.2 (L) 3.5 - 5.1  mmol/L   Chloride 91 (L) 98 - 111 mmol/L   CO2 26 22 - 32 mmol/L   Glucose, Bld 194 (H) 70 - 99 mg/dL    Comment: Glucose reference range applies only to samples taken after fasting for at least 8 hours.   BUN 29 (H) 8 - 23 mg/dL   Creatinine, Ser 6.04 0.61 - 1.24 mg/dL   Calcium 8.4 (L) 8.9 - 10.3 mg/dL   Total Protein 6.7 6.5 - 8.1 g/dL   Albumin 3.5 3.5 - 5.0 g/dL   AST 17 15 - 41 U/L   ALT 15 0 - 44 U/L   Alkaline Phosphatase 91 38 - 126 U/L   Total Bilirubin 1.1 0.0 - 1.2 mg/dL   GFR, Estimated >54 >09 mL/min    Comment: (NOTE) Calculated using the CKD-EPI Creatinine Equation (2021)    Anion gap 13 5 - 15    Comment: Performed at [facility], [address] [place], [place] [address]  CBC     Status: Abnormal  Collection Time: 12/29/23  2:20 PM  Result Value Ref Range   WBC 3.8 (L) 4.0 - 10.5 K/uL   RBC 3.79 (L) 4.22 - 5.81 MIL/uL   Hemoglobin 10.7 (L) 13.0 - 17.0 g/dL   HCT 19.1 (L) 47.8 - 29.5 %   MCV 85.0 80.0 - 100.0 fL   MCH 28.2 26.0 - 34.0 pg   MCHC 33.2 30.0 - 36.0 g/dL   RDW 62.1 30.8 - 65.7 %   Platelets 299 150 - 400 K/uL   nRBC 0.0 0.0 - 0.2 %    Comment: Performed at [facility], [address] [place], [place] [address]  Urinalysis, Routine w reflex microscopic -Urine, Clean Catch     Status: Abnormal   Collection Time: 12/29/23  5:46 PM  Result Value Ref Range   Color, Urine YELLOW (A) YELLOW   APPearance HAZY (A) CLEAR   Specific Gravity, Urine 1.027 1.005 - 1.030   pH 5.0 5.0 - 8.0   Glucose, UA NEGATIVE NEGATIVE mg/dL   Hgb urine dipstick NEGATIVE NEGATIVE   Bilirubin Urine NEGATIVE NEGATIVE   Ketones, ur NEGATIVE NEGATIVE mg/dL   Protein, ur 30 (A) NEGATIVE mg/dL   Nitrite NEGATIVE NEGATIVE   Leukocytes,Ua NEGATIVE NEGATIVE   RBC / HPF 0-5 0 - 5 RBC/hpf   WBC, UA 0-5 0 - 5 WBC/hpf   Bacteria, UA NONE SEEN NONE SEEN   Squamous Epithelial / HPF 0-5 0 - 5 /HPF   Mucus PRESENT    Hyaline Casts, UA PRESENT     Comment:  Performed at [facility], [address] [place], [place] [address]   CT ABDOMEN PELVIS W CONTRAST Result Date: 12/29/2023 CLINICAL DATA:  Nausea, vomiting, generalized abdominal pain for 1-2 days. Recent diagnosis of colon cancer. Rule out obstruction. EXAM: CT ABDOMEN AND PELVIS WITH CONTRAST TECHNIQUE: Multidetector CT imaging of the abdomen and pelvis was performed using the standard protocol following bolus administration of intravenous contrast. RADIATION DOSE REDUCTION: This exam was performed according to the departmental dose-optimization program which includes automated exposure control, adjustment of the mA and/or kV according to patient size and/or use of iterative reconstruction technique. CONTRAST:  OMNIPAQUE IOHEXOL 300 MG/ML  SOLN COMPARISON:  CT abdomen and pelvis 10/14/2023 FINDINGS: Lower chest: No acute abnormality. Hepatobiliary: No focal hepatic lesion. Unremarkable gallbladder and biliary tree. Pancreas: Unremarkable. Spleen: Unremarkable. Adrenals/Urinary Tract: Stable adrenal glands. No urinary calculi or hydronephrosis. Unremarkable bladder. Stomach/Bowel: Stomach is within normal limits. Fluid-filled mid and distal small bowel at the upper limits of normal in caliber and increased compared to 10/14/2023. The cecum and ascending colon are fluid-filled with abrupt transition point in the proximal transverse colon at the site of the mass seen on colonoscopy 12/20/2023. There is increased wall thickening of the cecum and ascending colon compared to 10/14/2023. There are few additional segments of wall thickening in the mid and distal small bowel. Diverticulosis in the descending colon. Trace adjacent stranding and fluid. Vascular/Lymphatic: Aortic atherosclerosis. No enlarged abdominal or pelvic lymph nodes. Reproductive: No acute abnormality. Other: No free intraperitoneal air. Musculoskeletal: No acute fracture.  No destructive osseous lesion. IMPRESSION: 1. Worsening  obstruction in the proximal transverse colon secondary to the known adenocarcinoma compared with CT 10/14/2023. 2. Increased wall thickening of the cecum and ascending colon compared to 10/14/2023. There are few additional segments of wall thickening in the mid and distal small bowel. Findings are favored to represent nonspecific enterocolitis. 3. Question mild diverticulitis in the descending colon. 4.  Aortic Atherosclerosis (ICD10-I70.0). Electronically Signed   By: Minerva Fester M.D.   On: 12/29/2023 19:18    Pending Labs Unresulted Labs (From admission, onward)    None       Vitals/Pain Today's Vitals   12/29/23 1419 12/29/23 1811 12/29/23 1812 12/29/23 1918  BP: (!) 120/56     Pulse:      Resp:      Temp:      TempSrc:      SpO2:      Weight:      Height:      PainSc:  7  7  5      Isolation Precautions No active isolations  Medications Medications  sodium chloride 0.9 % bolus 1,000 mL (1,000 mLs Intravenous New Bag/Given 12/29/23 1811)  ondansetron (ZOFRAN) injection 4 mg (4 mg Intravenous Given 12/29/23 1811)  morphine (PF) 4 MG/ML injection 4 mg (4 mg Intravenous Given 12/29/23 1812)  iohexol (OMNIPAQUE) 300 MG/ML solution 100 mL (100 mLs Intravenous Contrast Given 12/29/23 1831)    Mobility walks with device     Focused Assessments GI   R Recommendations: See Admitting Provider Note  Report given to:   Additional Notes:

## 2023-12-29 NOTE — H&P (Addendum)
 History and Physical    Alex Soto: Alex ICENOGLE Sr. ZOX:[id number] DOB: Feb 12, 1946 DOA: 12/29/2023 DOS: the Alex Soto was seen and examined on 12/29/2023 PCP: [name], MD  Alex Soto coming from: Home  Chief Complaint:  Chief Complaint  Alex Soto presents with   Abdominal Pain    HPI: Alex Stiff. is a 78 y.o. male with medical history significant for DM, HTN, Lewy body dementia, recently diagnosed adenocarcinoma of the colon on colonoscopy 12/20/2023 indicated for iron deficiency anemia and ongoing abdominal pain for which she was seen in the ED in December, seen by oncology a couple days ago on 2/26 when therapy was discussed.  Over the past 2 days Alex Soto developed nausea and vomiting and right lower quadrant pain.  Alex Soto wife called the cancer center today and they were directed to the ED for further evaluation.  Wife and son at bedside provide the history. ED course and data review: Vitals within normal limit Labs notable for sodium 130, potassium 3.2, BUN/creatinine 29/0.78, urinalysis unremarkable CBC with WBC 3.8 and hemoglobin 10.7 platelets 299 CT abdomen and pelvis showing transverse colon obstruction as follows: IMPRESSION: 1. Worsening obstruction in the proximal transverse colon secondary to the known adenocarcinoma compared with CT 10/14/2023. 2. Increased wall thickening of the cecum and ascending colon compared to 10/14/2023. There are few additional segments of wall thickening in the mid and distal small bowel. Findings are favored to represent nonspecific enterocolitis.  The ED provider spoke with surgeon Dr. Maurine Minister who advised to keep Alex Soto n.p.o. Alex Soto was treated with morphine, Zofran and given an NS bolus  Hospitalist consulted for admission.     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  Only wears upper   Past Surgical History:  Procedure Laterality Date   AMPUTATION TOE Right 03/01/2020   Procedure: AMPUTATION TOE;  Surgeon: [name], DPM;  Location: [facility];  Service: Podiatry;  Laterality: Right;   AMPUTATION TOE Right 01/03/2023   Procedure: AMPUTATION TOE, 2nd right;  Surgeon: [name], DPM;  Location: [facility];  Service: Podiatry;  Laterality: Right;   APPENDECTOMY     BIOPSY  12/20/2023   Procedure: BIOPSY;  Surgeon: Norma Fredrickson, Boykin Nearing, MD;  Location: [facility];  Service: Gastroenterology;;   BONE EXCISION Right 01/03/2023   Procedure: Exostectomy interphalangeal joint right great toe;  Surgeon: [name], DPM;  Location: [facility];  Service: Podiatry;  Laterality: Right;   CATARACT EXTRACTION W/PHACO Left 09/06/2021   Procedure: CATARACT EXTRACTION PHACO AND INTRAOCULAR LENS PLACEMENT (IOC) LEFT 2.71 00:30.6;  Surgeon: Nevada Crane, MD;  Location: [facility];  Service: Ophthalmology;  Laterality: Left;  Diabetic   COLONOSCOPY WITH PROPOFOL N/A 12/20/2023   Procedure: COLONOSCOPY WITH PROPOFOL;  Surgeon: Toledo, Boykin Nearing, MD;  Location: [facility] ENDOS[facility];  Service: Gastroenterology;  Laterality: N/A;   ESOPHAGOGASTRODUODENOSCOPY (EGD) WITH PROPOFOL N/A 12/20/2023   Procedure: ESOPHAGOGASTRODUODENOSCOPY (EGD) WITH PROPOFOL;  Surgeon: Toledo, Boykin Nearing, MD;  Location: [facility] ENDOS[facility];  Service: Gastroenterology;  Laterality: N/A;   EYE SURGERY     age 86   HERNIA  REPAIR     INSERTION OF MESH  06/23/2023   Procedure: INSERTION OF MESH;  Surgeon: Sung Amabile, DO;  Location: [facility];  Service: General;;   KIDNEY STONE SURGERY     REMOVAL RETAINED LENS Right 04/19/2021   Procedure: CATARACT EXTRACTION PHACO,  RIGHT VISION BLUE 0.20 00:01.4 case aborted;  Surgeon: Nevada Crane, MD;  Location: [facility];  Service: Ophthalmology;  Laterality: Right;  Diabetic - oral meds   SHOULDER ARTHROSCOPY WITH BICEPS TENDON REPAIR Right 10/06/2016   Procedure: SHOULDER ARTHROSCOPY WITH BICEPS TENDON REPAIR;  Surgeon: Christena Flake, MD;  Location: [facility];  Service: Orthopedics;  Laterality: Right;   SHOULDER ARTHROSCOPY WITH OPEN ROTATOR CUFF REPAIR Right 10/06/2016   Procedure: SHOULDER ARTHROSCOPY WITH OPEN ROTATOR CUFF REPAIR;  Surgeon: Christena Flake, MD;  Location: [facility];  Service: Orthopedics;  Laterality: Right;   SHOULDER ARTHROSCOPY WITH SUBACROMIAL DECOMPRESSION Right 10/06/2016   Procedure: SHOULDER ARTHROSCOPY WITH SUBACROMIAL DECOMPRESSION AND DEBRIDEMENT;  Surgeon: Christena Flake, MD;  Location: [facility];  Service: Orthopedics;  Laterality: Right;   SUBMUCOSAL TATTOO INJECTION  12/20/2023   Procedure: SUBMUCOSAL TATTOO INJECTION;  Surgeon: Toledo, Boykin Nearing, MD;  Location: [facility];  Service: Gastroenterology;;   TONSILLECTOMY     Social History:  reports that Alex Soto quit smoking about 54 years ago. Alex Soto smoking use included cigarettes. Alex Soto started smoking about 64 years ago. Alex Soto has a 2.5 pack-year smoking history. Alex Soto has been exposed to tobacco smoke. Alex Soto has quit using smokeless tobacco. Alex Soto reports that Alex Soto does not drink alcohol and does not use drugs.  No Known Allergies  Family History  Family history unknown: Yes    Prior to Admission medications   Medication Sig Start Date End Date Taking? Authorizing Provider  aspirin 81 MG tablet Take 81 mg by mouth daily.    [provider]  atorvastatin (LIPITOR) 40 MG tablet Take 40 mg  by mouth 2 (two) times daily.    [provider]  Cholecalciferol (VITAMIN D3) 50 MCG (2000 UT) TABS Take 1 tablet by mouth daily.    [provider]  cyanocobalamin (VITAMIN B12) 1000 MCG tablet Take 1,000 mcg by mouth daily.    [provider]  ferrous sulfate 325 (65 FE) MG tablet Take 325 mg by mouth 2 (two) times daily with a meal.    [provider]  glipiZIDE (GLUCOTROL XL) 5 MG 24 hr tablet Take 5 mg by mouth daily with breakfast.    [provider]  metFORMIN (GLUCOPHAGE) 500 MG tablet Take 500 mg by mouth 2 (two) times daily with a meal.    [provider]  pioglitazone (ACTOS) 15 MG tablet Take 15 mg by mouth daily.    [provider]  rivastigmine (EXELON) 3 MG capsule Take 3 mg by mouth 2 (two) times daily.    [provider]    Physical Exam: Vitals:   12/29/23 1930 12/29/23 2030 12/29/23 2048 12/29/23 2100  BP: (!) 150/64 130/65  134/65  Pulse: (!) 53 78  87  Resp: 14 16  19   Temp:   98.6 F (37 C)  TempSrc:   Oral   SpO2: 98% 100%  98%  Weight:      Height:       Physical Exam Vitals and nursing note reviewed.  Constitutional:      General: Alex Soto is not in acute distress. HENT:     Head: Normocephalic and atraumatic.  Cardiovascular:     Rate and Rhythm: Normal rate and regular rhythm.     Heart sounds: Normal heart sounds.  Pulmonary:     Effort: Pulmonary effort is normal.     Breath sounds: Normal breath sounds.  Abdominal:     Palpations: Abdomen is soft.     Tenderness: There is abdominal tenderness in the right lower quadrant.  Neurological:     Mental Status: Mental status is at baseline.     Labs on Admission: I have personally reviewed following labs and imaging studies  CBC: Recent Labs  Lab 12/27/23 1515 12/29/23 1420  WBC 5.1 3.8*  NEUTROABS 4.0  --   HGB 10.8* 10.7*  HCT 33.6* 32.2*  MCV 85.7 85.0  PLT 271 299   Basic Metabolic Panel: Recent Labs  Lab  12/27/23 1515 12/29/23 1420  NA 132* 130*  K 3.9 3.2*  CL 94* 91*  CO2 26 26  GLUCOSE 190* 194*  BUN 21 29*  CREATININE 0.79 0.78  CALCIUM 9.2 8.4*   GFR: Estimated Creatinine Clearance: 77.4 mL/min (by C-G formula based on SCr of 0.78 mg/dL). Liver Function Tests: Recent Labs  Lab 12/27/23 1515 12/29/23 1420  AST 19 17  ALT 17 15  ALKPHOS 93 91  BILITOT 1.0 1.1  PROT 7.3 6.7  ALBUMIN 4.0 3.5   Recent Labs  Lab 12/29/23 1420  LIPASE 20   No results for input(s): "AMMONIA" in the last 168 hours. Coagulation Profile: No results for input(s): "INR", "PROTIME" in the last 168 hours. Cardiac Enzymes: No results for input(s): "CKTOTAL", "CKMB", "CKMBINDEX", "TROPONINI" in the last 168 hours. BNP (last 3 results) No results for input(s): "PROBNP" in the last 8760 hours. HbA1C: No results for input(s): "HGBA1C" in the last 72 hours. CBG: No results for input(s): "GLUCAP" in the last 168 hours. Lipid Profile: No results for input(s): "CHOL", "HDL", "LDLCALC", "TRIG", "CHOLHDL", "LDLDIRECT" in the last 72 hours. Thyroid Function Tests: No results for input(s): "TSH", "T4TOTAL", "FREET4", "T3FREE", "THYROIDAB" in the last 72 hours. Anemia Panel: Recent Labs    12/27/23 1515  FERRITIN 10*  TIBC 277  IRON 48   Urine analysis:    Component Value Date/Time   COLORURINE YELLOW (A) 12/29/2023 1746   APPEARANCEUR HAZY (A) 12/29/2023 1746   APPEARANCEUR Cloudy 04/08/2014 1251   LABSPEC 1.027 12/29/2023 1746   LABSPEC 1.029 04/08/2014 1251   PHURINE 5.0 12/29/2023 1746   GLUCOSEU NEGATIVE 12/29/2023 1746   GLUCOSEU 150 mg/dL 47/82/9562 1308   HGBUR NEGATIVE 12/29/2023 1746   BILIRUBINUR NEGATIVE 12/29/2023 1746   BILIRUBINUR Negative 04/08/2014 1251   KETONESUR NEGATIVE 12/29/2023 1746   PROTEINUR 30 (A) 12/29/2023 1746   NITRITE NEGATIVE 12/29/2023 1746   LEUKOCYTESUR NEGATIVE 12/29/2023 1746   LEUKOCYTESUR Negative 04/08/2014 1251    Radiological Exams on  Admission: CT ABDOMEN PELVIS W CONTRAST Result Date: 12/29/2023 CLINICAL DATA:  Nausea, vomiting, generalized abdominal pain for 1-2 days. Recent diagnosis of colon cancer. Rule out obstruction. EXAM: CT ABDOMEN AND PELVIS WITH CONTRAST TECHNIQUE: Multidetector CT imaging of the abdomen and pelvis was performed using the standard protocol following bolus administration of intravenous contrast. RADIATION DOSE REDUCTION:  This exam was performed according to the departmental dose-optimization program which includes automated exposure control, adjustment of the mA and/or kV according to Alex Soto size and/or use of iterative reconstruction technique. CONTRAST:  OMNIPAQUE IOHEXOL 300 MG/ML  SOLN COMPARISON:  CT abdomen and pelvis 10/14/2023 FINDINGS: Lower chest: No acute abnormality. Hepatobiliary: No focal hepatic lesion. Unremarkable gallbladder and biliary tree. Pancreas: Unremarkable. Spleen: Unremarkable. Adrenals/Urinary Tract: Stable adrenal glands. No urinary calculi or hydronephrosis. Unremarkable bladder. Stomach/Bowel: Stomach is within normal limits. Fluid-filled mid and distal small bowel at the upper limits of normal in caliber and increased compared to 10/14/2023. The cecum and ascending colon are fluid-filled with abrupt transition point in the proximal transverse colon at the site of the mass seen on colonoscopy 12/20/2023. There is increased wall thickening of the cecum and ascending colon compared to 10/14/2023. There are few additional segments of wall thickening in the mid and distal small bowel. Diverticulosis in the descending colon. Trace adjacent stranding and fluid. Vascular/Lymphatic: Aortic atherosclerosis. No enlarged abdominal or pelvic lymph nodes. Reproductive: No acute abnormality. Other: No free intraperitoneal air. Musculoskeletal: No acute fracture.  No destructive osseous lesion. IMPRESSION: 1. Worsening obstruction in the proximal transverse colon secondary to the known  adenocarcinoma compared with CT 10/14/2023. 2. Increased wall thickening of the cecum and ascending colon compared to 10/14/2023. There are few additional segments of wall thickening in the mid and distal small bowel. Findings are favored to represent nonspecific enterocolitis. 3. Question mild diverticulitis in the descending colon. 4. Aortic Atherosclerosis (ICD10-I70.0). Electronically Signed   By: Minerva Fester M.D.   On: 12/29/2023 19:18     Data Reviewed: Relevant notes from primary care and specialist visits, past discharge summaries as available in EHR, including Care Everywhere. Prior diagnostic testing as pertinent to current admission diagnoses Updated medications and problem lists for reconciliation ED course, including vitals, labs, imaging, treatment and response to treatment Triage notes, nursing and pharmacy notes and ED provider's notes Notable results as noted in HPI   Assessment and Plan: * Large bowel obstruction (HCC) Adenocarcinoma transverse colon, recently diagnosed Alex Soto not yet on treatment.  First visit oncologist was 2/26 Will keep n.p.o. as advised by surgeon, Dr. Maurine Minister Antiemetics, pain control Formal surgical consult  Electrolyte abnormality Hyponatremia and hypokalemia Replete and monitor  Dementia with behavioral disturbance (HCC) Delirium precautions  Type 2 diabetes mellitus (HCC) Sliding scale insulin coverage  HTN (hypertension), benign IV hydralazine as needed while n.p.o.     DVT prophylaxis: Lovenox  Consults: Surgery, Dr. Maurine Minister  Advance Care Planning:   Code Status: Prior   Family Communication: none  Disposition Plan: Back to previous home environment  Severity of Illness: The appropriate Alex Soto status for this Alex Soto is OBSERVATION. Observation status is judged to be reasonable and necessary in order to provide the required intensity of service to ensure the Alex Soto's safety. The Alex Soto's presenting symptoms, physical  exam findings, and initial radiographic and laboratory data in the context of their medical condition is felt to place them at decreased risk for further clinical deterioration. Furthermore, it is anticipated that the Alex Soto will be medically stable for discharge from the hospital within 2 midnights of admission.   Author: Andris Baumann, MD 12/29/2023 9:43 PM  For on call review [email or web address].

## 2023-12-29 NOTE — ED Triage Notes (Addendum)
 Pt here from the cancer center with possible obstruction. Pt had an abd tumor currently. Pt endorses N/V and belching. Pt has not eaten since Wed. Pt had a fall on Tuesday tripping over his rug, denies hitting his head. Pt has a hx of dementia.

## 2023-12-29 NOTE — Assessment & Plan Note (Signed)
 Hyponatremia and hypokalemia Replete and monitor

## 2023-12-30 ENCOUNTER — Inpatient Hospital Stay

## 2023-12-30 ENCOUNTER — Encounter: Payer: Self-pay | Admitting: Internal Medicine

## 2023-12-30 DIAGNOSIS — K6389 Other specified diseases of intestine: Secondary | ICD-10-CM

## 2023-12-30 DIAGNOSIS — K56609 Unspecified intestinal obstruction, unspecified as to partial versus complete obstruction: Secondary | ICD-10-CM | POA: Diagnosis not present

## 2023-12-30 DIAGNOSIS — C189 Malignant neoplasm of colon, unspecified: Secondary | ICD-10-CM

## 2023-12-30 LAB — GLUCOSE, CAPILLARY
Glucose-Capillary: 104 mg/dL — ABNORMAL HIGH (ref 70–99)
Glucose-Capillary: 118 mg/dL — ABNORMAL HIGH (ref 70–99)
Glucose-Capillary: 120 mg/dL — ABNORMAL HIGH (ref 70–99)
Glucose-Capillary: 126 mg/dL — ABNORMAL HIGH (ref 70–99)
Glucose-Capillary: 79 mg/dL (ref 70–99)
Glucose-Capillary: 80 mg/dL (ref 70–99)
Glucose-Capillary: 93 mg/dL (ref 70–99)

## 2023-12-30 LAB — BASIC METABOLIC PANEL
Anion gap: 8 (ref 5–15)
BUN: 25 mg/dL — ABNORMAL HIGH (ref 8–23)
CO2: 26 mmol/L (ref 22–32)
Calcium: 7.9 mg/dL — ABNORMAL LOW (ref 8.9–10.3)
Chloride: 100 mmol/L (ref 98–111)
Creatinine, Ser: 0.75 mg/dL (ref 0.61–1.24)
GFR, Estimated: >60 mL/min (ref >60)
Glucose, Bld: 114 mg/dL — ABNORMAL HIGH (ref 70–99)
Potassium: 2.9 mmol/L — ABNORMAL LOW (ref 3.5–5.1)
Sodium: 134 mmol/L — ABNORMAL LOW (ref 135–145)

## 2023-12-30 LAB — CBC
HCT: 27.7 % — ABNORMAL LOW (ref 39.0–52.0)
Hemoglobin: 9 g/dL — ABNORMAL LOW (ref 13.0–17.0)
MCH: 27.9 pg (ref 26.0–34.0)
MCHC: 32.5 g/dL (ref 30.0–36.0)
MCV: 85.8 fL (ref 80.0–100.0)
Platelets: 244 10*3/uL (ref 150–400)
RBC: 3.23 MIL/uL — ABNORMAL LOW (ref 4.22–5.81)
RDW: 15.3 % (ref 11.5–15.5)
WBC: 2.9 10*3/uL — ABNORMAL LOW (ref 4.0–10.5)
nRBC: 0 % (ref 0.0–0.2)

## 2023-12-30 LAB — HEMOGLOBIN A1C
Hgb A1c MFr Bld: 6.9 % — ABNORMAL HIGH (ref 4.8–5.6)
Mean Plasma Glucose: 151.33 mg/dL

## 2023-12-30 LAB — MAGNESIUM: Magnesium: 1.8 mg/dL (ref 1.7–2.4)

## 2023-12-30 LAB — PHOSPHORUS: Phosphorus: 2.5 mg/dL (ref 2.5–4.6)

## 2023-12-30 MED ORDER — POTASSIUM CHLORIDE IN NACL 40-0.9 MEQ/L-% IV SOLN
INTRAVENOUS | Status: DC
Start: 2023-12-30 — End: 2023-12-31
  Filled 2023-12-30 (×4): qty 1000

## 2023-12-30 MED ORDER — METRONIDAZOLE 500 MG PO TABS
1000.0000 mg | ORAL_TABLET | Freq: Once | ORAL | Status: DC
  Filled 2023-12-30: qty 2

## 2023-12-30 MED ORDER — POLYETHYLENE GLYCOL 3350 17 GM/SCOOP PO POWD
0.5000 | Freq: Once | ORAL | Status: DC
  Filled 2023-12-30: qty 238

## 2023-12-30 MED ORDER — NEOMYCIN SULFATE 500 MG PO TABS
1000.0000 mg | ORAL_TABLET | Freq: Once | ORAL | Status: DC
  Filled 2023-12-30: qty 2

## 2023-12-30 MED ORDER — METRONIDAZOLE 500 MG PO TABS: 1000.0000 mg | ORAL_TABLET | Freq: Once | ORAL | Status: DC

## 2023-12-30 MED ORDER — NEOMYCIN SULFATE 500 MG PO TABS: 1000.0000 mg | ORAL_TABLET | Freq: Once | ORAL | Status: DC

## 2023-12-30 MED ORDER — POLYETHYLENE GLYCOL 3350 17 GM/SCOOP PO POWD
0.5000 | Freq: Once | ORAL | Status: AC
  Administered 2023-12-30: 127.5 g via ORAL
  Filled 2023-12-30: qty 238

## 2023-12-30 MED ORDER — SODIUM CHLORIDE 0.9 % IV SOLN
1.0000 g | Freq: Once | INTRAVENOUS | Status: AC
  Administered 2023-12-31: 1 g via INTRAVENOUS
  Filled 2023-12-30: qty 1000
  Filled 2023-12-30: qty 1

## 2023-12-30 MED ORDER — NEOMYCIN SULFATE 500 MG PO TABS
1000.0000 mg | ORAL_TABLET | Freq: Once | ORAL | Status: DC
Start: 2023-12-31 — End: 2023-12-30

## 2023-12-30 NOTE — Plan of Care (Signed)
  Problem: Coping: Goal: Ability to adjust to condition or change in health will improve Outcome: Progressing   Problem: Fluid Volume: Goal: Ability to maintain a balanced intake and output will improve Outcome: Progressing   Problem: Health Behavior/Discharge Planning: Goal: Ability to identify and utilize available resources and services will improve Outcome: Progressing Goal: Ability to manage health-related needs will improve Outcome: Progressing   Problem: Metabolic: Goal: Ability to maintain appropriate glucose levels will improve Outcome: Progressing   Problem: Skin Integrity: Goal: Risk for impaired skin integrity will decrease Outcome: Progressing   Problem: Tissue Perfusion: Goal: Adequacy of tissue perfusion will improve Outcome: Progressing   Problem: Education: Goal: Knowledge of General Education information will improve Description: Including pain rating scale, medication(s)/side effects and non-pharmacologic comfort measures Outcome: Progressing

## 2023-12-30 NOTE — Consult Note (Signed)
 Patient ID: Alex Rho., male   DOB: 1945-12-28, 78 y.o.   MRN: [id number] CC: Transverse Colon Mass History of Present Illness Alex Soto. is a 78 y.o. male with PMH of PVD, Lewy Body Dementia, DM and iron deficiency anemia who presents in consultation for transverse colon mass.  History is gathered from the patient as well as his son and wife.  The patient reports that he has had right-sided abdominal pain for several months.  He was also noted to have iron deficiency anemia and for this he underwent a colonoscopy on 19 February.  This was significant for an obstructing lesion in the proximal transverse colon.  He reports that he tolerated this prep well and the colonoscopy report also says that the prep was good.  His wife reports that he had a large amount of bowel movements with the prep and did not have any nausea and vomiting.  He reports on Thursday he started to have worsening right upper quadrant and right sided abdominal pain associated with some nausea and vomiting.  He says that yesterday he had a bowel movement and presented to his oncologist for evaluation.  There due to his pain and his history of nausea and vomiting they were concern for complete obstruction so sent him to the emergency department.  In the emergency department he had a CT scan that was concerning for obstruction and surgical team was consulted.  On exam the patient reports that he feels well.  He denies any nausea or feeling bloated.  He does report some right-sided abdominal pain.  He reports that he had a bowel movement yesterday although this cannot be corroborated by family.  After my exam he did have passage of flatus that was witnessed by the nursing staff as well as the family.  He has never had a colonoscopy before this 1.  He denies any blood in his stool but does report significant weight loss over the last several months.  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  Only wears upper       Past Surgical History:  Procedure Laterality Date   AMPUTATION TOE Right 03/01/2020   Procedure: AMPUTATION TOE;  Surgeon: [name], DPM;  Location: [facility];  Service: Podiatry;  Laterality: Right;   AMPUTATION TOE Right 01/03/2023   Procedure: AMPUTATION TOE, 2nd right;  Surgeon: [name], DPM;  Location: [facility];  Service: Podiatry;  Laterality: Right;   APPENDECTOMY     BIOPSY  12/20/2023   Procedure: BIOPSY;  Surgeon: Norma Fredrickson, Boykin Nearing, MD;  Location: [facility];  Service: Gastroenterology;;   BONE EXCISION Right 01/03/2023   Procedure: Exostectomy interphalangeal joint right great toe;  Surgeon: Ether Griffins,  Jill Alexanders, DPM;  Location: [facility];  Service: Podiatry;  Laterality: Right;   CATARACT EXTRACTION W/PHACO Left 09/06/2021   Procedure: CATARACT EXTRACTION PHACO AND INTRAOCULAR LENS PLACEMENT (IOC) LEFT 2.71 00:30.6;  Surgeon: Nevada Crane, MD;  Location: [facility];  Service: Ophthalmology;  Laterality: Left;  Diabetic   COLONOSCOPY WITH PROPOFOL N/A 12/20/2023   Procedure: COLONOSCOPY WITH PROPOFOL;  Surgeon: Toledo, Boykin Nearing, MD;  Location: [facility] ENDOSCOPY;  Service: Gastroenterology;  Laterality: N/A;   ESOPHAGOGASTRODUODENOSCOPY (EGD) WITH PROPOFOL N/A 12/20/2023   Procedure:  ESOPHAGOGASTRODUODENOSCOPY (EGD) WITH PROPOFOL;  Surgeon: Toledo, Boykin Nearing, MD;  Location: [facility] ENDOSCOPY;  Service: Gastroenterology;  Laterality: N/A;   EYE SURGERY     age 15   HERNIA REPAIR     INSERTION OF MESH  06/23/2023   Procedure: INSERTION OF MESH;  Surgeon: Sung Amabile, DO;  Location: [facility];  Service: General;;   KIDNEY STONE SURGERY     REMOVAL RETAINED LENS Right 04/19/2021   Procedure: CATARACT EXTRACTION PHACO,  RIGHT VISION BLUE 0.20 00:01.4 case aborted;  Surgeon: Nevada Crane, MD;  Location: [facility];  Service: Ophthalmology;  Laterality: Right;  Diabetic - oral meds   SHOULDER ARTHROSCOPY WITH BICEPS TENDON REPAIR Right 10/06/2016   Procedure: SHOULDER ARTHROSCOPY WITH BICEPS TENDON REPAIR;  Surgeon: Christena Flake, MD;  Location: [facility];  Service: Orthopedics;  Laterality: Right;   SHOULDER ARTHROSCOPY WITH OPEN ROTATOR CUFF REPAIR Right 10/06/2016   Procedure: SHOULDER ARTHROSCOPY WITH OPEN ROTATOR CUFF REPAIR;  Surgeon: Christena Flake, MD;  Location: [facility];  Service: Orthopedics;  Laterality: Right;   SHOULDER ARTHROSCOPY WITH SUBACROMIAL DECOMPRESSION Right 10/06/2016   Procedure: SHOULDER ARTHROSCOPY WITH SUBACROMIAL DECOMPRESSION AND DEBRIDEMENT;  Surgeon: Christena Flake, MD;  Location: [facility];  Service: Orthopedics;  Laterality: Right;   SUBMUCOSAL TATTOO INJECTION  12/20/2023   Procedure: SUBMUCOSAL TATTOO INJECTION;  Surgeon: Toledo, Boykin Nearing, MD;  Location: [facility] ENDOSCOPY;  Service: Gastroenterology;;   TONSILLECTOMY      No Known Allergies  Current Facility-Administered Medications  Medication Dose Route Frequency Provider Last Rate Last Admin   0.9 % NaCl with KCl 40 mEq / L  infusion   Intravenous Continuous Lurene Shadow, MD 100 mL/hr at 12/30/23 0945 New Bag at 12/30/23 0945   acetaminophen (TYLENOL) tablet 650 mg  650 mg Oral Q6H PRN Andris Baumann, MD       Or   acetaminophen (TYLENOL) suppository 650 mg  650 mg Rectal Q6H PRN  Andris Baumann, MD       atorvastatin (LIPITOR) tablet 40 mg  40 mg Oral BID Lindajo Royal V, MD   40 mg at 12/30/23 0931   hydrALAZINE (APRESOLINE) injection 5 mg  5 mg Intravenous Q6H PRN Andris Baumann, MD       insulin aspart (novoLOG) injection 0-15 Units  0-15 Units Subcutaneous Q4H Andris Baumann, MD   2 Units at 12/30/23 0981   metroNIDAZOLE (FLAGYL) tablet 1,000 mg  1,000 mg Oral Once Kandis Cocking, MD       metroNIDAZOLE (FLAGYL) tablet 1,000 mg  1,000 mg Oral Once Kandis Cocking, MD       metroNIDAZOLE (FLAGYL) tablet 1,000 mg  1,000 mg Oral Once Kandis Cocking, MD       morphine (PF) 2 MG/ML injection 2 mg  2 mg Intravenous Q2H PRN Andris Baumann, MD   2 mg at 12/30/23 0036   neomycin (MYCIFRADIN) tablet 1,000 mg  1,000 mg Oral Once Kandis Cocking, MD       neomycin [facility]) tablet 1,000 mg  1,000 mg Oral Once Kandis Cocking, MD       neomycin [facility]) tablet 1,000 mg  1,000 mg Oral Once Kandis Cocking, MD       ondansetron [facility]) tablet 4 mg  4 mg Oral Q6H PRN Andris Baumann, MD       Or   ondansetron [facility]) injection 4 mg  4 mg Intravenous Q6H PRN Andris Baumann, MD       polyethylene glycol powder (GLYCOLAX/MIRALAX) container 127.5 g  0.5 Container Oral Once Kandis Cocking, MD       [START ON 12/31/2023] polyethylene glycol powder (GLYCOLAX/MIRALAX) container 127.5 g  0.5 Container Oral Once Kandis Cocking, MD       rivastigmine (EXELON) capsule 3 mg  3 mg Oral BID Andris Baumann, MD   3 mg at 12/30/23 1610    Family History Family History  Family history unknown: Yes       Social History Social History   Tobacco Use   Smoking status: Former    Current packs/day: 0.00    Average packs/day: 0.3 packs/day for 10.0 years (2.5 ttl pk-yrs)    Types: Cigarettes    Start date: 10/01/1959    Quit date: 09/30/1969    Years since quitting: 54.2    Passive exposure: Past   Smokeless tobacco: Former  Building services engineer status: Never Used   Substance Use Topics   Alcohol use: No   Drug use: No        ROS Full ROS of systems performed and is otherwise negative there than what is stated in the HPI  Physical Exam Blood pressure 126/68, pulse (!) 54, temperature 99.3 F (37.4 C), temperature source Oral, resp. rate 16, height 6\' 1"  (1.854 m), weight 69.2 kg, SpO2 99%.  The patient is alert to person, place but he believes it is in the 90s.  He understands that he is here for a cancer diagnosis.  He has normal work of breathing on room air and is clear to auscultation bilaterally, regular rate and rhythm, abdomen is soft, some tenderness in the right upper quadrant and right lower quadrant without rebound tenderness or guarding.  He has some mild abdominal distention.  His robotic port sites are well-healed.  Data Reviewed I reviewed his CT scan from December there is dilated cecum and ascending bowel.  At that time there is noted to be no mass and there was stool within the distal colon.  I then reviewed his colonoscopy report from February 19.  There is a large hepatic flexure mass that was not traversed.  The pathology of this was consistent with adenocarcinoma.  The bowel prep does note that the colon was well-prepped.  I then reviewed his CT scan from yesterday.  There continues to be dilated proximal colon that looks slightly more thickened than in December.  There is also some dilated small.  Stomach does not appear dilated on his CT scan.  His labs are notable for anemia to a hemoglobin of 9.0.  He has an elevated CEA.  He is hyponatremic and hypokalemic but is not alkalotic.  His albumin was 3.5 yesterday.  I have personally reviewed the patient's imaging and medical records.    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  He  had a bowel movement reported by him yesterday and has been passing flatus today.  He was able to tolerate a bowel prep 10  days ago and it was commented on that the bowel prep was good.  Plan    Transverse Colon Adenocarcinoma  I had a long and very frank discussion with the patient, his wife and his son about the imaging findings concerning for obstructing colon cancer.  I discussed with him that he will need surgery it just depends on the timing.  Given that he is passing gas today and having bowel movements yesterday I think it is reasonable to try a slow prep in anticipation of surgery.  Also if we were able to do a prep I think that it would be safe to do this minimally invasive.  I discussed with the family that we will try to do this over 2 days.  If he gets increased nausea, abdominal pain or bloating with the prep we will stop and then we will need to proceed with surgery for an open right hemicolectomy.  I briefly discussed the risk, benefits of the surgery but plan to talk with him again about what all surgery would entail when we have a better idea of how well he tolerates this prep.  For now we will allow him sips with meds and just the prep.  If he tolerates this today he can potentially have some clear liquid diet prior to surgery on Monday.  Obviously, if he is unable to tolerate the prep today he will need to proceed with surgery and we will keep him n.p.o.  Iron Deficiency Anemia His iron deficiency anemia secondary to colon mass most likely.  We will get labs in the morning.  I will also get a type and screen in the morning.  When we proceed with surgery we will make sure that we have a type and cross for him in case additional blood is needed.   A total of 80 minutes was spent with the patient and his family reviewing his past medical history, current condition, labs and imaging and devising an appropriate plan for the patient.   Kandis Cocking 12/30/2023, 11:36 AM

## 2023-12-30 NOTE — Progress Notes (Signed)
 Updated note  I returned to bedside multiple times today to check in on patient.  He reports that he drank about a quarter of the full prep.  He says he has not had any bowel movements and his family corroborates this.  The family also reports that he has had multiple episodes of hiccuping although he denies any nausea or bloating.  On exam he does seem to be more distended than when I saw him this morning.  I got a KUB that showed dilated loops of small bowel.  While there is some air in the rectum I do not see a lot of air throughout the colon.  Given his increased distention and hiccuping I am concerned that he is obstructed.  He has no signs of peritonitis at this time and his do not see a very enlarged cecum on his KUB.  I personally placed the NG tube for decompression.  Will plan to decompress him overnight and go to the operating room tomorrow for an exploratory laparotomy with right colectomy.  I discussed with the patient and his family the risk of infection, bleeding, anastomotic leak, injury to the small bowel colon and ureter.  I also discussed with them that he may need an ileostomy but I am not planning to perform 1.  His family understands these risks and I talked with his wife on the phone about this.  His wife consents to the procedure and says that her son's can sign consent for the operating room.  His potassium was 2.9 today so we will repeat it tomorrow.  An EKG was obtained as he was sinus bradycardic.  I have also ensure that we have a type and screen tomorrow and will set up 2 units in case we needed due to his iron deficiency anemia.  We will give him a ertapenem in the morning prior to the procedure.

## 2023-12-30 NOTE — Progress Notes (Signed)
**Note Alex-Identified via Obfuscation**  Progress Note    Alex COLGAN Sr.  ZOX:[id number] DOB: [birth year]  DOA: 12/29/2023 PCP: [name], MD      Brief Narrative:    Medical records reviewed and are as summarized below:  Alex Burrs. is a 78 y.o. male with medical history significant for DM, HTN, Lewy body dementia, recently diagnosed adenocarcinoma of the colon on colonoscopy 12/20/2023 that was done for iron deficiency anemia and abdominal pain.  She saw her oncologist on 12/27/2023 to discuss therapy for newly diagnosed colon cancer.  He presented to the hospital with nausea, vomiting and worsening right lower quadrant abdominal pain.  He was found to have large bowel obstruction.     Assessment/Plan:   Principal Problem:   Large bowel obstruction (HCC) Active Problems:   HTN (hypertension), benign   Type 2 diabetes mellitus (HCC)   Malignant neoplasm of transverse colon (HCC)   Dementia with behavioral disturbance (HCC)   Electrolyte abnormality   Body mass index is 20.13 kg/m.   Large bowel obstruction, recently diagnosed transverse colon adenocarcinoma: Keep NPO.  Continue IV fluids for hydration.  Analgesics as needed for pain.  Antiemetics as needed for nausea/vomiting.  Follow-up with general surgeon for further recommendations.   Hypokalemia: Potassium trended down from 3.2-2.9.  Change IV normal saline to IV normal saline with 40 mEq of potassium chloride infusion. Hyponatremia: Continue IV fluids Monitor electrolytes and replete as needed   Type II DM: NovoLog correction scale as needed for hyperglycemia   Hypertension: BP is stable.  Use IV hydralazine as needed for severe hypertension   Lewy body dementia: Stable no acute issues.   Diet Order             Diet NPO time specified Except for: Other (See Comments), Sips with Meds  Diet effective now                            Consultants: General surgeon  Procedures: None    Medications:     atorvastatin  40 mg Oral BID   insulin aspart  0-15 Units Subcutaneous Q4H   [START ON 12/31/2023] metroNIDAZOLE  1,000 mg Oral Once   [START ON 12/31/2023] metroNIDAZOLE  1,000 mg Oral Once   [START ON 12/31/2023] metroNIDAZOLE  1,000 mg Oral Once   [START ON 12/31/2023] neomycin  1,000 mg Oral Once   [START ON 12/31/2023] neomycin  1,000 mg Oral Once   [START ON 12/31/2023] neomycin  1,000 mg Oral Once   polyethylene glycol powder  0.5 Container Oral Once   [START ON 12/31/2023] polyethylene glycol powder  0.5 Container Oral Once   rivastigmine  3 mg Oral BID   Continuous Infusions:  0.9 % NaCl with KCl 40 mEq / L 100 mL/hr at 12/30/23 0945     Anti-infectives (From admission, onward)    Start     Dose/Rate Route Frequency Ordered Stop   12/31/23 2200  metroNIDAZOLE (FLAGYL) tablet 1,000 mg        1,000 mg Oral  Once 12/30/23 1138     12/31/23 2200  neomycin (MYCIFRADIN) tablet 1,000 mg        1,000 mg Oral  Once 12/30/23 1138     12/31/23 1500  metroNIDAZOLE (FLAGYL) tablet 1,000 mg        1,000 mg Oral  Once 12/30/23 1138     12/31/23 1500  neomycin (MYCIFRADIN) tablet 1,000 mg  1,000 mg Oral  Once 12/30/23 1138     12/31/23 1400  metroNIDAZOLE (FLAGYL) tablet 1,000 mg        1,000 mg Oral  Once 12/30/23 1138     12/31/23 1400  neomycin (MYCIFRADIN) tablet 1,000 mg        1,000 mg Oral  Once 12/30/23 1138     12/30/23 2200  neomycin (MYCIFRADIN) tablet 1,000 mg  Status:  Discontinued        1,000 mg Oral  Once 12/30/23 0915 12/30/23 1138   12/30/23 2200  metroNIDAZOLE (FLAGYL) tablet 1,000 mg  Status:  Discontinued        1,000 mg Oral  Once 12/30/23 0915 12/30/23 1138   12/30/23 1500  neomycin (MYCIFRADIN) tablet 1,000 mg  Status:  Discontinued        1,000 mg Oral  Once 12/30/23 0915 12/30/23 1138   12/30/23 1500  metroNIDAZOLE (FLAGYL) tablet 1,000 mg  Status:  Discontinued        1,000 mg Oral  Once 12/30/23 0915 12/30/23 1138   12/30/23 1400  neomycin (MYCIFRADIN) tablet  1,000 mg  Status:  Discontinued        1,000 mg Oral  Once 12/30/23 0915 12/30/23 1138   12/30/23 1400  metroNIDAZOLE (FLAGYL) tablet 1,000 mg  Status:  Discontinued        1,000 mg Oral  Once 12/30/23 0915 12/30/23 1138              Family Communication/Anticipated D/C date and plan/Code Status   DVT prophylaxis: SCDs Start: 12/29/23 2147     Code Status: Full Code  Family Communication: Plan discussed with his wife and Alex Soto (son) at the bedside Disposition Plan: Plan to discharge home   Status is: Inpatient Remains inpatient appropriate because: Bowel obstruction       Subjective:   No events noted.  He complains of abdominal pain graded as 7/10 in severity.  No vomiting today.  His wife and Alex Soto (son) were at the bedside.  Objective:    Vitals:   12/30/23 0010 12/30/23 0028 12/30/23 0352 12/30/23 0833  BP: (!) 153/78  (!) 129/55 126/68  Pulse: (!) 53  (!) 56 (!) 54  Resp: 18  17 16   Temp: 99.2 F (37.3 C)  98.3 F (36.8 C) 99.3 F (37.4 C)  TempSrc: Oral  Oral Oral  SpO2: 96%  100% 99%  Weight:  69.2 kg    Height:  6\' 1"  (1.854 m)     No data found.   Intake/Output Summary (Last 24 hours) at 12/30/2023 1158 Last data filed at 12/30/2023 0616 Gross per 24 hour  Intake 227.47 ml  Output --  Net 227.47 ml   Filed Weights   12/29/23 1418 12/30/23 0028  Weight: 70.8 kg 69.2 kg    Exam:  GEN: NAD SKIN: Warm and dry EYES: No pallor or icterus ENT: MMM CV: RRR PULM: CTA B ABD: soft, ND, mid abdominal tenderness without rebound tenderness or guarding, +BS CNS: AAO x 3, non focal EXT: No edema or tenderness        Data Reviewed:   I have personally reviewed following labs and imaging studies:  Labs: Labs show the following:   Basic Metabolic Panel: Recent Labs  Lab 12/27/23 1515 12/29/23 1420 12/30/23 0607  NA 132* 130* 134*  K 3.9 3.2* 2.9*  CL 94* 91* 100  CO2 26 26 26   GLUCOSE 190* 194* 114*  BUN 21 29* 25*   CREATININE  0.79 0.78 0.75  CALCIUM 9.2 8.4* 7.9*  MG  --   --  1.8  PHOS  --   --  2.5   GFR Estimated Creatinine Clearance: 75.7 mL/min (by C-G formula based on SCr of 0.75 mg/dL). Liver Function Tests: Recent Labs  Lab 12/27/23 1515 12/29/23 1420  AST 19 17  ALT 17 15  ALKPHOS 93 91  BILITOT 1.0 1.1  PROT 7.3 6.7  ALBUMIN 4.0 3.5   Recent Labs  Lab 12/29/23 1420  LIPASE 20   No results for input(s): "AMMONIA" in the last 168 hours. Coagulation profile No results for input(s): "INR", "PROTIME" in the last 168 hours.  CBC: Recent Labs  Lab 12/27/23 1515 12/29/23 1420 12/30/23 0607  WBC 5.1 3.8* 2.9*  NEUTROABS 4.0  --   --   HGB 10.8* 10.7* 9.0*  HCT 33.6* 32.2* 27.7*  MCV 85.7 85.0 85.8  PLT 271 299 244   Cardiac Enzymes: No results for input(s): "CKTOTAL", "CKMB", "CKMBINDEX", "TROPONINI" in the last 168 hours. BNP (last 3 results) No results for input(s): "PROBNP" in the last 8760 hours. CBG: Recent Labs  Lab 12/29/23 2236 12/30/23 0022 12/30/23 0348 12/30/23 0829 12/30/23 1125  GLUCAP 126* 80 120* 126* 79   D-Dimer: No results for input(s): "DDIMER" in the last 72 hours. Hgb A1c: No results for input(s): "HGBA1C" in the last 72 hours. Lipid Profile: No results for input(s): "CHOL", "HDL", "LDLCALC", "TRIG", "CHOLHDL", "LDLDIRECT" in the last 72 hours. Thyroid function studies: No results for input(s): "TSH", "T4TOTAL", "T3FREE", "THYROIDAB" in the last 72 hours.  Invalid input(s): "FREET3" Anemia work up: Recent Labs    12/27/23 1515  FERRITIN 10*  TIBC 277  IRON 48   Sepsis Labs: Recent Labs  Lab 12/27/23 1515 12/29/23 1420 12/30/23 0607  WBC 5.1 3.8* 2.9*    Microbiology No results found for this or any previous visit (from the past 240 hours).  Procedures and diagnostic studies:  CT ABDOMEN PELVIS W CONTRAST Result Date: 12/29/2023 CLINICAL DATA:  Nausea, vomiting, generalized abdominal pain for 1-2 days. Recent  diagnosis of colon cancer. Rule out obstruction. EXAM: CT ABDOMEN AND PELVIS WITH CONTRAST TECHNIQUE: Multidetector CT imaging of the abdomen and pelvis was performed using the standard protocol following bolus administration of intravenous contrast. RADIATION DOSE REDUCTION: This exam was performed according to the departmental dose-optimization program which includes automated exposure control, adjustment of the mA and/or kV according to patient size and/or use of iterative reconstruction technique. CONTRAST:  OMNIPAQUE IOHEXOL 300 MG/ML  SOLN COMPARISON:  CT abdomen and pelvis 10/14/2023 FINDINGS: Lower chest: No acute abnormality. Hepatobiliary: No focal hepatic lesion. Unremarkable gallbladder and biliary tree. Pancreas: Unremarkable. Spleen: Unremarkable. Adrenals/Urinary Tract: Stable adrenal glands. No urinary calculi or hydronephrosis. Unremarkable bladder. Stomach/Bowel: Stomach is within normal limits. Fluid-filled mid and distal small bowel at the upper limits of normal in caliber and increased compared to 10/14/2023. The cecum and ascending colon are fluid-filled with abrupt transition point in the proximal transverse colon at the site of the mass seen on colonoscopy 12/20/2023. There is increased wall thickening of the cecum and ascending colon compared to 10/14/2023. There are few additional segments of wall thickening in the mid and distal small bowel. Diverticulosis in the descending colon. Trace adjacent stranding and fluid. Vascular/Lymphatic: Aortic atherosclerosis. No enlarged abdominal or pelvic lymph nodes. Reproductive: No acute abnormality. Other: No free intraperitoneal air. Musculoskeletal: No acute fracture.  No destructive osseous lesion. IMPRESSION: 1. Worsening obstruction in the proximal  transverse colon secondary to the known adenocarcinoma compared with CT 10/14/2023. 2. Increased wall thickening of the cecum and ascending colon compared to 10/14/2023. There are few additional  segments of wall thickening in the mid and distal small bowel. Findings are favored to represent nonspecific enterocolitis. 3. Question mild diverticulitis in the descending colon. 4. Aortic Atherosclerosis (ICD10-I70.0). Electronically Signed   By: Minerva Fester M.D.   On: 12/29/2023 19:18               LOS: 1 day   Sahvannah Rieser  [facility]   Pager on [email or web address]. If 7PM-7AM, please contact night-coverage at [email or web address]     12/30/2023, 11:58 AM

## 2023-12-30 NOTE — IPAL (Signed)
  Interdisciplinary Goals of Care Family Meeting   Date carried out: 12/30/2023  Location of the meeting: Bedside  Member's involved: Physician and Family Member or next of kin  Durable Power of Attorney or acting medical decision maker: wife and son, Ethelene Browns    Discussion: We discussed goals of care for AMELIA MACKEN Sr. .   I have reviewed medical records including EPIC notes, labs and imaging, assessed the patient  to discuss major active diagnoses, plan of care, natural trajectory, prognosis, GOC, EOL wishes, disposition and options including Full code/DNI/DNR and the concept of comfort care if DNR is elected. Questions and concerns were addressed. They are  in agreement to continue current plan of care . Election for full code pending palliative care consult   Code status:   Code Status: Full Code   Disposition: Continue current acute care  Time spent for the meeting: 30    Andris Baumann, MD  12/30/2023, 2:24 AM

## 2023-12-30 NOTE — Progress Notes (Signed)
   12/30/23 1100  Spiritual Encounters  Type of Visit Initial  Care provided to: Pt and family  Referral source Nurse (RN/NT/LPN)  Reason for visit Advance directives  OnCall Visit Yes   Chaplain responded to [facility] consult to provide information for AD. According to wife and son who were at beside, patient suffers from dementia and and is unable to make decisions for himself.

## 2023-12-30 DEATH — deceased

## 2023-12-31 ENCOUNTER — Other Ambulatory Visit: Payer: Self-pay

## 2023-12-31 ENCOUNTER — Inpatient Hospital Stay: Payer: Self-pay | Admitting: Anesthesiology

## 2023-12-31 ENCOUNTER — Encounter: Admission: EM | Disposition: E | Payer: Self-pay | Source: Ambulatory Visit | Attending: Internal Medicine

## 2023-12-31 DIAGNOSIS — K56609 Unspecified intestinal obstruction, unspecified as to partial versus complete obstruction: Secondary | ICD-10-CM | POA: Diagnosis not present

## 2023-12-31 DIAGNOSIS — K6389 Other specified diseases of intestine: Secondary | ICD-10-CM | POA: Diagnosis not present

## 2023-12-31 HISTORY — PX: LAPAROTOMY: SHX154

## 2023-12-31 LAB — POCT I-STAT, CHEM 8
BUN: 13 mg/dL (ref 8–23)
Calcium, Ion: 1.06 mmol/L — ABNORMAL LOW (ref 1.15–1.40)
Chloride: 98 mmol/L (ref 98–111)
Creatinine, Ser: 0.8 mg/dL (ref 0.61–1.24)
Glucose, Bld: 97 mg/dL (ref 70–99)
HCT: 27 % — ABNORMAL LOW (ref 39.0–52.0)
Hemoglobin: 9.2 g/dL — ABNORMAL LOW (ref 13.0–17.0)
Potassium: 3.7 mmol/L (ref 3.5–5.1)
Sodium: 135 mmol/L (ref 135–145)
TCO2: 23 mmol/L (ref 22–32)

## 2023-12-31 LAB — GLUCOSE, CAPILLARY
Glucose-Capillary: 122 mg/dL — ABNORMAL HIGH (ref 70–99)
Glucose-Capillary: 134 mg/dL — ABNORMAL HIGH (ref 70–99)
Glucose-Capillary: 135 mg/dL — ABNORMAL HIGH (ref 70–99)
Glucose-Capillary: 136 mg/dL — ABNORMAL HIGH (ref 70–99)
Glucose-Capillary: 84 mg/dL (ref 70–99)

## 2023-12-31 LAB — CBC WITH DIFFERENTIAL/PLATELET
Abs Immature Granulocytes: 0.04 10*3/uL (ref 0.00–0.07)
Basophils Absolute: 0 10*3/uL (ref 0.0–0.1)
Basophils Relative: 1 %
Eosinophils Absolute: 0 10*3/uL (ref 0.0–0.5)
Eosinophils Relative: 0 %
HCT: 28.5 % — ABNORMAL LOW (ref 39.0–52.0)
Hemoglobin: 9.3 g/dL — ABNORMAL LOW (ref 13.0–17.0)
Immature Granulocytes: 1 %
Lymphocytes Relative: 25 %
Lymphs Abs: 0.8 10*3/uL (ref 0.7–4.0)
MCH: 28.3 pg (ref 26.0–34.0)
MCHC: 32.6 g/dL (ref 30.0–36.0)
MCV: 86.6 fL (ref 80.0–100.0)
Monocytes Absolute: 0.5 10*3/uL (ref 0.1–1.0)
Monocytes Relative: 17 %
Neutro Abs: 1.8 10*3/uL (ref 1.7–7.7)
Neutrophils Relative %: 56 %
Platelets: 240 10*3/uL (ref 150–400)
RBC: 3.29 MIL/uL — ABNORMAL LOW (ref 4.22–5.81)
RDW: 15 % (ref 11.5–15.5)
WBC: 3.2 10*3/uL — ABNORMAL LOW (ref 4.0–10.5)
nRBC: 0 % (ref 0.0–0.2)

## 2023-12-31 LAB — MAGNESIUM: Magnesium: 1.9 mg/dL (ref 1.7–2.4)

## 2023-12-31 LAB — BASIC METABOLIC PANEL
Anion gap: 8 (ref 5–15)
BUN: 16 mg/dL (ref 8–23)
CO2: 25 mmol/L (ref 22–32)
Calcium: 7.9 mg/dL — ABNORMAL LOW (ref 8.9–10.3)
Chloride: 101 mmol/L (ref 98–111)
Creatinine, Ser: 0.72 mg/dL (ref 0.61–1.24)
GFR, Estimated: >60 mL/min (ref >60)
Glucose, Bld: 103 mg/dL — ABNORMAL HIGH (ref 70–99)
Potassium: 3.7 mmol/L (ref 3.5–5.1)
Sodium: 134 mmol/L — ABNORMAL LOW (ref 135–145)

## 2023-12-31 LAB — ABO/RH: ABO/RH(D): O POS

## 2023-12-31 LAB — PHOSPHORUS: Phosphorus: 2.4 mg/dL — ABNORMAL LOW (ref 2.5–4.6)

## 2023-12-31 SURGERY — LAPAROTOMY, EXPLORATORY
Anesthesia: General | Laterality: Right

## 2023-12-31 MED ORDER — SUGAMMADEX SODIUM 200 MG/2ML IV SOLN
INTRAVENOUS | Status: DC | PRN
  Administered 2023-12-31: 200 mg via INTRAVENOUS

## 2023-12-31 MED ORDER — FENTANYL CITRATE (PF) 100 MCG/2ML IJ SOLN
25.0000 ug | INTRAMUSCULAR | Status: DC | PRN
  Administered 2023-12-31: 50 ug via INTRAVENOUS

## 2023-12-31 MED ORDER — ENOXAPARIN SODIUM 30 MG/0.3ML IJ SOSY
30.0000 mg | PREFILLED_SYRINGE | INTRAMUSCULAR | Status: DC
  Administered 2023-12-31 – 2024-01-02 (×2): 30 mg via SUBCUTANEOUS
  Filled 2023-12-31 (×2): qty 0.3

## 2023-12-31 MED ORDER — DEXAMETHASONE SODIUM PHOSPHATE 10 MG/ML IJ SOLN
INTRAMUSCULAR | Status: DC | PRN
Start: 2023-12-31 — End: 2023-12-31
  Administered 2023-12-31: 10 mg via INTRAVENOUS

## 2023-12-31 MED ORDER — LACTATED RINGERS IV SOLN: INTRAVENOUS | Status: DC | PRN

## 2023-12-31 MED ORDER — PHENYLEPHRINE 80 MCG/ML (10ML) SYRINGE FOR IV PUSH (FOR BLOOD PRESSURE SUPPORT)
PREFILLED_SYRINGE | INTRAVENOUS | Status: AC
  Filled 2023-12-31: qty 10

## 2023-12-31 MED ORDER — FENTANYL CITRATE (PF) 100 MCG/2ML IJ SOLN
INTRAMUSCULAR | Status: AC
  Filled 2023-12-31: qty 2

## 2023-12-31 MED ORDER — BUPIVACAINE LIPOSOME 1.3 % IJ SUSP
INTRAMUSCULAR | Status: AC
  Filled 2023-12-31: qty 20

## 2023-12-31 MED ORDER — METHOCARBAMOL 1000 MG/10ML IJ SOLN
500.0000 mg | Freq: Three times a day (TID) | INTRAMUSCULAR | Status: DC | PRN
  Administered 2024-01-03: 500 mg via INTRAVENOUS
  Filled 2023-12-31 (×2): qty 5

## 2023-12-31 MED ORDER — KETAMINE HCL 50 MG/5ML IJ SOSY
PREFILLED_SYRINGE | INTRAMUSCULAR | Status: AC
  Filled 2023-12-31: qty 5

## 2023-12-31 MED ORDER — PROPOFOL 10 MG/ML IV BOLUS
INTRAVENOUS | Status: DC | PRN
  Administered 2023-12-31: 80 mg via INTRAVENOUS

## 2023-12-31 MED ORDER — SODIUM CHLORIDE (PF) 0.9 % IJ SOLN
INTRAMUSCULAR | Status: AC
  Filled 2023-12-31: qty 50

## 2023-12-31 MED ORDER — BUPIVACAINE HCL (PF) 0.5 % IJ SOLN
INTRAMUSCULAR | Status: AC
  Filled 2023-12-31: qty 30

## 2023-12-31 MED ORDER — ONDANSETRON HCL 4 MG/2ML IJ SOLN
INTRAMUSCULAR | Status: AC
  Filled 2023-12-31: qty 2

## 2023-12-31 MED ORDER — KETAMINE HCL 50 MG/5ML IJ SOSY
PREFILLED_SYRINGE | INTRAMUSCULAR | Status: DC | PRN
Start: 2023-12-31 — End: 2023-12-31
  Administered 2023-12-31: 10 mg via INTRAVENOUS

## 2023-12-31 MED ORDER — LIDOCAINE HCL (PF) 2 % IJ SOLN
INTRAMUSCULAR | Status: AC
Start: 2023-12-31 — End: ?
  Filled 2023-12-31: qty 5

## 2023-12-31 MED ORDER — FENTANYL CITRATE (PF) 100 MCG/2ML IJ SOLN
INTRAMUSCULAR | Status: DC | PRN
  Administered 2023-12-31: 50 ug via INTRAVENOUS
  Administered 2023-12-31 (×2): 25 ug via INTRAVENOUS

## 2023-12-31 MED ORDER — OXYCODONE HCL 5 MG/5ML PO SOLN: 5.0000 mg | Freq: Once | ORAL | Status: DC | PRN

## 2023-12-31 MED ORDER — EPHEDRINE 5 MG/ML INJ
INTRAVENOUS | Status: AC
Start: 2023-12-31 — End: ?
  Filled 2023-12-31: qty 5

## 2023-12-31 MED ORDER — LIDOCAINE HCL (CARDIAC) PF 100 MG/5ML IV SOSY
PREFILLED_SYRINGE | INTRAVENOUS | Status: DC | PRN
  Administered 2023-12-31: 80 mg via INTRAVENOUS

## 2023-12-31 MED ORDER — DEXAMETHASONE SODIUM PHOSPHATE 10 MG/ML IJ SOLN
INTRAMUSCULAR | Status: AC
  Filled 2023-12-31: qty 1

## 2023-12-31 MED ORDER — SUCCINYLCHOLINE CHLORIDE 200 MG/10ML IV SOSY
PREFILLED_SYRINGE | INTRAVENOUS | Status: AC
Start: 2023-12-31 — End: ?
  Filled 2023-12-31: qty 10

## 2023-12-31 MED ORDER — 0.9 % SODIUM CHLORIDE (POUR BTL) OPTIME
TOPICAL | Status: DC | PRN
  Administered 2023-12-31: 3000 mL

## 2023-12-31 MED ORDER — ALBUMIN HUMAN 5 % IV SOLN
INTRAVENOUS | Status: AC
  Filled 2023-12-31: qty 250

## 2023-12-31 MED ORDER — SUGAMMADEX SODIUM 200 MG/2ML IV SOLN
INTRAVENOUS | Status: AC
  Filled 2023-12-31: qty 2

## 2023-12-31 MED ORDER — SUCCINYLCHOLINE CHLORIDE 200 MG/10ML IV SOSY
PREFILLED_SYRINGE | INTRAVENOUS | Status: DC | PRN
  Administered 2023-12-31: 120 mg via INTRAVENOUS

## 2023-12-31 MED ORDER — ACETAMINOPHEN 10 MG/ML IV SOLN
INTRAVENOUS | Status: DC | PRN
  Administered 2023-12-31: 1000 mg via INTRAVENOUS

## 2023-12-31 MED ORDER — SODIUM CHLORIDE (PF) 0.9 % IJ SOLN
INTRAMUSCULAR | Status: DC | PRN
  Administered 2023-12-31: 70 mL via SUBCUTANEOUS

## 2023-12-31 MED ORDER — PIPERACILLIN-TAZOBACTAM 3.375 G IVPB
3.3750 g | Freq: Three times a day (TID) | INTRAVENOUS | Status: AC
  Administered 2024-01-01 (×3): 3.375 g via INTRAVENOUS
  Filled 2023-12-31 (×3): qty 50

## 2023-12-31 MED ORDER — BUPIVACAINE-EPINEPHRINE (PF) 0.5% -1:200000 IJ SOLN
INTRAMUSCULAR | Status: AC
  Filled 2023-12-31: qty 20

## 2023-12-31 MED ORDER — ACETAMINOPHEN 10 MG/ML IV SOLN
INTRAVENOUS | Status: AC
  Filled 2023-12-31: qty 100

## 2023-12-31 MED ORDER — HYDROMORPHONE HCL 1 MG/ML IJ SOLN
INTRAMUSCULAR | Status: AC
  Filled 2023-12-31: qty 1

## 2023-12-31 MED ORDER — ALBUMIN HUMAN 5 % IV SOLN: INTRAVENOUS | Status: DC | PRN

## 2023-12-31 MED ORDER — ROCURONIUM BROMIDE 10 MG/ML (PF) SYRINGE
PREFILLED_SYRINGE | INTRAVENOUS | Status: AC
  Filled 2023-12-31: qty 10

## 2023-12-31 MED ORDER — ROCURONIUM BROMIDE 100 MG/10ML IV SOLN
INTRAVENOUS | Status: DC | PRN
  Administered 2023-12-31 (×2): 20 mg via INTRAVENOUS
  Administered 2023-12-31: 50 mg via INTRAVENOUS

## 2023-12-31 MED ORDER — OXYCODONE HCL 5 MG PO TABS: 5.0000 mg | ORAL_TABLET | Freq: Once | ORAL | Status: DC | PRN

## 2023-12-31 MED ORDER — POTASSIUM CHLORIDE IN NACL 40-0.9 MEQ/L-% IV SOLN
INTRAVENOUS | Status: DC
  Filled 2023-12-31 (×3): qty 1000

## 2023-12-31 MED ORDER — DEXMEDETOMIDINE HCL IN NACL 80 MCG/20ML IV SOLN
INTRAVENOUS | Status: DC | PRN
  Administered 2023-12-31: 8 ug via INTRAVENOUS
  Administered 2023-12-31 (×3): 4 ug via INTRAVENOUS

## 2023-12-31 MED ORDER — PHENYLEPHRINE 80 MCG/ML (10ML) SYRINGE FOR IV PUSH (FOR BLOOD PRESSURE SUPPORT)
PREFILLED_SYRINGE | INTRAVENOUS | Status: DC | PRN
  Administered 2023-12-31: 80 ug via INTRAVENOUS
  Administered 2023-12-31 (×2): 160 ug via INTRAVENOUS
  Administered 2023-12-31: 120 ug via INTRAVENOUS

## 2023-12-31 MED ORDER — PROPOFOL 10 MG/ML IV BOLUS
INTRAVENOUS | Status: AC
  Filled 2023-12-31: qty 20

## 2023-12-31 MED ORDER — ONDANSETRON HCL 4 MG/2ML IJ SOLN: 4.0000 mg | INTRAMUSCULAR | Status: DC | PRN

## 2023-12-31 MED ORDER — HYDROMORPHONE HCL 1 MG/ML IJ SOLN
INTRAMUSCULAR | Status: DC | PRN
  Administered 2023-12-31: .5 mg via INTRAVENOUS
  Administered 2023-12-31: .25 mg via INTRAVENOUS

## 2023-12-31 SURGICAL SUPPLY — 48 items
ATTRACTOMAT 16X20 MAGNETIC DRP (DRAPES) IMPLANT
CHLORAPREP W/TINT 26 (MISCELLANEOUS) IMPLANT
DRAPE LAPAROTOMY 100X77 ABD (DRAPES) ×1 IMPLANT
DRAPE POUCH INSTRU U-SHP 10X18 (DRAPES) IMPLANT
DRSG OPSITE POSTOP 4X10 (GAUZE/BANDAGES/DRESSINGS) IMPLANT
DRSG OPSITE POSTOP 4X8 (GAUZE/BANDAGES/DRESSINGS) IMPLANT
ELECT BLADE 6.5 EXT (BLADE) ×1 IMPLANT
ELECT REM PT RETURN 9FT ADLT (ELECTROSURGICAL) ×1 IMPLANT
ELECTRODE REM PT RTRN 9FT ADLT (ELECTROSURGICAL) ×1 IMPLANT
GLOVE BIOGEL PI IND STRL 7.5 (GLOVE) ×1 IMPLANT
GLOVE SURG SYN 7.0 (GLOVE) ×1 IMPLANT
GLOVE SURG SYN 7.0 PF PI (GLOVE) ×1 IMPLANT
GOWN STRL REUS W/ TWL LRG LVL3 (GOWN DISPOSABLE) ×2 IMPLANT
HANDLE SUCTION POOLE (INSTRUMENTS) IMPLANT
KIT TURNOVER KIT A (KITS) ×1 IMPLANT
LABEL OR SOLS (LABEL) ×1 IMPLANT
LIGASURE IMPACT 36 18CM CVD LR (INSTRUMENTS) IMPLANT
MANIFOLD NEPTUNE II (INSTRUMENTS) ×1 IMPLANT
NDL HYPO 22X1.5 SAFETY MO (MISCELLANEOUS) ×1 IMPLANT
NEEDLE HYPO 22X1.5 SAFETY MO (MISCELLANEOUS) ×1 IMPLANT
NS IRRIG 1000ML POUR BTL (IV SOLUTION) ×1 IMPLANT
PACK BASIN MAJOR ARMC (MISCELLANEOUS) ×1 IMPLANT
PACK COLON CLEAN CLOSURE (MISCELLANEOUS) ×1 IMPLANT
RELOAD LINEAR CUT PROX 55 BLUE (ENDOMECHANICALS) IMPLANT
RELOAD PROXIMATE 75MM BLUE (ENDOMECHANICALS) ×2 IMPLANT
RELOAD STAPLE 55 3.8 BLU REG (ENDOMECHANICALS) IMPLANT
RELOAD STAPLE 75 3.8 BLU REG (ENDOMECHANICALS) IMPLANT
RETRACTOR WND ALEXIS-O 25 LRG (MISCELLANEOUS) IMPLANT
RETRACTOR WOUND ALXS 18CM MED (MISCELLANEOUS) IMPLANT
RETRACTOR WOUND ALXS 25CM LRG (MISCELLANEOUS) IMPLANT
RTRCTR WOUND ALEXIS O 18CM MED (MISCELLANEOUS) IMPLANT
RTRCTR WOUND ALEXIS O 25CM LRG (MISCELLANEOUS) IMPLANT
SPONGE T-LAP 18X18 ~~LOC~~+RFID (SPONGE) IMPLANT
STAPLER GUN LINEAR PROX 60 (STAPLE) IMPLANT
STAPLER PROXIMATE 75MM BLUE (STAPLE) IMPLANT
STAPLER SKIN PROX 35W (STAPLE) ×1 IMPLANT
SUCTION POOLE HANDLE (INSTRUMENTS) ×1 IMPLANT
SUT PDS AB 0 CT1 27 (SUTURE) IMPLANT
SUT SILK 2-0 18XBRD TIE 12 (SUTURE) IMPLANT
SUT SILK 3 0 SH CR/8 (SUTURE) IMPLANT
SUT STRATA PDS 2-0 23 CT-2.5 (SUTURE) ×2 IMPLANT
SUT VIC AB 3-0 SH 27X BRD (SUTURE) ×1 IMPLANT
SUTURE STRAT PDS 2-0 23 CT-2.5 (SUTURE) IMPLANT
SYR 20ML LL LF (SYRINGE) ×2 IMPLANT
TOWEL OR 17X26 4PK STRL BLUE (TOWEL DISPOSABLE) IMPLANT
TRAP FLUID SMOKE EVACUATOR (MISCELLANEOUS) ×1 IMPLANT
TRAY FOLEY MTR SLVR 16FR STAT (SET/KITS/TRAYS/PACK) ×1 IMPLANT
WATER STERILE IRR 500ML POUR (IV SOLUTION) ×1 IMPLANT

## 2023-12-31 NOTE — Transfer of Care (Signed)
 Immediate Anesthesia Transfer of Care Note  Patient: Alex BEICHNER Sr.  Procedure(s) Performed: EXPLORATORY LAPAROTOMY WITH RIGHT COLECTOMY (Right)  Patient Location: PACU  Anesthesia Type:General  Level of Consciousness: drowsy and patient cooperative  Airway & Oxygen Therapy: Patient Spontanous Breathing and Patient connected to face mask oxygen  Post-op Assessment: Report given to RN and Post -op Vital signs reviewed and stable  Post vital signs: Reviewed and stable  Last Vitals:  Vitals Value Taken Time  BP 139/67 12/31/23 1145  Temp    Pulse 90 12/31/23 1148  Resp 18 12/31/23 1148  SpO2 100 % 12/31/23 1148  Vitals shown include unfiled device data.  Last Pain:  Vitals:   12/31/23 0720  TempSrc:   PainSc: 0-No pain         Complications: No notable events documented.

## 2023-12-31 NOTE — Op Note (Signed)
 Operative note  Preoperative diagnosis: Obstructing right colon cancer Postoperative diagnosis obstructing hepatic flexure mass Surgeon: Baker Pierini, MD Procedure: Exploratory laparotomy right hemicolectomy with primary anastomosis. EBL: 200 cc  After informed consent was obtained the patient was brought to the operating room placed supine on the operating room table.  General endotracheal anesthesia was induced and his abdomen was then prepped and draped in the usual standard fashion. He had a Foley catheter placed.  Appropriate preoperative antibiotics were given and he received 1 g of Invanz.  The patient already had an NG tube given that he was obstructed.  A surgical timeout was called identifying correct patient, site, side and procedure.  A midline incision was made and taken down through the subcutaneous tissue with Bovie cautery.  The anterior fascia was identified and incised Bovie cautery.  The peritoneum was grasped and incised with Metzenbaum scissors.  The abdominal cavity was entered and the fascia was opened along the length of the incision.  Upon survey of the abdomen there was some ascites in the right lower quadrant.  I did not notice any evidence of metastatic disease and the liver looked normal without any masses or lesions.  The right colon was dilated and there were adhesions down in the right lower quadrant where he had a previous appendectomy.  The cecum was freed from the right lower quadrant by lysing adhesions.  I then identified the white line of Toldt with mobilized the right colon along the white line of Toldt up towards the hepatic flexure.  I identified the mass and observed the tattoo.  Once I was able to fully mobilize the hepatic flexure the transverse colon did appear to be stuck to the duodenum but there was no evidence of gross invasion of the duodenum by tumor.  The transverse colon was separated from the duodenum and the retroperitoneum was swept off of the  mesocolon.  Once the colon was fully mobilized I palpated the mass again and marked out approximately 5 cm distal to make my distal transection.  On palpation of the mesentery this was just proximal to the middle colic artery.  I made a hole in the mesocolon and stapled off the colon with a 75 mm blue load stapler.  Again the middle colic artery was preserved for our distal and.  I next identified our proximal transection point.  This was several centimeters proximal to the ileocecal valve.  A defect was made in the mesentery and the small bowel was taken with a 75 mm blue load GIA stapler.  This left the ileocolic pedicle.  The ilio colic pedicle was ligated close to its origin as to harvest lymph node tissue for oncologic evaluation.  The mesentery was taken with a LigaSure device.  Once the mesentery was taken the colon was freed and passed off the table as specimen.  The small bowel was brought in proximity to the transverse colon.  A side-to-side functional end to end isoperistaltic anastomosis was performed.  The small bowel and and the colon were joined with a 3-0 Vicryl stay stitch.  An enterotomy was made in the small bowel and a colotomy was made in the large bowel.  The common channel was opened with a 75 mm GIA blue load stapler.  Common channel was then closed with a 2 STRATAFIX suture.  I used the same suture to oversew the closure in a Lembert fashion.  The anastomosis appeared intact and there was no evidence of obvious leak from our  anastomosis.  There was minimal stool spillage in the abdomen given that the patient was unable to tolerate any prep.  The omentum was placed over our anastomosis.  The abdomen was then copiously irrigated with warm saline solution.  We then changed our gown and glove and proceeded to a colon closure table and re draped the patient.  The fascia was infiltrated with Marcaine and liposomal bupivacaine solution.  The fascia was then closed with 0 PDS suture running from the  top and the bottom and tied together in the middle.  The skin was then closed with staples.  It was dressed with a sterile dressing.  The patient was then awoken from general endotracheal anesthesia and transferred the PACU in stable condition.  Prior to closure all sponge and instrument counts on both the colon closing table and are operative table were correct x 2.

## 2023-12-31 NOTE — Anesthesia Preprocedure Evaluation (Addendum)
 Anesthesia Evaluation  Patient identified by MRN, date of birth, ID band Patient awake    Reviewed: Allergy & Precautions, NPO status , Patient's Chart, lab work & pertinent test results  History of Anesthesia Complications Negative for: history of anesthetic complications  Airway Mallampati: III  TM Distance: <3 FB Neck ROM: full    Dental  (+) Missing   Pulmonary neg shortness of breath, former smoker   Pulmonary exam normal        Cardiovascular Exercise Tolerance: Good hypertension, (-) angina (-) Past MI Normal cardiovascular exam     Neuro/Psych       Dementia negative neurological ROS     GI/Hepatic Neg liver ROS,GERD  Controlled,,  Endo/Other  diabetes, Type 2    Renal/GU Renal disease     Musculoskeletal   Abdominal   Peds  Hematology  (+) Blood dyscrasia, anemia   Anesthesia Other Findings Past Medical History: No date: Anemia No date: Atherosclerosis of abdominal aorta (HCC) 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 No date: Colon cancer (HCC) No date: Dementia (HCC) No date: 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  Only wears upper  Past Surgical History: 03/01/2020: AMPUTATION TOE; Right     Comment:  Procedure: AMPUTATION TOE;  Surgeon: [name],               DPM;  Location: [facility];  Service: Podiatry;                 Laterality: Right; 01/03/2023: AMPUTATION TOE; Right     Comment:  Procedure: AMPUTATION TOE, 2nd right;  Surgeon: [name], DPM;  Location: [facility];  Service: Podiatry;                Laterality: Right; No date: APPENDECTOMY 12/20/2023: BIOPSY     Comment:  Procedure: BIOPSY;  Surgeon: Norma Fredrickson, Boykin Nearing, MD;                Location: [facility];  Service: Gastroenterology;; 01/03/2023: BONE EXCISION; Right     Comment:  Procedure: Exostectomy interphalangeal joint right great              toe;  Surgeon: [name], DPM;  Location: [facility];               Service: Podiatry;  Laterality: Right; 09/06/2021: CATARACT EXTRACTION W/PHACO; Left     Comment:  Procedure: CATARACT EXTRACTION PHACO AND INTRAOCULAR               LENS PLACEMENT (IOC) LEFT 2.71 00:30.6;  Surgeon: Nevada Crane, MD;  Location: [facility];                Service:  Ophthalmology;  Laterality: Left;  Diabetic 12/20/2023: COLONOSCOPY WITH PROPOFOL; N/A     Comment:  Procedure: COLONOSCOPY WITH PROPOFOL;  Surgeon: Toledo,               Boykin Nearing, MD;  Location: [facility];  Service:               Gastroenterology;  Laterality: N/A; 12/20/2023: ESOPHAGOGASTRODUODENOSCOPY (EGD) WITH PROPOFOL; N/A     Comment:  Procedure: ESOPHAGOGASTRODUODENOSCOPY (EGD) WITH               PROPOFOL;  Surgeon: Toledo, Boykin Nearing, MD;  Location:               [facility];  Service: Gastroenterology;  Laterality:               N/A; No date: EYE SURGERY     Comment:  age 78 No date: HERNIA REPAIR 06/23/2023: INSERTION OF MESH     Comment:  Procedure: INSERTION OF MESH;  Surgeon: Sung Amabile,               DO;  Location: [facility];  Service: General;; No date: KIDNEY STONE SURGERY 04/19/2021: REMOVAL RETAINED LENS; Right     Comment:  Procedure: CATARACT EXTRACTION PHACO,  RIGHT VISION BLUE              0.20 00:01.4 case aborted;  Surgeon: Nevada Crane,               MD;   Location: [facility];  Service:               Ophthalmology;  Laterality: Right;  Diabetic - oral meds 10/06/2016: SHOULDER ARTHROSCOPY WITH BICEPS TENDON REPAIR; Right     Comment:  Procedure: SHOULDER ARTHROSCOPY WITH BICEPS TENDON               REPAIR;  Surgeon: Christena Flake, MD;  Location: [facility];               Service: Orthopedics;  Laterality: Right; 10/06/2016: SHOULDER ARTHROSCOPY WITH OPEN ROTATOR CUFF REPAIR; Right     Comment:  Procedure: SHOULDER ARTHROSCOPY WITH OPEN ROTATOR CUFF               REPAIR;  Surgeon: Christena Flake, MD;  Location: [facility];               Service: Orthopedics;  Laterality: Right; 10/06/2016: SHOULDER ARTHROSCOPY WITH SUBACROMIAL DECOMPRESSION; Right     Comment:  Procedure: SHOULDER ARTHROSCOPY WITH SUBACROMIAL               DECOMPRESSION AND DEBRIDEMENT;  Surgeon: Christena Flake,               MD;  Location: [facility];  Service: Orthopedics;                Laterality: Right; 12/20/2023: SUBMUCOSAL TATTOO INJECTION     Comment:  Procedure: SUBMUCOSAL TATTOO INJECTION;  Surgeon:               Toledo, Boykin Nearing, MD;  Location: [facility];                Service: Gastroenterology;; No date: TONSILLECTOMY  BMI    Body Mass Index: 20.13 kg/m      Reproductive/Obstetrics negative OB ROS  Anesthesia Physical Anesthesia Plan  ASA: 3  Anesthesia Plan: General ETT and Rapid Sequence   Post-op Pain Management:    Induction: Intravenous  PONV Risk Score and Plan: Ondansetron, Dexamethasone, Midazolam and Treatment may vary due to age or medical condition  Airway Management Planned: Oral ETT  Additional Equipment:   Intra-op Plan:   Post-operative Plan: Extubation in OR and Possible Post-op intubation/ventilation  Informed Consent: I have reviewed the patients History and Physical, chart, labs and discussed the procedure including the risks, benefits and alternatives for the  proposed anesthesia with the patient or authorized representative who has indicated his/her understanding and acceptance.     Dental Advisory Given  Plan Discussed with: Anesthesiologist, CRNA and Surgeon  Anesthesia Plan Comments: (Patient and family consented for risks of anesthesia including but not limited to:  - adverse reactions to medications - damage to eyes, teeth, lips or other oral mucosa - nerve damage due to positioning  - sore throat or hoarseness - Damage to heart, brain, nerves, lungs, other parts of body or loss of life  They voiced understanding and assent.)       Anesthesia Quick Evaluation

## 2023-12-31 NOTE — Progress Notes (Signed)
 Arrived to PACU via bed from OR, Arousable to verbal stimuli. Pt diaphoretic, cardiac monitor NSR with PAC's Anesthesia aware and at pt's bedside, 12 lead EKG ordered and done. Dr Randa Ngo compared EKG to last years EKG.  Dr Myriam Forehand notified and agreed with continuous tele monitoring on the floor.

## 2023-12-31 NOTE — Plan of Care (Signed)

## 2023-12-31 NOTE — Progress Notes (Signed)
 Progress Note    Alex ORRICO Sr.  WUJ:[id number] DOB: 12/04/1945  DOA: 12/29/2023 PCP: [name], MD      Brief Narrative:    Medical records reviewed and are as summarized below:  De Burrs. is a 78 y.o. male with medical history significant for DM, HTN, Lewy body dementia, recently diagnosed adenocarcinoma of the colon on colonoscopy 12/20/2023 that was done for iron deficiency anemia and abdominal pain.  She saw her oncologist on 12/27/2023 to discuss therapy for newly diagnosed colon cancer.  He presented to the hospital with nausea, vomiting and worsening right lower quadrant abdominal pain.  He was found to have large bowel obstruction.     Assessment/Plan:   Principal Problem:   Large bowel obstruction (HCC) Active Problems:   HTN (hypertension), benign   Type 2 diabetes mellitus (HCC)   Malignant neoplasm of transverse colon (HCC)   Dementia with behavioral disturbance (HCC)   Electrolyte abnormality   Colon adenocarcinoma (HCC)   Body mass index is 20.13 kg/m.   Large bowel obstruction, recently diagnosed transverse colon adenocarcinoma: S/p ex laparotomy, right hemicolectomy with primary anastomosis on 12/31/2023.  He is still NPO.  Continue IV fluids for hydration.  Analgesics as needed for pain. Follow-up with general surgeon.   Hypokalemia: Improved from 2.9-3.7.  Continue potassium repletion intravenously.  Hyponatremia: Improved Monitor electrolytes and replete as needed   Type II DM: NovoLog correction scale as needed for hyperglycemia   Hypertension: BP is stable.  Use IV hydralazine as needed for severe hypertension   Lewy body dementia: Stable no acute issues.   Diet Order             Diet NPO time specified Except for: Other (See Comments), Sips with Meds  Diet effective now                            Consultants: General surgeon  Procedures: None    Medications:    [START ON 01/01/2024]  enoxaparin (LOVENOX) injection  30 mg Subcutaneous Q24H   insulin aspart  0-15 Units Subcutaneous Q4H   rivastigmine  3 mg Oral BID   Continuous Infusions:  0.9 % NaCl with KCl 40 mEq / L       Anti-infectives (From admission, onward)    Start     Dose/Rate Route Frequency Ordered Stop   12/31/23 2200  metroNIDAZOLE (FLAGYL) tablet 1,000 mg  Status:  Discontinued        1,000 mg Oral  Once 12/30/23 1138 12/30/23 1832   12/31/23 2200  neomycin (MYCIFRADIN) tablet 1,000 mg  Status:  Discontinued        1,000 mg Oral  Once 12/30/23 1138 12/30/23 1832   12/31/23 1500  metroNIDAZOLE (FLAGYL) tablet 1,000 mg  Status:  Discontinued        1,000 mg Oral  Once 12/30/23 1138 12/30/23 1832   12/31/23 1500  neomycin (MYCIFRADIN) tablet 1,000 mg  Status:  Discontinued        1,000 mg Oral  Once 12/30/23 1138 12/30/23 1832   12/31/23 1400  metroNIDAZOLE (FLAGYL) tablet 1,000 mg  Status:  Discontinued        1,000 mg Oral  Once 12/30/23 1138 12/30/23 1832   12/31/23 1400  neomycin (MYCIFRADIN) tablet 1,000 mg  Status:  Discontinued        1,000 mg Oral  Once 12/30/23 1138 12/30/23 1832   12/31/23  0800  ertapenem (INVANZ) 1 g in sodium chloride 0.9 % 100 mL IVPB        1 g 200 mL/hr over 30 Minutes Intravenous  Once 12/30/23 1832 12/31/23 0858   12/30/23 2200  neomycin (MYCIFRADIN) tablet 1,000 mg  Status:  Discontinued        1,000 mg Oral  Once 12/30/23 0915 12/30/23 1138   12/30/23 2200  metroNIDAZOLE (FLAGYL) tablet 1,000 mg  Status:  Discontinued        1,000 mg Oral  Once 12/30/23 0915 12/30/23 1138   12/30/23 1500  neomycin (MYCIFRADIN) tablet 1,000 mg  Status:  Discontinued        1,000 mg Oral  Once 12/30/23 0915 12/30/23 1138   12/30/23 1500  metroNIDAZOLE (FLAGYL) tablet 1,000 mg  Status:  Discontinued        1,000 mg Oral  Once 12/30/23 0915 12/30/23 1138   12/30/23 1400  neomycin (MYCIFRADIN) tablet 1,000 mg  Status:  Discontinued        1,000 mg Oral  Once 12/30/23 0915 12/30/23  1138   12/30/23 1400  metroNIDAZOLE (FLAGYL) tablet 1,000 mg  Status:  Discontinued        1,000 mg Oral  Once 12/30/23 0915 12/30/23 1138              Family Communication/Anticipated D/C date and plan/Code Status   DVT prophylaxis: enoxaparin (LOVENOX) injection 30 mg Start: 01/01/24 0000 SCDs Start: 12/29/23 2147     Code Status: Full Code  Family Communication: Plan discussed with his wife and Ethelene Browns (son) at the bedside Disposition Plan: Plan to discharge home   Status is: Inpatient Remains inpatient appropriate because: Bowel obstruction       Subjective:   Interval events noted.  He complains of abdominal pain.  No shortness of breath.  His wife and Ethelene Browns (son) were at the bedside.  Noelle, RN, was also at the bedside.  Objective:    Vitals:   12/31/23 1248 12/31/23 1300 12/31/23 1335 12/31/23 1400  BP:  (!) 108/53 (!) 106/45 (!) 123/50  Pulse: 86 88 67 62  Resp: 18 15 16    Temp:  97.9 F (36.6 C) 97.6 F (36.4 C)   TempSrc:   Oral   SpO2: 100% 100% 96% 95%  Weight:      Height:       No data found.   Intake/Output Summary (Last 24 hours) at 12/31/2023 1403 Last data filed at 12/31/2023 1141 Gross per 24 hour  Intake 3201.64 ml  Output 1622 ml  Net 1579.64 ml   Filed Weights   12/29/23 1418 12/30/23 0028  Weight: 70.8 kg 69.2 kg    Exam:  GEN: NAD SKIN: Warm and dry EYES: No pallor or icterus ENT: MMM CV: RRR PULM: CTA B ABD: soft, surgical tenderness, dressing on surgical incisional wound is clean, dry and intact CNS: AAO x 2 (person and place), non focal EXT: No edema or tenderness        Data Reviewed:   I have personally reviewed following labs and imaging studies:  Labs: Labs show the following:   Basic Metabolic Panel: Recent Labs  Lab 12/27/23 1515 12/29/23 1420 12/30/23 0607 12/31/23 0732 12/31/23 0738  NA 132* 130* 134* 134* 135  K 3.9 3.2* 2.9* 3.7 3.7  CL 94* 91* 100 101 98  CO2 26 26 26 25   --    GLUCOSE 190* 194* 114* 103* 97  BUN 21 29* 25* 16 13  CREATININE 0.79 0.78 0.75 0.72 0.80  CALCIUM 9.2 8.4* 7.9* 7.9*  --   MG  --   --  1.8 1.9  --   PHOS  --   --  2.5 2.4*  --    GFR Estimated Creatinine Clearance: 75.7 mL/min (by C-G formula based on SCr of 0.8 mg/dL). Liver Function Tests: Recent Labs  Lab 12/27/23 1515 12/29/23 1420  AST 19 17  ALT 17 15  ALKPHOS 93 91  BILITOT 1.0 1.1  PROT 7.3 6.7  ALBUMIN 4.0 3.5   Recent Labs  Lab 12/29/23 1420  LIPASE 20   No results for input(s): "AMMONIA" in the last 168 hours. Coagulation profile No results for input(s): "INR", "PROTIME" in the last 168 hours.  CBC: Recent Labs  Lab 12/27/23 1515 12/29/23 1420 12/30/23 0607 12/31/23 0732 12/31/23 0738  WBC 5.1 3.8* 2.9* 3.2*  --   NEUTROABS 4.0  --   --  1.8  --   HGB 10.8* 10.7* 9.0* 9.3* 9.2*  HCT 33.6* 32.2* 27.7* 28.5* 27.0*  MCV 85.7 85.0 85.8 86.6  --   PLT 271 299 244 240  --    Cardiac Enzymes: No results for input(s): "CKTOTAL", "CKMB", "CKMBINDEX", "TROPONINI" in the last 168 hours. BNP (last 3 results) No results for input(s): "PROBNP" in the last 8760 hours. CBG: Recent Labs  Lab 12/30/23 1801 12/30/23 2006 12/30/23 2335 12/31/23 0426 12/31/23 1122  GLUCAP 118* 104* 93 84 122*   D-Dimer: No results for input(s): "DDIMER" in the last 72 hours. Hgb A1c: Recent Labs    12/30/23 0607  HGBA1C 6.9*   Lipid Profile: No results for input(s): "CHOL", "HDL", "LDLCALC", "TRIG", "CHOLHDL", "LDLDIRECT" in the last 72 hours. Thyroid function studies: No results for input(s): "TSH", "T4TOTAL", "T3FREE", "THYROIDAB" in the last 72 hours.  Invalid input(s): "FREET3" Anemia work up: No results for input(s): "VITAMINB12", "FOLATE", "FERRITIN", "TIBC", "IRON", "RETICCTPCT" in the last 72 hours.  Sepsis Labs: Recent Labs  Lab 12/27/23 1515 12/29/23 1420 12/30/23 0607 12/31/23 0732  WBC 5.1 3.8* 2.9* 3.2*    Microbiology No results found for  this or any previous visit (from the past 240 hours).  Procedures and diagnostic studies:  DG Abd 1 View Result Date: 12/30/2023 CLINICAL DATA:  Nasogastric tube placement. EXAM: ABDOMEN - 1 VIEW COMPARISON:  Earlier today FINDINGS: Tip and side port of the enteric tube below the diaphragm in the stomach. Gaseous small bowel distension centrally. IMPRESSION: Tip and side port of the enteric tube below the diaphragm in the stomach. Electronically Signed   By: Narda Rutherford M.D.   On: 12/30/2023 18:02   DG Abd 1 View Result Date: 12/30/2023 CLINICAL DATA:  Abdominal distention, watery stools. EXAM: ABDOMEN - 1 VIEW COMPARISON:  CT abdomen pelvis dated 12/29/2023 FINDINGS: Multiple dilated loops of small bowel are seen in the midabdomen, similar to prior exam accounting for differences in technique. Gas overlies the rectum. Degenerative changes are seen in the spine. IMPRESSION: Multiple dilated loops of small bowel in the midabdomen, similar to prior exam accounting for differences in technique. These findings likely reflect bowel obstruction. Electronically Signed   By: Romona Curls M.D.   On: 12/30/2023 16:59   CT ABDOMEN PELVIS W CONTRAST Result Date: 12/29/2023 CLINICAL DATA:  Nausea, vomiting, generalized abdominal pain for 1-2 days. Recent diagnosis of colon cancer. Rule out obstruction. EXAM: CT ABDOMEN AND PELVIS WITH CONTRAST TECHNIQUE: Multidetector CT imaging of the abdomen and pelvis was performed using the standard  protocol following bolus administration of intravenous contrast. RADIATION DOSE REDUCTION: This exam was performed according to the departmental dose-optimization program which includes automated exposure control, adjustment of the mA and/or kV according to patient size and/or use of iterative reconstruction technique. CONTRAST:  OMNIPAQUE IOHEXOL 300 MG/ML  SOLN COMPARISON:  CT abdomen and pelvis 10/14/2023 FINDINGS: Lower chest: No acute abnormality. Hepatobiliary: No focal  hepatic lesion. Unremarkable gallbladder and biliary tree. Pancreas: Unremarkable. Spleen: Unremarkable. Adrenals/Urinary Tract: Stable adrenal glands. No urinary calculi or hydronephrosis. Unremarkable bladder. Stomach/Bowel: Stomach is within normal limits. Fluid-filled mid and distal small bowel at the upper limits of normal in caliber and increased compared to 10/14/2023. The cecum and ascending colon are fluid-filled with abrupt transition point in the proximal transverse colon at the site of the mass seen on colonoscopy 12/20/2023. There is increased wall thickening of the cecum and ascending colon compared to 10/14/2023. There are few additional segments of wall thickening in the mid and distal small bowel. Diverticulosis in the descending colon. Trace adjacent stranding and fluid. Vascular/Lymphatic: Aortic atherosclerosis. No enlarged abdominal or pelvic lymph nodes. Reproductive: No acute abnormality. Other: No free intraperitoneal air. Musculoskeletal: No acute fracture.  No destructive osseous lesion. IMPRESSION: 1. Worsening obstruction in the proximal transverse colon secondary to the known adenocarcinoma compared with CT 10/14/2023. 2. Increased wall thickening of the cecum and ascending colon compared to 10/14/2023. There are few additional segments of wall thickening in the mid and distal small bowel. Findings are favored to represent nonspecific enterocolitis. 3. Question mild diverticulitis in the descending colon. 4. Aortic Atherosclerosis (ICD10-I70.0). Electronically Signed   By: Minerva Fester M.D.   On: 12/29/2023 19:18               LOS: 2 days   Jerah Esty  [facility]   Pager on [email or web address]. If 7PM-7AM, please contact night-coverage at [email or web address]     12/31/2023, 2:03 PM

## 2023-12-31 NOTE — Plan of Care (Signed)
  Problem: Coping: Goal: Ability to adjust to condition or change in health will improve Outcome: Progressing   Problem: Fluid Volume: Goal: Ability to maintain a balanced intake and output will improve Outcome: Progressing   Problem: Metabolic: Goal: Ability to maintain appropriate glucose levels will improve Outcome: Progressing   Problem: Metabolic: Goal: Ability to maintain appropriate glucose levels will improve Outcome: Progressing

## 2023-12-31 NOTE — Anesthesia Postprocedure Evaluation (Signed)
 Anesthesia Post Note  Patient: Alex KAIGLER Sr.  Procedure(s) Performed: EXPLORATORY LAPAROTOMY WITH RIGHT COLECTOMY (Right)  Patient location during evaluation: PACU Anesthesia Type: General Level of consciousness: awake and alert Pain management: pain level controlled Vital Signs Assessment: post-procedure vital signs reviewed and stable Respiratory status: spontaneous breathing, nonlabored ventilation, respiratory function stable and patient connected to nasal cannula oxygen Cardiovascular status: blood pressure returned to baseline and stable Postop Assessment: no apparent nausea or vomiting Anesthetic complications: no Comments: Patient initially had many PACs that resolved while he was in PACU. EKG in PACU was very similar to his EKG from last year. Patient asymptomatic from a cardiac standpoint. Plan to send the patient back to the floor on tele monitoring. Patient was discussed with his internist Dr. Myriam Forehand who agreed with this plan of care.   No notable events documented.   Last Vitals:  Vitals:   12/31/23 1230 12/31/23 1248  BP: (!) 117/53   Pulse: 92 86  Resp: 17 18  Temp:    SpO2: 100% 100%    Last Pain:  Vitals:   12/31/23 1248  TempSrc:   PainSc: 7                  Cleda Mccreedy Zamir Staples

## 2023-12-31 NOTE — Anesthesia Procedure Notes (Signed)
 Procedure Name: Intubation Date/Time: 12/31/2023 8:23 AM  Performed by: Elmarie Mainland, CRNAPre-anesthesia Checklist: Patient identified, Emergency Drugs available, Suction available and Patient being monitored Patient Re-evaluated:Patient Re-evaluated prior to induction Oxygen Delivery Method: Circle system utilized Preoxygenation: Pre-oxygenation with 100% oxygen Induction Type: IV induction, Rapid sequence and Cricoid Pressure applied Laryngoscope Size: McGrath and 4 Grade View: Grade I Tube type: Oral Tube size: 7.0 mm Number of attempts: 1 Airway Equipment and Method: Video-laryngoscopy and Stylet Placement Confirmation: ETT inserted through vocal cords under direct vision, positive ETCO2 and breath sounds checked- equal and bilateral Secured at: 22 cm Tube secured with: Tape Dental Injury: Teeth and Oropharynx as per pre-operative assessment

## 2024-01-01 ENCOUNTER — Inpatient Hospital Stay

## 2024-01-01 ENCOUNTER — Encounter: Payer: Self-pay | Admitting: General Surgery

## 2024-01-01 DIAGNOSIS — K56609 Unspecified intestinal obstruction, unspecified as to partial versus complete obstruction: Secondary | ICD-10-CM | POA: Diagnosis not present

## 2024-01-01 LAB — CBC
HCT: 27.9 % — ABNORMAL LOW (ref 39.0–52.0)
Hemoglobin: 9.4 g/dL — ABNORMAL LOW (ref 13.0–17.0)
MCH: 28.5 pg (ref 26.0–34.0)
MCHC: 33.7 g/dL (ref 30.0–36.0)
MCV: 84.5 fL (ref 80.0–100.0)
Platelets: 295 10*3/uL (ref 150–400)
RBC: 3.3 MIL/uL — ABNORMAL LOW (ref 4.22–5.81)
RDW: 15.1 % (ref 11.5–15.5)
WBC: 12 10*3/uL — ABNORMAL HIGH (ref 4.0–10.5)
nRBC: 0 % (ref 0.0–0.2)

## 2024-01-01 LAB — GLUCOSE, CAPILLARY
Glucose-Capillary: 102 mg/dL — ABNORMAL HIGH (ref 70–99)
Glucose-Capillary: 127 mg/dL — ABNORMAL HIGH (ref 70–99)
Glucose-Capillary: 135 mg/dL — ABNORMAL HIGH (ref 70–99)
Glucose-Capillary: 140 mg/dL — ABNORMAL HIGH (ref 70–99)
Glucose-Capillary: 87 mg/dL (ref 70–99)
Glucose-Capillary: 97 mg/dL (ref 70–99)

## 2024-01-01 LAB — RENAL FUNCTION PANEL
Albumin: 2.3 g/dL — ABNORMAL LOW (ref 3.5–5.0)
Anion gap: 8 (ref 5–15)
BUN: 24 mg/dL — ABNORMAL HIGH (ref 8–23)
CO2: 23 mmol/L (ref 22–32)
Calcium: 7.6 mg/dL — ABNORMAL LOW (ref 8.9–10.3)
Chloride: 106 mmol/L (ref 98–111)
Creatinine, Ser: 0.87 mg/dL (ref 0.61–1.24)
GFR, Estimated: >60 mL/min (ref >60)
Glucose, Bld: 136 mg/dL — ABNORMAL HIGH (ref 70–99)
Phosphorus: 2.6 mg/dL (ref 2.5–4.6)
Potassium: 4.2 mmol/L (ref 3.5–5.1)
Sodium: 137 mmol/L (ref 135–145)

## 2024-01-01 LAB — MAGNESIUM: Magnesium: 2 mg/dL (ref 1.7–2.4)

## 2024-01-01 MED ORDER — ACETAMINOPHEN 10 MG/ML IV SOLN
1000.0000 mg | Freq: Four times a day (QID) | INTRAVENOUS | Status: AC
  Administered 2024-01-01 – 2024-01-02 (×4): 1000 mg via INTRAVENOUS
  Filled 2024-01-01 (×4): qty 100

## 2024-01-01 MED ORDER — POTASSIUM CHLORIDE IN NACL 20-0.9 MEQ/L-% IV SOLN
INTRAVENOUS | Status: DC
Start: 2024-01-01 — End: 2024-01-02
  Filled 2024-01-01 (×3): qty 1000

## 2024-01-01 MED ORDER — PANTOPRAZOLE SODIUM 40 MG IV SOLR
40.0000 mg | Freq: Two times a day (BID) | INTRAVENOUS | Status: DC
  Administered 2024-01-01 – 2024-01-03 (×4): 40 mg via INTRAVENOUS
  Filled 2024-01-01 (×4): qty 10

## 2024-01-01 NOTE — Progress Notes (Signed)
 Pt ng tube dislodged from 70cm to 63 clamped and notified providers Manus Rudd notified. KUB ordered.

## 2024-01-01 NOTE — Progress Notes (Signed)
**Note Alex-Identified via Obfuscation**  Progress Note    Alex SILVERA Sr.  WUJ:[id number] DOB: 06-Jan-1946  DOA: 12/29/2023 PCP: [name], MD      Brief Narrative:    Medical records reviewed and are as summarized below:  Alex Burrs. is a 78 y.o. male with medical history significant for DM, HTN, Lewy body dementia, recently diagnosed adenocarcinoma of the colon on colonoscopy 12/20/2023 that was done for iron deficiency anemia and abdominal pain.  She saw her oncologist on 12/27/2023 to discuss therapy for newly diagnosed colon cancer.  He presented to the hospital with nausea, vomiting and worsening right lower quadrant abdominal pain.  He was found to have large bowel obstruction.     Assessment/Plan:   Principal Problem:   Large bowel obstruction (HCC) Active Problems:   HTN (hypertension), benign   Type 2 diabetes mellitus (HCC)   Malignant neoplasm of transverse colon (HCC)   Dementia with behavioral disturbance (HCC)   Electrolyte abnormality   Colon adenocarcinoma (HCC)   Body mass index is 20.13 kg/m.   Large bowel obstruction, recently diagnosed transverse colon adenocarcinoma: S/p ex laparotomy, right hemicolectomy with primary anastomosis on 12/31/2023.   He remains NPO.  Continue IV fluids for hydration.  IV morphine as needed for pain.   Hypokalemia: Improved.  Continue potassium repletion because of gastric decompression with NG tube.  Hyponatremia: Improved Monitor electrolytes and replete as needed   Type II DM: NovoLog correction scale as needed for hyperglycemia   Hypertension: BP is stable.  Use IV hydralazine as needed for severe hypertension   Lewy body dementia: No acute issues.   Diet Order             Diet NPO time specified Except for: Other (See Comments), Sips with Meds  Diet effective now                            Consultants: General surgeon  Procedures: None    Medications:    enoxaparin (LOVENOX) injection  30 mg  Subcutaneous Q24H   insulin aspart  0-15 Units Subcutaneous Q4H   rivastigmine  3 mg Oral BID   Continuous Infusions:  0.9 % NaCl with KCl 20 mEq / L     acetaminophen 1,000 mg (01/01/24 0908)   piperacillin-tazobactam (ZOSYN)  IV 3.375 g (01/01/24 1424)     Anti-infectives (From admission, onward)    Start     Dose/Rate Route Frequency Ordered Stop   01/01/24 0600  piperacillin-tazobactam (ZOSYN) IVPB 3.375 g        3.375 g 12.5 mL/hr over 240 Minutes Intravenous Every 8 hours 12/31/23 1427 01/02/24 0559   12/31/23 2200  metroNIDAZOLE (FLAGYL) tablet 1,000 mg  Status:  Discontinued        1,000 mg Oral  Once 12/30/23 1138 12/30/23 1832   12/31/23 2200  neomycin (MYCIFRADIN) tablet 1,000 mg  Status:  Discontinued        1,000 mg Oral  Once 12/30/23 1138 12/30/23 1832   12/31/23 1500  metroNIDAZOLE (FLAGYL) tablet 1,000 mg  Status:  Discontinued        1,000 mg Oral  Once 12/30/23 1138 12/30/23 1832   12/31/23 1500  neomycin (MYCIFRADIN) tablet 1,000 mg  Status:  Discontinued        1,000 mg Oral  Once 12/30/23 1138 12/30/23 1832   12/31/23 1400  metroNIDAZOLE (FLAGYL) tablet 1,000 mg  Status:  Discontinued  1,000 mg Oral  Once 12/30/23 1138 12/30/23 1832   12/31/23 1400  neomycin (MYCIFRADIN) tablet 1,000 mg  Status:  Discontinued        1,000 mg Oral  Once 12/30/23 1138 12/30/23 1832   12/31/23 0800  ertapenem (INVANZ) 1 g in sodium chloride 0.9 % 100 mL IVPB        1 g 200 mL/hr over 30 Minutes Intravenous  Once 12/30/23 1832 12/31/23 0858   12/30/23 2200  neomycin (MYCIFRADIN) tablet 1,000 mg  Status:  Discontinued        1,000 mg Oral  Once 12/30/23 0915 12/30/23 1138   12/30/23 2200  metroNIDAZOLE (FLAGYL) tablet 1,000 mg  Status:  Discontinued        1,000 mg Oral  Once 12/30/23 0915 12/30/23 1138   12/30/23 1500  neomycin (MYCIFRADIN) tablet 1,000 mg  Status:  Discontinued        1,000 mg Oral  Once 12/30/23 0915 12/30/23 1138   12/30/23 1500  metroNIDAZOLE (FLAGYL)  tablet 1,000 mg  Status:  Discontinued        1,000 mg Oral  Once 12/30/23 0915 12/30/23 1138   12/30/23 1400  neomycin (MYCIFRADIN) tablet 1,000 mg  Status:  Discontinued        1,000 mg Oral  Once 12/30/23 0915 12/30/23 1138   12/30/23 1400  metroNIDAZOLE (FLAGYL) tablet 1,000 mg  Status:  Discontinued        1,000 mg Oral  Once 12/30/23 0915 12/30/23 1138              Family Communication/Anticipated D/C date and plan/Code Status   DVT prophylaxis: enoxaparin (LOVENOX) injection 30 mg Start: 01/01/24 0000 SCDs Start: 12/29/23 2147     Code Status: Full Code  Family Communication: Plan discussed with Timmy, son, at the bedside Disposition Plan: Plan to discharge home   Status is: Inpatient Remains inpatient appropriate because: Bowel obstruction       Subjective:   Interval events noted.  He complains of abdominal pain.  Timmy, son, was at the bedside  Objective:    Vitals:   12/31/23 2027 01/01/24 0324 01/01/24 0826 01/01/24 1433  BP: 127/65 (!) 147/56 (!) 147/69 (!) 142/48  Pulse: 90 97 80 80  Resp: 20 20 16 20   Temp: 98.3 F (36.8 C) 98.1 F (36.7 C) 99.1 F (37.3 C) 98.4 F (36.9 C)  TempSrc: Oral Oral Oral   SpO2: 97% 99% 97% 97%  Weight:      Height:       No data found.   Intake/Output Summary (Last 24 hours) at 01/01/2024 1509 Last data filed at 01/01/2024 1053 Gross per 24 hour  Intake 1153.28 ml  Output 1250 ml  Net -96.72 ml   Filed Weights   12/29/23 1418 12/30/23 0028  Weight: 70.8 kg 69.2 kg    Exam:  GEN: NAD SKIN: Warm and dry EYES: No pallor or icterus ENT: MMM, NG tube draining greenish fluid into the canister CV: RRR PULM: CTA B ABD: soft, ND, NT, +BS CNS: AAO x 2 (person and place), non focal EXT: No edema or tenderness       Data Reviewed:   I have personally reviewed following labs and imaging studies:  Labs: Labs show the following:   Basic Metabolic Panel: Recent Labs  Lab 12/27/23 1515  12/29/23 1420 12/30/23 0607 12/31/23 0732 12/31/23 0738 01/01/24 0440  NA 132* 130* 134* 134* 135 137  K 3.9 3.2* 2.9* 3.7 3.7 4.2  CL 94* 91* 100 101 98 106  CO2 26 26 26 25   --  23  GLUCOSE 190* 194* 114* 103* 97 136*  BUN 21 29* 25* 16 13 24*  CREATININE 0.79 0.78 0.75 0.72 0.80 0.87  CALCIUM 9.2 8.4* 7.9* 7.9*  --  7.6*  MG  --   --  1.8 1.9  --  2.0  PHOS  --   --  2.5 2.4*  --  2.6   GFR Estimated Creatinine Clearance: 69.6 mL/min (by C-G formula based on SCr of 0.87 mg/dL). Liver Function Tests: Recent Labs  Lab 12/27/23 1515 12/29/23 1420 01/01/24 0440  AST 19 17  --   ALT 17 15  --   ALKPHOS 93 91  --   BILITOT 1.0 1.1  --   PROT 7.3 6.7  --   ALBUMIN 4.0 3.5 2.3*   Recent Labs  Lab 12/29/23 1420  LIPASE 20   No results for input(s): "AMMONIA" in the last 168 hours. Coagulation profile No results for input(s): "INR", "PROTIME" in the last 168 hours.  CBC: Recent Labs  Lab 12/27/23 1515 12/29/23 1420 12/30/23 0607 12/31/23 0732 12/31/23 0738 01/01/24 0440  WBC 5.1 3.8* 2.9* 3.2*  --  12.0*  NEUTROABS 4.0  --   --  1.8  --   --   HGB 10.8* 10.7* 9.0* 9.3* 9.2* 9.4*  HCT 33.6* 32.2* 27.7* 28.5* 27.0* 27.9*  MCV 85.7 85.0 85.8 86.6  --  84.5  PLT 271 299 244 240  --  295   Cardiac Enzymes: No results for input(s): "CKTOTAL", "CKMB", "CKMBINDEX", "TROPONINI" in the last 168 hours. BNP (last 3 results) No results for input(s): "PROBNP" in the last 8760 hours. CBG: Recent Labs  Lab 12/31/23 2024 12/31/23 2353 01/01/24 0318 01/01/24 0827 01/01/24 1206  GLUCAP 136* 134* 102* 135* 127*   D-Dimer: No results for input(s): "DDIMER" in the last 72 hours. Hgb A1c: Recent Labs    12/30/23 0607  HGBA1C 6.9*   Lipid Profile: No results for input(s): "CHOL", "HDL", "LDLCALC", "TRIG", "CHOLHDL", "LDLDIRECT" in the last 72 hours. Thyroid function studies: No results for input(s): "TSH", "T4TOTAL", "T3FREE", "THYROIDAB" in the last 72  hours.  Invalid input(s): "FREET3" Anemia work up: No results for input(s): "VITAMINB12", "FOLATE", "FERRITIN", "TIBC", "IRON", "RETICCTPCT" in the last 72 hours.  Sepsis Labs: Recent Labs  Lab 12/29/23 1420 12/30/23 0607 12/31/23 0732 01/01/24 0440  WBC 3.8* 2.9* 3.2* 12.0*    Microbiology No results found for this or any previous visit (from the past 240 hours).  Procedures and diagnostic studies:  DG Abd 1 View Result Date: 12/30/2023 CLINICAL DATA:  Nasogastric tube placement. EXAM: ABDOMEN - 1 VIEW COMPARISON:  Earlier today FINDINGS: Tip and side port of the enteric tube below the diaphragm in the stomach. Gaseous small bowel distension centrally. IMPRESSION: Tip and side port of the enteric tube below the diaphragm in the stomach. Electronically Signed   By: Narda Rutherford M.D.   On: 12/30/2023 18:02   DG Abd 1 View Result Date: 12/30/2023 CLINICAL DATA:  Abdominal distention, watery stools. EXAM: ABDOMEN - 1 VIEW COMPARISON:  CT abdomen pelvis dated 12/29/2023 FINDINGS: Multiple dilated loops of small bowel are seen in the midabdomen, similar to prior exam accounting for differences in technique. Gas overlies the rectum. Degenerative changes are seen in the spine. IMPRESSION: Multiple dilated loops of small bowel in the midabdomen, similar to prior exam accounting for differences in technique. These findings likely reflect  bowel obstruction. Electronically Signed   By: Romona Curls M.D.   On: 12/30/2023 16:59               LOS: 3 days   Davier Tramell  [facility]   Pager on [email or web address]. If 7PM-7AM, please contact night-coverage at [email or web address]     01/01/2024, 3:09 PM

## 2024-01-01 NOTE — Progress Notes (Signed)
 Mobility Specialist - Progress Note   01/01/24 1600  Mobility  Activity Ambulated with assistance in room;Ambulated with assistance to bathroom;Transferred from chair to bed  Level of Assistance Minimal assist, patient does 75% or more  Assistive Device Front wheel walker  Mobility visit 1 Mobility     Pt sitting in recliner upon arrival, utilizing RA. Pt requesting assistance to bathroom. STS from recliner and ambulation with minA +2. Post lean in standing. Constant multimodal cues to shift weight to midline. Pt favors L side with L lateral lean during ambulation. Short, shuffled steps. Cues for staying close/inside RW. No LOB. Pt follows commands fairly. VC for hand placement. Pt returned to bed with alarm set, needs in reach. Family at bedside.    Alex Soto Mobility Specialist 01/01/24, 4:19 PM

## 2024-01-01 NOTE — Progress Notes (Addendum)
 [facility] SURGICAL PROGRESS NOTE  Hospital Day(s): 3.   Post op day(s): 1 Day Post-Op.   Interval History:  Patient seen and examined No acute events or new complaints overnight.  Patient reports abdominal soreness; thirsty No fever, chills, emesis Leukocytosis to 12.0 - reactive Hgb 9.4 - stable  Renal function normal; sCr - 0.87; UO - 795 ccs No electrolyte derangements  NGT in place; 450 ccs Still without flatus   Vital signs in last 24 hours: [min-max] current  Temp:  [97.4 F (36.3 C)-98.3 F (36.8 C)] 98.1 F (36.7 C) (03/03 0324) Pulse Rate:  [62-100] 97 (03/03 0324) Resp:  [15-20] 20 (03/03 0324) BP: (106-147)/(45-67) 147/56 (03/03 0324) SpO2:  [95 %-100 %] 99 % (03/03 0324)     Height: 6\' 1"  (185.4 cm) Weight: 69.2 kg BMI (Calculated): 20.13   Intake/Output last 2 shifts:  03/02 0701 - 03/03 0700 In: 2853.3 [I.V.:2153.3; IV Piggyback:700] Out: 1570 [Urine:795; Emesis/NG output:450; Blood:200]   Physical Exam:  Constitutional: alert, cooperative and no distress  HEENT: NGT in place Respiratory: breathing non-labored at rest  Cardiovascular: regular rate and sinus rhythm  Gastrointestinal: soft, incisional tenderness, and non-distended, no rebound/guarding Genitourinary: Foley in place Integumentary: Laparotomy is CDI with staples and honeycomb; no erythema  Labs:     Latest Ref Rng & Units 01/01/2024    4:40 AM 12/31/2023    7:38 AM 12/31/2023    7:32 AM  CBC  WBC 4.0 - 10.5 K/uL 12.0   3.2   Hemoglobin 13.0 - 17.0 g/dL 9.4  9.2  9.3   Hematocrit 39.0 - 52.0 % 27.9  27.0  28.5   Platelets 150 - 400 K/uL 295   240       Latest Ref Rng & Units 01/01/2024    4:40 AM 12/31/2023    7:38 AM 12/31/2023    7:32 AM  CMP  Glucose 70 - 99 mg/dL 161  97  096   BUN 8 - 23 mg/dL 24  13  16    Creatinine 0.61 - 1.24 mg/dL 0.45  4.09  8.11   Sodium 135 - 145 mmol/L 137  135  134   Potassium 3.5 - 5.1 mmol/L 4.2  3.7  3.7   Chloride 98 - 111 mmol/L 106   98  101   CO2 22 - 32 mmol/L 23   25   Calcium 8.9 - 10.3 mg/dL 7.6   7.9      Imaging studies: No new pertinent imaging studies   Assessment/Plan:  78 y.o. male 1 Day Post-Op s/p right hemicolectomy for colon CA   - Will keep NGT; LIS; monitor and record output   - NPO today; would not be surprised if develops ileus. If no bowel function in next 24 hours, will do PICC/TPN tomorrow (03/04).   - Complete IV Abx (Zosyn)  - Will DC foley catheter today  - Monitor abdominal examination; on-going bowel function   - Pain control prn; Added IV tylenol  - Antiemetics prn  - Monitor leukocytosis; likely reactive  - Mobilize; PT/OT order placed  - Further management per primary service; we will follow   All of the above findings and recommendations were discussed with the patient, patient's family (wife at bedside), and the medical team, and all of patient's questions were answered to their expressed satisfaction.  -- Lynden Oxford, PA-C [facility] 01/01/2024, 7:13 AM M-F: 7am - 4pm

## 2024-01-01 NOTE — Evaluation (Signed)
 Occupational Therapy Evaluation Patient Details Name: Alex Soto Sr. MRN: [id number] DOB: Jan 12, 1946 Today's Date: 01/01/2024   History of Present Illness   Pt is a 78 yo s/p exploratory laparatomy and colectomy. 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     Clinical Impressions Pt was seen for PT/OT co-evaluation this date. PTA, pt lives at home with his wife and sons who are available to assist. He was MOD I/IND with ADLs and mobility.  Pt presents to acute OT demonstrating impaired ADL performance and functional mobility 2/2 weakness, pain, low activity tolerance and balance deficits. Pt is seated EOB with PT present. He currently requires Min/CGA x2 for STS from elevated EOB to RW. Increased time and multi-modal cueing provided for step pivot from bed to recliner using RW with Min A x2 for RW management during turn and constant cues for small, shuffling steps. Cueing for slow descent to chair. Pt in 10/10 abdominal pain. BUE strength 3+/5. Pt very limited by pain and weakness at this time and is a high fall risk. Anticipate Max A for LB ADLs and Min A for UB ADLs. Pt would benefit from skilled OT services to address noted impairments and functional limitations (see below for any additional details) in order to maximize safety and independence while minimizing falls risk and caregiver burden. Do anticipate the need for follow up OT services upon acute hospital DC.      If plan is discharge home, recommend the following:   A lot of help with bathing/dressing/bathroom;A lot of help with walking and/or transfers;Assistance with cooking/housework;Assist for transportation;Help with stairs or ramp for entrance     Functional Status Assessment   Patient has had a recent decline in their functional status and demonstrates the ability to make significant improvements in function in a reasonable and predictable amount of time.     Equipment  Recommendations   BSC/3in1;Other (comment) (defer)     Recommendations for Other Services         Precautions/Restrictions   Precautions Precautions: Fall Restrictions Weight Bearing Restrictions Per Provider Order: No     Mobility Bed Mobility               General bed mobility comments: NT-seated EOB with PT on entry    Transfers Overall transfer level: Needs assistance Equipment used: Rolling walker (2 wheels) Transfers: Sit to/from Stand, Bed to chair/wheelchair/BSC Sit to Stand: From elevated surface, Min assist, Contact guard assist, +2 physical assistance, +2 safety/equipment     Step pivot transfers: Min assist, +2 safety/equipment, +2 physical assistance, From elevated surface     General transfer comment: Min A x1 and CGA x1 for STS from EOB to RW and Min A x2 for SPT to recliner with hands on assist to manuever walker and multi modal cueing with small shuffle steps      Balance Overall balance assessment: Needs assistance Sitting-balance support: Feet supported Sitting balance-Leahy Scale: Fair Sitting balance - Comments: no LOB while seated EOB   Standing balance support: Reliant on assistive device for balance, Bilateral upper extremity supported Standing balance-Leahy Scale: Poor Standing balance comment: Min A x2 for SPT using RW                           ADL either performed or assessed with clinical judgement   ADL Overall ADL's : Needs assistance/impaired  Toilet Transfer: Rolling walker (2 wheels);Minimal assistance;+2 for safety/equipment;+2 for physical assistance Toilet Transfer Details (indicate cue type and reason): simulated to recliner           General ADL Comments: anticipate Max A for LB ADLs, Min/MOD A for UB ADLs     Vision         Perception         Praxis         Pertinent Vitals/Pain Pain Assessment Pain Assessment: 0-10 Pain Score: 10-Worst pain  ever Pain Location: incision site/abdomen Pain Descriptors / Indicators: Sore Pain Intervention(s): Monitored during session, Repositioned     Extremity/Trunk Assessment Upper Extremity Assessment Upper Extremity Assessment: Generalized weakness;Left hand dominant (3+/5)   Lower Extremity Assessment Lower Extremity Assessment: Generalized weakness       Communication Communication Communication: No apparent difficulties   Cognition Arousal: Alert                                   Following commands: Impaired Following commands impaired: Follows one step commands with increased time     Cueing  General Comments   Cueing Techniques: Verbal cues;Tactile cues;Gestural cues      Exercises Other Exercises Other Exercises: Edu on role of OT in acute setting   Shoulder Instructions      Home Living Family/patient expects to be discharged to:: Skilled nursing facility Living Arrangements: Spouse/significant other;Children (sons) Available Help at Discharge: Family;Available 24 hours/day Type of Home: House             Bathroom Shower/Tub: Chief Strategy Officer: Standard     Home Equipment: Agricultural consultant (2 wheels);Hand held shower head          Prior Functioning/Environment Prior Level of Function : Independent/Modified Independent             Mobility Comments: MOD I/IND ADLs Comments: IND with ADLs, family assisted with all IADLs and drove him to appts, etc.    OT Problem List: Decreased strength;Impaired balance (sitting and/or standing);Decreased activity tolerance;Pain   OT Treatment/Interventions: Self-care/ADL training;Therapeutic exercise;Balance training;Therapeutic activities;DME and/or AE instruction;Patient/family education      OT Goals(Current goals can be found in the care plan section)   Acute Rehab OT Goals Patient Stated Goal: improve strength OT Goal Formulation: With patient/family Time For Goal  Achievement: 01/15/24 Potential to Achieve Goals: Fair ADL Goals Pt Will Perform Upper Body Bathing: sitting;with supervision Pt Will Perform Lower Body Bathing: with min assist;sit to/from stand;sitting/lateral leans Pt Will Perform Lower Body Dressing: with min assist;sitting/lateral leans;sit to/from stand Pt Will Transfer to Toilet: with contact guard assist;regular height toilet;ambulating Pt Will Perform Toileting - Clothing Manipulation and hygiene: with min assist;sitting/lateral leans;sit to/from stand   OT Frequency:  Min 1X/week    Co-evaluation PT/OT/SLP Co-Evaluation/Treatment: Yes Reason for Co-Treatment: For patient/therapist safety PT goals addressed during session: Mobility/safety with mobility OT goals addressed during session: ADL's and self-care      AM-PAC OT "6 Clicks" Daily Activity     Outcome Measure Help from another person eating meals?: None   Help from another person toileting, which includes using toliet, bedpan, or urinal?: A Lot Help from another person bathing (including washing, rinsing, drying)?: A Lot Help from another person to put on and taking off regular upper body clothing?: A Lot Help from another person to put on and taking off regular lower body clothing?: A Lot  6 Click Score: 12   End of Session Equipment Utilized During Treatment: Rolling walker (2 wheels) (NG tube) Nurse Communication: Mobility status  Activity Tolerance: Patient tolerated treatment well;Patient limited by pain Patient left: in chair;with call bell/phone within reach;with chair alarm set;with family/visitor present  OT Visit Diagnosis: Other abnormalities of gait and mobility (R26.89);Muscle weakness (generalized) (M62.81);Pain                Time: 1610-9604 OT Time Calculation (min): 14 min Charges:  OT General Charges $OT Visit: 1 Visit OT Evaluation $OT Eval Moderate Complexity: 1 Mod  Jarome Trull, OTR/L 01/01/24, 10:14 AM   Octavion Mollenkopf E Aislinn Feliz 01/01/2024,  10:11 AM

## 2024-01-01 NOTE — Care Management Important Message (Signed)
 Important Message  Patient Details  Name: Alex TENCH Sr. MRN: [id number] Date of Birth: 1946/07/08   Important Message Given:  Yes - Medicare IM     Cristela Blue, CMA 01/01/2024, 10:57 AM

## 2024-01-01 NOTE — Evaluation (Signed)
 Physical Therapy Evaluation Patient Details Name: EDNA GROVER Sr. MRN: [id number] DOB: 02-25-1946 Today's Date: 01/01/2024  History of Present Illness  Pt is a 78 yo s/p exploratory laparatomy and colectomy. 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  Clinical Impression  Patient alert, agreeable to PT with some encouragement, premedicated with morphine and RN in room to hang IV tylenol as well. Pt endorsed prior to onset of abdominal pain/concerns, he was independent at home, lives with several family members.   Rolling and sidelying to sit with modA (log roll to minimize abdominal strain). Able to sit with fair balance for several minutes including RN medication administration. OT present for sit <> stand and step pivot transfers with RW. Initially minA-CGA but progressed to minAx2 with RW assist as well to succesfully step to recliner. Pt fatigued and reported significant pain.  Overall the patient demonstrated deficits (see "PT Problem List") that impede the patient's functional abilities, safety, and mobility and would benefit from skilled PT intervention.          If plan is discharge home, recommend the following: A lot of help with walking and/or transfers;A lot of help with bathing/dressing/bathroom;Assist for transportation;Direct supervision/assist for financial management;Assistance with cooking/housework;Help with stairs or ramp for entrance   Can travel by private vehicle        Equipment Recommendations Other (comment) (TBD at next level of care)  Recommendations for Other Services       Functional Status Assessment Patient has had a recent decline in their functional status and demonstrates the ability to make significant improvements in function in a reasonable and predictable amount of time.     Precautions / Restrictions Precautions Precautions: Fall Restrictions Weight Bearing Restrictions Per Provider Order: No      Mobility   Bed Mobility Overal bed mobility: Needs Assistance Bed Mobility: Rolling, Sidelying to Sit Rolling: Mod assist, Used rails Sidelying to sit: Mod assist, Used rails            Transfers Overall transfer level: Needs assistance Equipment used: Rolling walker (2 wheels) Transfers: Sit to/from Stand, Bed to chair/wheelchair/BSC Sit to Stand: From elevated surface, Min assist, Contact guard assist, +2 physical assistance, +2 safety/equipment   Step pivot transfers: Min assist, +2 safety/equipment, +2 physical assistance, From elevated surface       General transfer comment: Min A x1 and CGA x1 for STS from EOB to RW and Min A x2 for SPT to recliner with hands on assist to manuever walker and multi modal cueing with small shuffle steps    Ambulation/Gait                  Stairs            Wheelchair Mobility     Tilt Bed    Modified Rankin (Stroke Patients Only)       Balance Overall balance assessment: Needs assistance Sitting-balance support: Feet supported Sitting balance-Leahy Scale: Fair     Standing balance support: Reliant on assistive device for balance, Bilateral upper extremity supported Standing balance-Leahy Scale: Poor                               Pertinent Vitals/Pain Pain Assessment Pain Assessment: 0-10 Pain Score: 10-Worst pain ever Pain Location: incision site/abdomen Pain Descriptors / Indicators: Sore Pain Intervention(s): Limited activity within patient's tolerance, Monitored during session, Repositioned, Premedicated before session  Home Living Family/patient expects to be discharged to:: Skilled nursing facility Living Arrangements: Spouse/significant other;Children (sons) Available Help at Discharge: Family;Available 24 hours/day Type of Home: House           Home Equipment: Agricultural consultant (2 wheels);Hand held shower head      Prior Function Prior Level of Function : Independent/Modified Independent              Mobility Comments: MOD I/IND ADLs Comments: IND with ADLs, family assisted with all IADLs and drove him to appts, etc.     Extremity/Trunk Assessment   Upper Extremity Assessment Upper Extremity Assessment: Defer to OT evaluation    Lower Extremity Assessment Lower Extremity Assessment: Generalized weakness (able to move BLE off bed but limited due to abdominal pain)       Communication   Communication Communication: No apparent difficulties    Cognition Arousal: Alert Behavior During Therapy: WFL for tasks assessed/performed   PT - Cognitive impairments: No apparent impairments                           Following commands impaired: Follows one step commands with increased time     Cueing Cueing Techniques: Verbal cues, Tactile cues, Gestural cues     General Comments General comments (skin integrity, edema, etc.): NG tube and IV intact pre/post session    Exercises     Assessment/Plan    PT Assessment Patient needs continued PT services  PT Problem List Decreased strength;Decreased activity tolerance;Decreased balance;Decreased mobility;Decreased knowledge of precautions;Decreased knowledge of use of DME;Pain       PT Treatment Interventions DME instruction;Balance training;Gait training;Neuromuscular re-education;Stair training;Functional mobility training;Therapeutic activities;Therapeutic exercise    PT Goals (Current goals can be found in the Care Plan section)  Acute Rehab PT Goals Patient Stated Goal: to have less pain PT Goal Formulation: With patient Time For Goal Achievement: 01/15/24 Potential to Achieve Goals: Good    Frequency Min 1X/week     Co-evaluation PT/OT/SLP Co-Evaluation/Treatment: Yes Reason for Co-Treatment: For patient/therapist safety;To address functional/ADL transfers PT goals addressed during session: Mobility/safety with mobility OT goals addressed during session: ADL's and self-care        AM-PAC PT "6 Clicks" Mobility  Outcome Measure Help needed turning from your back to your side while in a flat bed without using bedrails?: A Lot Help needed moving from lying on your back to sitting on the side of a flat bed without using bedrails?: A Lot Help needed moving to and from a bed to a chair (including a wheelchair)?: A Lot Help needed standing up from a chair using your arms (e.g., wheelchair or bedside chair)?: A Lot Help needed to walk in hospital room?: A Lot Help needed climbing 3-5 steps with a railing? : Total 6 Click Score: 11    End of Session   Activity Tolerance: Patient limited by pain;Patient limited by fatigue Patient left: in chair;with call bell/phone within reach;with chair alarm set Nurse Communication: Mobility status PT Visit Diagnosis: Other abnormalities of gait and mobility (R26.89);Difficulty in walking, not elsewhere classified (R26.2);Muscle weakness (generalized) (M62.81);Pain Pain - Right/Left:  (midline) Pain - part of body:  (abdominal incision)    Time: 0865-7846 PT Time Calculation (min) (ACUTE ONLY): 23 min   Charges:   PT Evaluation $PT Eval Low Complexity: 1 Low PT Treatments $Therapeutic Activity: 8-22 mins PT General Charges $$ ACUTE PT VISIT: 1 Visit        Lafonda Mosses  Orvan Falconer PT, DPT 1:17 PM,01/01/24

## 2024-01-02 ENCOUNTER — Inpatient Hospital Stay: Payer: Medicare PPO

## 2024-01-02 DIAGNOSIS — K56609 Unspecified intestinal obstruction, unspecified as to partial versus complete obstruction: Secondary | ICD-10-CM | POA: Diagnosis not present

## 2024-01-02 LAB — BASIC METABOLIC PANEL
Anion gap: 9 (ref 5–15)
BUN: 23 mg/dL (ref 8–23)
CO2: 25 mmol/L (ref 22–32)
Calcium: 7.8 mg/dL — ABNORMAL LOW (ref 8.9–10.3)
Chloride: 106 mmol/L (ref 98–111)
Creatinine, Ser: 0.69 mg/dL (ref 0.61–1.24)
GFR, Estimated: >60 mL/min (ref >60)
Glucose, Bld: 119 mg/dL — ABNORMAL HIGH (ref 70–99)
Potassium: 4.2 mmol/L (ref 3.5–5.1)
Sodium: 140 mmol/L (ref 135–145)

## 2024-01-02 LAB — MAGNESIUM: Magnesium: 2.2 mg/dL (ref 1.7–2.4)

## 2024-01-02 LAB — CBC
HCT: 24.8 % — ABNORMAL LOW (ref 39.0–52.0)
Hemoglobin: 8.1 g/dL — ABNORMAL LOW (ref 13.0–17.0)
MCH: 27.9 pg (ref 26.0–34.0)
MCHC: 32.7 g/dL (ref 30.0–36.0)
MCV: 85.5 fL (ref 80.0–100.0)
Platelets: 279 10*3/uL (ref 150–400)
RBC: 2.9 MIL/uL — ABNORMAL LOW (ref 4.22–5.81)
RDW: 15.4 % (ref 11.5–15.5)
WBC: 11.2 10*3/uL — ABNORMAL HIGH (ref 4.0–10.5)
nRBC: 0 % (ref 0.0–0.2)

## 2024-01-02 LAB — TYPE AND SCREEN
ABO/RH(D): O POS
Antibody Screen: NEGATIVE
Unit division: 0
Unit division: 0

## 2024-01-02 LAB — BPAM RBC
Blood Product Expiration Date: 202503262359
Blood Product Expiration Date: 202503262359
Unit Type and Rh: 5100
Unit Type and Rh: 5100

## 2024-01-02 LAB — SURGICAL PATHOLOGY

## 2024-01-02 LAB — PHOSPHORUS: Phosphorus: 2.6 mg/dL (ref 2.5–4.6)

## 2024-01-02 LAB — GLUCOSE, CAPILLARY
Glucose-Capillary: 115 mg/dL — ABNORMAL HIGH (ref 70–99)
Glucose-Capillary: 107 mg/dL — ABNORMAL HIGH (ref 70–99)
Glucose-Capillary: 131 mg/dL — ABNORMAL HIGH (ref 70–99)
Glucose-Capillary: 150 mg/dL — ABNORMAL HIGH (ref 70–99)
Glucose-Capillary: 175 mg/dL — ABNORMAL HIGH (ref 70–99)

## 2024-01-02 LAB — PREPARE RBC (CROSSMATCH)

## 2024-01-02 MED ORDER — LACTATED RINGERS IV SOLN: INTRAVENOUS | Status: AC

## 2024-01-02 MED ORDER — ENOXAPARIN SODIUM 40 MG/0.4ML IJ SOSY
40.0000 mg | PREFILLED_SYRINGE | INTRAMUSCULAR | Status: DC
  Administered 2024-01-02 – 2024-01-03 (×2): 40 mg via SUBCUTANEOUS
  Filled 2024-01-02 (×2): qty 0.4

## 2024-01-02 NOTE — Progress Notes (Signed)
**Note Alex-Identified via Obfuscation**  Progress Note    Alex PASQUARIELLO Sr.  ZOX:[id number] DOB: [birth year]  DOA: 12/29/2023 PCP: [name], MD      Brief Narrative:    Medical records reviewed and are as summarized below:  Alex Burrs. is a 78 y.o. male with medical history significant for DM, HTN, Lewy body dementia, recently diagnosed adenocarcinoma of the colon on colonoscopy 12/20/2023 that was done for iron deficiency anemia and abdominal pain.  She saw her oncologist on 12/27/2023 to discuss therapy for newly diagnosed colon cancer.  He presented to the hospital with nausea, vomiting and worsening right lower quadrant abdominal pain.  He was found to have large bowel obstruction.     Assessment/Plan:   Principal Problem:   Large bowel obstruction (HCC) Active Problems:   HTN (hypertension), benign   Type 2 diabetes mellitus (HCC)   Malignant neoplasm of transverse colon (HCC)   Dementia with behavioral disturbance (HCC)   Electrolyte abnormality   Colon adenocarcinoma (HCC)   Body mass index is 20.13 kg/m.   Large bowel obstruction, recently diagnosed transverse colon adenocarcinoma: S/p ex laparotomy, right hemicolectomy with primary anastomosis on 12/31/2023.   NG tube has been clamped.  He is still NPO.  Continue IV fluids while NPO.  Change IV fluids from normal saline infusion with KCl to Ringer's lactate infusion.  IV morphine as needed for pain.     Acute postoperative anemia, chronic iron deficiency anemia: Probably from combination of blood loss from recent surgery from hemodilution from IV fluids.  No indication for blood transfusion at this time.  Monitor H&H. Ferritin was 10, iron sat duration ratio 17 and iron level 48 on 12/27/2023   Hypokalemia: Improved. Hyponatremia: Improved Monitor electrolytes and replete as needed   Type II DM: NovoLog correction scale as needed for hyperglycemia   Hypertension: BP i is elevated probably because of pain.  Use IV  hydralazine as needed for severe hypertension   Lewy body dementia: No acute issues.   Diet Order             Diet NPO time specified Except for: Other (See Comments), Sips with Meds  Diet effective now                            Consultants: General surgeon  Procedures: None    Medications:    enoxaparin (LOVENOX) injection  30 mg Subcutaneous Q24H   insulin aspart  0-15 Units Subcutaneous Q4H   pantoprazole (PROTONIX) IV  40 mg Intravenous Q12H   rivastigmine  3 mg Oral BID   Continuous Infusions:  0.9 % NaCl with KCl 20 mEq / L 100 mL/hr at 01/02/24 0537     Anti-infectives (From admission, onward)    Start     Dose/Rate Route Frequency Ordered Stop   01/01/24 0600  piperacillin-tazobactam (ZOSYN) IVPB 3.375 g        3.375 g 12.5 mL/hr over 240 Minutes Intravenous Every 8 hours 12/31/23 1427 01/02/24 0945   12/31/23 2200  metroNIDAZOLE (FLAGYL) tablet 1,000 mg  Status:  Discontinued        1,000 mg Oral  Once 12/30/23 1138 12/30/23 1832   12/31/23 2200  neomycin (MYCIFRADIN) tablet 1,000 mg  Status:  Discontinued        1,000 mg Oral  Once 12/30/23 1138 12/30/23 1832   12/31/23 1500  metroNIDAZOLE (FLAGYL) tablet 1,000 mg  Status:  Discontinued  1,000 mg Oral  Once 12/30/23 1138 12/30/23 1832   12/31/23 1500  neomycin (MYCIFRADIN) tablet 1,000 mg  Status:  Discontinued        1,000 mg Oral  Once 12/30/23 1138 12/30/23 1832   12/31/23 1400  metroNIDAZOLE (FLAGYL) tablet 1,000 mg  Status:  Discontinued        1,000 mg Oral  Once 12/30/23 1138 12/30/23 1832   12/31/23 1400  neomycin (MYCIFRADIN) tablet 1,000 mg  Status:  Discontinued        1,000 mg Oral  Once 12/30/23 1138 12/30/23 1832   12/31/23 0800  ertapenem (INVANZ) 1 g in sodium chloride 0.9 % 100 mL IVPB        1 g 200 mL/hr over 30 Minutes Intravenous  Once 12/30/23 1832 12/31/23 0858   12/30/23 2200  neomycin (MYCIFRADIN) tablet 1,000 mg  Status:  Discontinued        1,000 mg Oral   Once 12/30/23 0915 12/30/23 1138   12/30/23 2200  metroNIDAZOLE (FLAGYL) tablet 1,000 mg  Status:  Discontinued        1,000 mg Oral  Once 12/30/23 0915 12/30/23 1138   12/30/23 1500  neomycin (MYCIFRADIN) tablet 1,000 mg  Status:  Discontinued        1,000 mg Oral  Once 12/30/23 0915 12/30/23 1138   12/30/23 1500  metroNIDAZOLE (FLAGYL) tablet 1,000 mg  Status:  Discontinued        1,000 mg Oral  Once 12/30/23 0915 12/30/23 1138   12/30/23 1400  neomycin (MYCIFRADIN) tablet 1,000 mg  Status:  Discontinued        1,000 mg Oral  Once 12/30/23 0915 12/30/23 1138   12/30/23 1400  metroNIDAZOLE (FLAGYL) tablet 1,000 mg  Status:  Discontinued        1,000 mg Oral  Once 12/30/23 0915 12/30/23 1138              Family Communication/Anticipated D/C date and plan/Code Status   DVT prophylaxis: enoxaparin (LOVENOX) injection 30 mg Start: 01/01/24 0000 SCDs Start: 12/29/23 2147     Code Status: Full Code  Family Communication: Plan discussed with his wife and Alex Soto (son) at the bedside Disposition Plan: Plan to discharge home   Status is: Inpatient Remains inpatient appropriate because: Bowel obstruction s/p surgery       Subjective:   He complains of abdominal pain.  He wants something to eat.  His wife and son (Alex Soto) were at the bedside.  Objective:    Vitals:   01/01/24 1433 01/01/24 1959 01/02/24 0322 01/02/24 0750  BP: (!) 142/48 (!) 160/61 (!) 144/62 (!) 158/74  Pulse: 80 87 60 68  Resp: 20 18  18   Temp: 98.4 F (36.9 C) 98.2 F (36.8 C) 98.2 F (36.8 C) 98 F (36.7 C)  TempSrc:  Oral    SpO2: 97% 99% 99% 98%  Weight:      Height:       No data found.   Intake/Output Summary (Last 24 hours) at 01/02/2024 1311 Last data filed at 01/02/2024 1134 Gross per 24 hour  Intake 1751.37 ml  Output 1200 ml  Net 551.37 ml   Filed Weights   12/29/23 1418 12/30/23 0028  Weight: 70.8 kg 69.2 kg    Exam:   GEN: NAD SKIN: Warm and dry EYES: No pallor or  icterus ENT: MMM, NG tube in place CV: RRR PULM: CTA B ABD: soft, ND, NT, +BS CNS: AAO x 2 (person and place), non  focal EXT: No edema or tenderness      Data Reviewed:   I have personally reviewed following labs and imaging studies:  Labs: Labs show the following:   Basic Metabolic Panel: Recent Labs  Lab 12/29/23 1420 12/30/23 0607 12/31/23 0732 12/31/23 0738 01/01/24 0440 01/02/24 0422  NA 130* 134* 134* 135 137 140  K 3.2* 2.9* 3.7 3.7 4.2 4.2  CL 91* 100 101 98 106 106  CO2 26 26 25   --  23 25  GLUCOSE 194* 114* 103* 97 136* 119*  BUN 29* 25* 16 13 24* 23  CREATININE 0.78 0.75 0.72 0.80 0.87 0.69  CALCIUM 8.4* 7.9* 7.9*  --  7.6* 7.8*  MG  --  1.8 1.9  --  2.0 2.2  PHOS  --  2.5 2.4*  --  2.6 2.6   GFR Estimated Creatinine Clearance: 75.7 mL/min (by C-G formula based on SCr of 0.69 mg/dL). Liver Function Tests: Recent Labs  Lab 12/27/23 1515 12/29/23 1420 01/01/24 0440  AST 19 17  --   ALT 17 15  --   ALKPHOS 93 91  --   BILITOT 1.0 1.1  --   PROT 7.3 6.7  --   ALBUMIN 4.0 3.5 2.3*   Recent Labs  Lab 12/29/23 1420  LIPASE 20   No results for input(s): "AMMONIA" in the last 168 hours. Coagulation profile No results for input(s): "INR", "PROTIME" in the last 168 hours.  CBC: Recent Labs  Lab 12/27/23 1515 12/27/23 1515 12/29/23 1420 12/30/23 0607 12/31/23 0732 12/31/23 0738 01/01/24 0440 01/02/24 0422  WBC 5.1  --  3.8* 2.9* 3.2*  --  12.0* 11.2*  NEUTROABS 4.0  --   --   --  1.8  --   --   --   HGB 10.8*   < > 10.7* 9.0* 9.3* 9.2* 9.4* 8.1*  HCT 33.6*  --  32.2* 27.7* 28.5* 27.0* 27.9* 24.8*  MCV 85.7  --  85.0 85.8 86.6  --  84.5 85.5  PLT 271  --  299 244 240  --  295 279   < > = values in this interval not displayed.   Cardiac Enzymes: No results for input(s): "CKTOTAL", "CKMB", "CKMBINDEX", "TROPONINI" in the last 168 hours. BNP (last 3 results) No results for input(s): "PROBNP" in the last 8760 hours. CBG: Recent Labs   Lab 01/01/24 2011 01/01/24 2347 01/02/24 0347 01/02/24 0749 01/02/24 1145  GLUCAP 140* 87 115* 107* 131*   D-Dimer: No results for input(s): "DDIMER" in the last 72 hours. Hgb A1c: No results for input(s): "HGBA1C" in the last 72 hours.  Lipid Profile: No results for input(s): "CHOL", "HDL", "LDLCALC", "TRIG", "CHOLHDL", "LDLDIRECT" in the last 72 hours. Thyroid function studies: No results for input(s): "TSH", "T4TOTAL", "T3FREE", "THYROIDAB" in the last 72 hours.  Invalid input(s): "FREET3" Anemia work up: No results for input(s): "VITAMINB12", "FOLATE", "FERRITIN", "TIBC", "IRON", "RETICCTPCT" in the last 72 hours.  Sepsis Labs: Recent Labs  Lab 12/30/23 0607 12/31/23 0732 01/01/24 0440 01/02/24 0422  WBC 2.9* 3.2* 12.0* 11.2*    Microbiology No results found for this or any previous visit (from the past 240 hours).  Procedures and diagnostic studies:  DG Abd Portable 1V Result Date: 01/01/2024 CLINICAL DATA:  Nasogastric tube placement. Radiologic records indicates laparotomy yesterday. EXAM: PORTABLE ABDOMEN - 1 VIEW COMPARISON:  Radiograph 12/30/2023 FINDINGS: Tip and side port of the enteric tube below the diaphragm in the stomach. No bowel dilatation in the upper  abdomen. Midline abdominal staples seen. Free air under the right hemidiaphragm is not unexpected given recent abdominal surgery. IMPRESSION: 1. Tip and side port of the enteric tube below the diaphragm in the stomach. 2. Free air under the right hemidiaphragm is not unexpected given recent abdominal surgery. Electronically Signed   By: Narda Rutherford M.D.   On: 01/01/2024 21:39               LOS: 4 days   Jerid Catherman  [facility]   Pager on [email or web address]. If 7PM-7AM, please contact night-coverage at [email or web address]     01/02/2024, 1:11 PM

## 2024-01-02 NOTE — TOC Initial Note (Signed)
 Transition of Care (TOC) - Initial/Assessment Note    Patient Details  Name: Alex KLANG Sr. MRN: [id number] Date of Birth: 28-Feb-1946  Transition of Care [facility] CM/SW Contact:    Chapman Fitch, RN Phone Number: 01/02/2024, 3:09 PM  Clinical Narrative:                  Admitted for: SBO Admitted from: home with wife and sons [id number]  Current home health/prior home health/DME: RW   Therapy recommending SNF.  Patient defers conversation to wife Alex Soto.  She is in agreement to bedsearh, but states she wants to talk to her sons this evening before making any final decision.   PASRR obtained Fl2 sent for signature Bed search initiated         Patient Goals and CMS Choice            Expected Discharge Plan and Services                                              Prior Living Arrangements/Services                       Activities of Daily Living   ADL Screening (condition at time of admission) Independently performs ADLs?: Yes (appropriate for developmental age) Is the patient deaf or have difficulty hearing?: No Does the patient have difficulty seeing, even when wearing glasses/contacts?: No Does the patient have difficulty concentrating, remembering, or making decisions?: No  Permission Sought/Granted                  Emotional Assessment              Admission diagnosis:  Small bowel obstruction (HCC) [K56.609] Large bowel obstruction (HCC) [K56.609] Nausea and vomiting, unspecified vomiting type [R11.2] Patient Active Problem List   Diagnosis Date Noted   Colon adenocarcinoma (HCC) 12/30/2023   Large bowel obstruction (HCC) 12/29/2023   Dementia with behavioral disturbance (HCC) 12/29/2023   Electrolyte abnormality 12/29/2023   Malignant neoplasm of transverse colon (HCC) 12/27/2023   Osteomyelitis of third toe of right foot (HCC) 03/02/2020   Diabetic foot ulcer with osteomyelitis (HCC) 03/02/2020   Symptomatic  anemia 03/02/2020   Tendonitis of upper biceps tendon of right shoulder 10/10/2016   Complete tear of right rotator cuff 09/14/2016   Tendinitis of right rotator cuff 09/14/2016   Atherosclerosis of abdominal aorta (HCC) 08/16/2014   GERD (gastroesophageal reflux disease) 04/13/2014   HTN (hypertension), benign 04/13/2014   Hyperlipidemia, unspecified 04/13/2014   Type 2 diabetes mellitus (HCC) 04/13/2014   PCP:  [name], MD Pharmacy:   [id number] [place], [place] - [address] AT [facility] [address] [facility] [address] [place] [address] Phone: [phone] Fax: [phone]     Social Drivers of Health (SDOH) Social History: SDOH Screenings   Food Insecurity: No Food Insecurity (12/29/2023)  Housing: Low Risk  (12/29/2023)  Transportation Needs: No Transportation Needs (12/29/2023)  Utilities: Not At Risk (12/29/2023)  Depression (PHQ2-9): Low Risk  (12/27/2023)  Financial Resource Strain: Low Risk  (07/18/2023)   Received from [facility]  Social Connections: Moderately Integrated (12/30/2023)  Tobacco Use: Medium Risk (12/30/2023)   SDOH Interventions:     Readmission Risk Interventions     No data to  display

## 2024-01-02 NOTE — NC FL2 (Signed)
  MEDICAID FL2 LEVEL OF CARE FORM     IDENTIFICATION  Patient Name: Alex Soto. Birthdate: 1946-06-30 Sex: male Admission Date (Current Location): 12/29/2023  [facility] and [place] Number:  Chiropodist and Address:         Provider Number: [id number]  Attending Physician Name and Address:  Lurene Shadow, MD  Relative Name and Phone Number:       Current Level of Care: Hospital Recommended Level of Care: Skilled Nursing Facility Prior Approval Number:    Date Approved/Denied:   PASRR Number: [id number]  Discharge Plan: SNF    Current Diagnoses: Patient Active Problem List   Diagnosis Date Noted   Colon adenocarcinoma (HCC) 12/30/2023   Large bowel obstruction (HCC) 12/29/2023   Dementia with behavioral disturbance (HCC) 12/29/2023   Electrolyte abnormality 12/29/2023   Malignant neoplasm of transverse colon (HCC) 12/27/2023   Osteomyelitis of third toe of right foot (HCC) 03/02/2020   Diabetic foot ulcer with osteomyelitis (HCC) 03/02/2020   Symptomatic anemia 03/02/2020   Tendonitis of upper biceps tendon of right shoulder 10/10/2016   Complete tear of right rotator cuff 09/14/2016   Tendinitis of right rotator cuff 09/14/2016   Atherosclerosis of abdominal aorta (HCC) 08/16/2014   GERD (gastroesophageal reflux disease) 04/13/2014   HTN (hypertension), benign 04/13/2014   Hyperlipidemia, unspecified 04/13/2014   Type 2 diabetes mellitus (HCC) 04/13/2014    Orientation RESPIRATION BLADDER Height & Weight     Self, Situation, Place  Normal Continent Weight: 69.2 kg Height:  6\' 1"  (185.4 cm)  BEHAVIORAL SYMPTOMS/MOOD NEUROLOGICAL BOWEL NUTRITION STATUS      Continent Diet, NG/panda (Clears to be advanced prior to discharge. NG to be removed prior to discharge)  AMBULATORY STATUS COMMUNICATION OF NEEDS Skin   Extensive Assist Verbally Surgical wounds, Skin abrasions, Bruising                       Personal Care Assistance  Level of Assistance              Functional Limitations Info             SPECIAL CARE FACTORS FREQUENCY  PT (By licensed PT), OT (By licensed OT)                    Contractures Contractures Info: Not present    Additional Factors Info  Code Status, Allergies Code Status Info: full Allergies Info: NKDA           Current Medications (01/02/2024):  This is the current hospital active medication list Current Facility-Administered Medications  Medication Dose Route Frequency Provider Last Rate Last Admin   enoxaparin (LOVENOX) injection 40 mg  40 mg Subcutaneous Q24H Lurene Shadow, MD       hydrALAZINE (APRESOLINE) injection 5 mg  5 mg Intravenous Q6H PRN Kandis Cocking, MD       insulin aspart (novoLOG) injection 0-15 Units  0-15 Units Subcutaneous Q4H Kandis Cocking, MD   2 Units at 01/02/24 1257   lactated ringers infusion   Intravenous Continuous Lurene Shadow, MD 75 mL/hr at 01/02/24 1453 New Bag at 01/02/24 1453   methocarbamol (ROBAXIN) injection 500 mg  500 mg Intravenous Q8H PRN Kandis Cocking, MD       morphine (PF) 2 MG/ML injection 2 mg  2 mg Intravenous Q2H PRN Kandis Cocking, MD   2 mg at 01/01/24 2015   ondansetron [facility]) injection 4 mg  4 mg Intravenous Q4H PRN Kandis Cocking, MD       pantoprazole (PROTONIX) injection 40 mg  40 mg Intravenous Q12H Donovan Kail, PA-C   40 mg at 01/02/24 1914   rivastigmine (EXELON) capsule 3 mg  3 mg Oral BID Kandis Cocking, MD   3 mg at 01/02/24 1146     Discharge Medications: Please see discharge summary for a list of discharge medications.  Relevant Imaging Results:  Relevant Lab Results:   Additional Information ss [id number]  Chapman Fitch, RN

## 2024-01-02 NOTE — Progress Notes (Addendum)
 Patient seen and evaluated with Mr. Manus Rudd and agree with his note.  Patient reports passing flatus.  NG output 950 but becoming lighter color.  Pain controlled.  Abdomen soft, non-distended, appropriately tender.  Incision clean, dry, intact.  Dressing with old blood residue.  Will try clamping trial today. If low residual, would be able to d/c NG tube and start clears.  Henrene Dodge, MD     [facility] SURGICAL PROGRESS NOTE  Hospital Day(s): 4.   Post op day(s): 2 Days Post-Op.   Interval History:  Patient seen and examined No acute events or new complaints overnight.  Patient reports abdominal soreness is improved No fever, chills, emesis Leukocytosis improved; 11.2K Hgb 8.1 - suspect dilutional Renal function normal; sCr - 0.69; UO - 275 ccs + unmeasured  No electrolyte derangements  NGT in place; 950 ccs He reports flatus overnight and son confirms this   Vital signs in last 24 hours: [min-max] current  Temp:  [98.2 F (36.8 C)-99.1 F (37.3 C)] 98.2 F (36.8 C) (03/04 0322) Pulse Rate:  [60-87] 60 (03/04 0322) Resp:  [16-20] 18 (03/03 1959) BP: (142-160)/(48-69) 144/62 (03/04 0322) SpO2:  [97 %-99 %] 99 % (03/04 0322)     Height: 6\' 1"  (185.4 cm) Weight: 69.2 kg BMI (Calculated): 20.13   Intake/Output last 2 shifts:  03/03 0701 - 03/04 0700 In: 1751.4 [I.V.:1256.4; IV Piggyback:494.9] Out: 1225 [Urine:275; Emesis/NG output:950]   Physical Exam:  Constitutional: alert, cooperative and no distress  HEENT: NGT in place; output thin  Respiratory: breathing non-labored at rest  Cardiovascular: regular rate and sinus rhythm  Gastrointestinal: soft, incisional tenderness, and non-distended, no rebound/guarding Integumentary: Laparotomy is CDI with staples and honeycomb; no erythema  Labs:     Latest Ref Rng & Units 01/02/2024    4:22 AM 01/01/2024    4:40 AM 12/31/2023    7:38 AM  CBC  WBC 4.0 - 10.5 K/uL 11.2  12.0    Hemoglobin 13.0 - 17.0 g/dL  8.1  9.4  9.2   Hematocrit 39.0 - 52.0 % 24.8  27.9  27.0   Platelets 150 - 400 K/uL 279  295        Latest Ref Rng & Units 01/02/2024    4:22 AM 01/01/2024    4:40 AM 12/31/2023    7:38 AM  CMP  Glucose 70 - 99 mg/dL 409  811  97   BUN 8 - 23 mg/dL 23  24  13    Creatinine 0.61 - 1.24 mg/dL 9.14  7.82  9.56   Sodium 135 - 145 mmol/L 140  137  135   Potassium 3.5 - 5.1 mmol/L 4.2  4.2  3.7   Chloride 98 - 111 mmol/L 106  106  98   CO2 22 - 32 mmol/L 25  23    Calcium 8.9 - 10.3 mg/dL 7.8  7.6       Imaging studies: No new pertinent imaging studies   Assessment/Plan:  78 y.o. male 2 Days Post-Op s/p right hemicolectomy for colon CA   - Given return of bowel function and thinning NGT output, we will proceed with 4 hour NGT clamping trial. If residuals are <150 ccs, we can remove this.   - NPO for now pending NGT clamp trial. If passes, can do CLD cautiously   - Monitor abdominal examination; on-going bowel function   - Pain control prn; Antiemetics prn  - Monitor leukocytosis; improved  - Monitor H&H  - Mobilize; PT/OT  order placed  - Follow up surgical pathology when available   - Further management per primary service; we will follow   All of the above findings and recommendations were discussed with the patient, patient's family (son at bedside), and the medical team, and all of patient's questions were answered to their expressed satisfaction.  -- Lynden Oxford, PA-C [facility] 01/02/2024, 7:06 AM M-F: 7am - 4pm

## 2024-01-02 NOTE — Plan of Care (Signed)

## 2024-01-03 DIAGNOSIS — Z515 Encounter for palliative care: Secondary | ICD-10-CM | POA: Diagnosis not present

## 2024-01-03 DIAGNOSIS — K56609 Unspecified intestinal obstruction, unspecified as to partial versus complete obstruction: Secondary | ICD-10-CM | POA: Diagnosis not present

## 2024-01-03 DIAGNOSIS — F03918 Unspecified dementia, unspecified severity, with other behavioral disturbance: Secondary | ICD-10-CM | POA: Diagnosis not present

## 2024-01-03 DIAGNOSIS — C189 Malignant neoplasm of colon, unspecified: Secondary | ICD-10-CM | POA: Diagnosis not present

## 2024-01-03 LAB — BASIC METABOLIC PANEL
Anion gap: 6 (ref 5–15)
BUN: 14 mg/dL (ref 8–23)
CO2: 27 mmol/L (ref 22–32)
Calcium: 7.9 mg/dL — ABNORMAL LOW (ref 8.9–10.3)
Chloride: 103 mmol/L (ref 98–111)
Creatinine, Ser: 0.66 mg/dL (ref 0.61–1.24)
GFR, Estimated: >60 mL/min (ref >60)
Glucose, Bld: 126 mg/dL — ABNORMAL HIGH (ref 70–99)
Potassium: 4.1 mmol/L (ref 3.5–5.1)
Sodium: 136 mmol/L (ref 135–145)

## 2024-01-03 LAB — GLUCOSE, CAPILLARY
Glucose-Capillary: 120 mg/dL — ABNORMAL HIGH (ref 70–99)
Glucose-Capillary: 127 mg/dL — ABNORMAL HIGH (ref 70–99)
Glucose-Capillary: 127 mg/dL — ABNORMAL HIGH (ref 70–99)
Glucose-Capillary: 132 mg/dL — ABNORMAL HIGH (ref 70–99)
Glucose-Capillary: 166 mg/dL — ABNORMAL HIGH (ref 70–99)
Glucose-Capillary: 186 mg/dL — ABNORMAL HIGH (ref 70–99)

## 2024-01-03 LAB — CBC
HCT: 27.2 % — ABNORMAL LOW (ref 39.0–52.0)
Hemoglobin: 8.9 g/dL — ABNORMAL LOW (ref 13.0–17.0)
MCH: 27.7 pg (ref 26.0–34.0)
MCHC: 32.7 g/dL (ref 30.0–36.0)
MCV: 84.7 fL (ref 80.0–100.0)
Platelets: 284 10*3/uL (ref 150–400)
RBC: 3.21 MIL/uL — ABNORMAL LOW (ref 4.22–5.81)
RDW: 15.5 % (ref 11.5–15.5)
WBC: 9.5 10*3/uL (ref 4.0–10.5)
nRBC: 0 % (ref 0.0–0.2)

## 2024-01-03 MED ORDER — ACETAMINOPHEN 325 MG PO TABS
650.0000 mg | ORAL_TABLET | Freq: Four times a day (QID) | ORAL | Status: DC | PRN
  Administered 2024-01-03: 650 mg via ORAL
  Filled 2024-01-03: qty 2

## 2024-01-03 MED ORDER — METHOCARBAMOL 500 MG PO TABS
500.0000 mg | ORAL_TABLET | Freq: Three times a day (TID) | ORAL | Status: DC | PRN
Start: 2024-01-03 — End: 2024-01-04
  Administered 2024-01-03 (×2): 500 mg via ORAL
  Filled 2024-01-03 (×2): qty 1

## 2024-01-03 MED ORDER — BOOST / RESOURCE BREEZE PO LIQD CUSTOM: 1.0000 | Freq: Three times a day (TID) | ORAL | Status: DC

## 2024-01-03 MED ORDER — OXYCODONE HCL 5 MG PO TABS
5.0000 mg | ORAL_TABLET | ORAL | Status: DC | PRN
  Administered 2024-01-03 – 2024-01-04 (×2): 5 mg via ORAL
  Filled 2024-01-03 (×2): qty 1

## 2024-01-03 MED ORDER — ADULT MULTIVITAMIN W/MINERALS CH
1.0000 | ORAL_TABLET | Freq: Every day | ORAL | Status: DC
Start: 2024-01-04 — End: 2024-01-04

## 2024-01-03 MED ORDER — INSULIN ASPART 100 UNIT/ML IJ SOLN
0.0000 [IU] | Freq: Three times a day (TID) | INTRAMUSCULAR | Status: DC
  Administered 2024-01-03 – 2024-01-04 (×3): 2 [IU] via SUBCUTANEOUS
  Filled 2024-01-03 (×3): qty 1

## 2024-01-03 MED ORDER — PANTOPRAZOLE SODIUM 40 MG PO TBEC
40.0000 mg | DELAYED_RELEASE_TABLET | Freq: Two times a day (BID) | ORAL | Status: DC
  Administered 2024-01-03: 40 mg via ORAL
  Filled 2024-01-03: qty 1

## 2024-01-03 NOTE — Progress Notes (Signed)
 Physical Therapy Treatment Patient Details Name: Alex ROMANOFF Sr. MRN: [id number] DOB: Mar 28, 1946 Today's Date: 01/03/2024   History of Present Illness Alex Capelli  Sr. is a 78yoM t is a 78 yo s/p exploratory laparatomy and colectomy. 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    PT Comments  Pt resting in bed on entry, pain remains fairly intense, family at bedside. Pt up to chair earlier today. Pt assisted with log roll, minA for STS, but able to rise from bed elevation. Pt able to AMB 2 bouts, clearly limited by pain/antalgic, but says he's doing ok with AMB. RW use is not particularly safe, lots and lots of verbal cues given to maximize RW for safety as well as pain control. Pt in bed at end of session, HOB >30 degrees.    If plan is discharge home, recommend the following: A lot of help with walking and/or transfers;A lot of help with bathing/dressing/bathroom;Assist for transportation;Direct supervision/assist for financial management;Assistance with cooking/housework;Help with stairs or ramp for entrance   Can travel by private vehicle        Equipment Recommendations  None recommended by PT    Recommendations for Other Services       Precautions / Restrictions Precautions Precautions: Fall Restrictions Weight Bearing Restrictions Per Provider Order: No     Mobility  Bed Mobility Overal bed mobility: Needs Assistance Bed Mobility: Supine to Sit, Sit to Supine, Rolling, Sidelying to Sit, Sit to Sidelying Rolling: Contact guard assist, Supervision Sidelying to sit: Min assist     Sit to sidelying: Mod assist      Transfers Overall transfer level: Needs assistance Equipment used: Rolling walker (2 wheels) Transfers: Sit to/from Stand Sit to Stand: From elevated surface           General transfer comment: fairly painful but able to rise steady    Ambulation/Gait Ambulation/Gait assistance: Contact guard assist Gait Distance  (Feet): 40 Feet Assistive device: Rolling walker (2 wheels)         General Gait Details: lots of cues for safe use of RW, very far anterior and feet often outside of walker base   Stairs             Wheelchair Mobility     Tilt Bed    Modified Rankin (Stroke Patients Only)       Balance                                            Communication    Cognition                                        Cueing    Exercises Other Exercises Other Exercises: AMB 2x, seated recoveru bout    General Comments        Pertinent Vitals/Pain Pain Assessment Pain Assessment: Faces Faces Pain Scale: Hurts even more Pain Location: incision site/abdomen Pain Intervention(s): Limited activity within patient's tolerance, Premedicated before session    Home Living                          Prior Function            PT Goals (current goals can now  be found in the care plan section) Acute Rehab PT Goals Patient Stated Goal: to have less pain PT Goal Formulation: With patient Time For Goal Achievement: 01/15/24 Potential to Achieve Goals: Good Progress towards PT goals: Progressing toward goals    Frequency    Min 1X/week      PT Plan      Co-evaluation              AM-PAC PT "6 Clicks" Mobility   Outcome Measure  Help needed turning from your back to your side while in a flat bed without using bedrails?: A Lot Help needed moving from lying on your back to sitting on the side of a flat bed without using bedrails?: A Lot Help needed moving to and from a bed to a chair (including a wheelchair)?: A Lot Help needed standing up from a chair using your arms (e.g., wheelchair or bedside chair)?: A Lot Help needed to walk in hospital room?: A Little Help needed climbing 3-5 steps with a railing? : A Lot 6 Click Score: 13    End of Session Equipment Utilized During Treatment: Gait belt Activity Tolerance: Patient  limited by pain;Patient limited by fatigue;Patient tolerated treatment well Patient left: with call bell/phone within reach;in bed;with family/visitor present Nurse Communication: Mobility status PT Visit Diagnosis: Other abnormalities of gait and mobility (R26.89);Difficulty in walking, not elsewhere classified (R26.2);Muscle weakness (generalized) (M62.81);Pain     Time: 1610-9604 PT Time Calculation (min) (ACUTE ONLY): 19 min  Charges:    $Therapeutic Activity: 8-22 mins PT General Charges $$ ACUTE PT VISIT: 1 Visit                    3:56 PM, 01/03/24 Rosamaria Lints, PT, DPT Physical Therapist - [facility]  [phone] (ASCOM)    Alex Soto 01/03/2024, 3:54 PM

## 2024-01-03 NOTE — Progress Notes (Signed)
 Occupational Therapy Treatment Patient Details Name: Alex SMOLINSKY Sr. MRN: [id number] DOB: 01/06/1946 Today's Date: 01/03/2024   History of present illness Pt is a 78 yo s/p exploratory laparatomy and colectomy. 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   OT comments  Pt is supine in bed on arrival. Pleasant and agreeable to OT session. He is reporting increased pain although nursing provides meds prior to session. Pt performed bed mobility with Min/Mod A for trunkal elevation and cueing for logroll technique. Pt scooted to EOB to attempt using urinal with CGA for seated balance. Min A for STS from EOB with it elevated to RW and Min A for SPT to recliner via small, shuffle steps. Pt declined any further activity d/t pain, notified RN. Pt left in recliner with all needs in place and will cont to require skilled acute OT services to maximize his safety and IND to return to PLOF.       If plan is discharge home, recommend the following:  A lot of help with bathing/dressing/bathroom;A lot of help with walking and/or transfers;Assistance with cooking/housework;Assist for transportation;Help with stairs or ramp for entrance   Equipment Recommendations  BSC/3in1;Other (comment) (defer)    Recommendations for Other Services      Precautions / Restrictions Precautions Precautions: Fall Restrictions Weight Bearing Restrictions Per Provider Order: No       Mobility Bed Mobility Overal bed mobility: Needs Assistance Bed Mobility: Rolling, Sidelying to Sit Rolling: Mod assist, Used rails, Min assist Sidelying to sit: Mod assist, Used rails       General bed mobility comments: pt moves BLEs well, needs assist for trunkal elevation and cueing for log roll technique    Transfers Overall transfer level: Needs assistance Equipment used: Rolling walker (2 wheels) Transfers: Sit to/from Stand, Bed to chair/wheelchair/BSC Sit to Stand: From elevated surface,  Min assist     Step pivot transfers: Min assist, From elevated surface     General transfer comment: Min A x1 with bed elevated for STS To RW and Min A with increased time and cueing d/t small shuffle steps to get to recliner; pt managed walker better today     Balance Overall balance assessment: Needs assistance Sitting-balance support: Feet supported Sitting balance-Leahy Scale: Fair Sitting balance - Comments: no LOB while seated EOB but CGA provided using urinal   Standing balance support: Reliant on assistive device for balance, Bilateral upper extremity supported Standing balance-Leahy Scale: Poor Standing balance comment: BUE dependence on RW and CGA /Min A for external support                           ADL either performed or assessed with clinical judgement   ADL Overall ADL's : Needs assistance/impaired                         Toilet Transfer: Rolling walker (2 wheels);Minimal assistance Toilet Transfer Details (indicate cue type and reason): simulated to recliner Toileting- Clothing Manipulation and Hygiene: Contact guard assist;Sitting/lateral lean Toileting - Clothing Manipulation Details (indicate cue type and reason): seated EOB using urinal            Extremity/Trunk Assessment              Vision       Perception     Praxis     Communication Communication Communication: No apparent difficulties   Cognition Arousal:  Alert Behavior During Therapy: [facility] for tasks assessed/performed                                 Following commands: Impaired Following commands impaired: Follows one step commands with increased time      Cueing   Cueing Techniques: Verbal cues, Tactile cues, Gestural cues  Exercises Other Exercises Other Exercises: notified nurse of continued pain by patient although he has had all possible pain meds.    Shoulder Instructions       General Comments IV intact pre/post session     Pertinent Vitals/ Pain       Pain Assessment Pain Assessment: Faces Faces Pain Scale: Hurts whole lot Pain Location: incision site/abdomen Pain Descriptors / Indicators: Sore Pain Intervention(s): Premedicated before session, Repositioned, Monitored during session (nurse notified of continued pain)  Home Living                                          Prior Functioning/Environment              Frequency  Min 2X/week        Progress Toward Goals  OT Goals(current goals can now be found in the care plan section)  Progress towards OT goals: Progressing toward goals  Acute Rehab OT Goals Patient Stated Goal: improve pain OT Goal Formulation: With patient/family Time For Goal Achievement: 01/15/24 Potential to Achieve Goals: Fair  Plan      Co-evaluation                 AM-PAC OT "6 Clicks" Daily Activity     Outcome Measure   Help from another person eating meals?: None Help from another person taking care of personal grooming?: A Little Help from another person toileting, which includes using toliet, bedpan, or urinal?: A Lot Help from another person bathing (including washing, rinsing, drying)?: A Lot Help from another person to put on and taking off regular upper body clothing?: A Lot Help from another person to put on and taking off regular lower body clothing?: A Lot 6 Click Score: 15    End of Session Equipment Utilized During Treatment: Rolling walker (2 wheels)  OT Visit Diagnosis: Other abnormalities of gait and mobility (R26.89);Muscle weakness (generalized) (M62.81);Pain   Activity Tolerance Patient tolerated treatment well;Patient limited by pain   Patient Left in chair;with call bell/phone within reach;with chair alarm set;with family/visitor present   Nurse Communication Mobility status        Time: (P) 1059-(P) 1116 OT Time Calculation (min): (P) 17 min  Charges: OT General Charges $OT Visit: (P) 1 Visit OT  Treatments $Therapeutic Activity: (P) 8-22 mins  Chael Urenda, OTR/L  01/03/24, 12:59 PM   Gaye Scorza E Jovany Disano 01/03/2024, 12:59 PM

## 2024-01-03 NOTE — Hospital Course (Signed)
 Alex SCHLAFER Sr. is a 78 y.o. male with medical history significant for DM, HTN, Lewy body dementia, recently diagnosed adenocarcinoma of the colon on colonoscopy 12/20/2023 that was done for iron deficiency anemia and abdominal pain.  She saw her oncologist on 12/27/2023 to discuss therapy for newly diagnosed colon cancer.  He presented to the hospital with nausea, vomiting and worsening right lower quadrant abdominal pain.  He was found to have large bowel obstruction. S/p ex laparotomy, right hemicolectomy with primary anastomosis on 12/31/2023.  Pathology came back with colon adenocarcinoma.  Patient had a large bowel movement on 3/4, but still has significant abdominal pain and distention.   Patient developed significant hypotension on 3/6, received a liter of fluid bolus.  He also developed atrial fibrillation with RVR, started on amnio drip, given digoxin.  Cardiology consult obtained.

## 2024-01-03 NOTE — Progress Notes (Signed)
  Progress Note   Patient: Alex WOMBLES Sr. WUJ:[id number] DOB: 1946-02-14 DOA: 12/29/2023     5 DOS: the patient was seen and examined on 01/03/2024   Brief hospital course: Brayden Brodhead. is a 78 y.o. male with medical history significant for DM, HTN, Lewy body dementia, recently diagnosed adenocarcinoma of the colon on colonoscopy 12/20/2023 that was done for iron deficiency anemia and abdominal pain.  She saw her oncologist on 12/27/2023 to discuss therapy for newly diagnosed colon cancer.  He presented to the hospital with nausea, vomiting and worsening right lower quadrant abdominal pain.  He was found to have large bowel obstruction. S/p ex laparotomy, right hemicolectomy with primary anastomosis on 12/31/2023.  Pathology came back with colon adenocarcinoma.  Patient had a large bowel movement on 3/4, but still has significant abdominal pain and distention.     Principal Problem:   Large bowel obstruction (HCC) Active Problems:   HTN (hypertension), benign   Type 2 diabetes mellitus (HCC)   Malignant neoplasm of transverse colon (HCC)   Dementia with behavioral disturbance (HCC)   Electrolyte abnormality   Colon adenocarcinoma [facility])   Palliative care encounter   Assessment and Plan: Large bowel obstruction secondary to colon cancer.  Status post right hemicolectomy. Stage IIa colon adenocarcinoma. Patient had large bowel movement yesterday, still complaining of abdominal pain and distention.  But the patient is tolerating liquid diet Continue symptomatic treatment.  Acute blood loss anemia secondary surgery. Chronic iron deficient anemia. Continue to follow.  Hypokalemia Hyponatremia. Condition improved  Type 2 diabetes Continue sliding scale insulin, changed to ACHS.  Essential hypertension. Blood pressure is mildly elevated.  Continue to follow-up  Lewy body dementia. Follow-up with neurology as outpatient.     Subjective:  Patient still complaining  significant abdominal distention and intermittent abdominal pain.  He had a large bowel movement yesterday.  Physical Exam: Vitals:   01/02/24 1627 01/02/24 1949 01/03/24 0332 01/03/24 0730  BP: (!) 166/86 (!) 161/64 (!) 161/77 (!) 148/80  Pulse: 87 89 94   Resp: 18 18 20 18   Temp: 98 F (36.7 C) 98.9 F (37.2 C) 98.6 F (37 C) 98.5 F (36.9 C)  TempSrc:  Oral  Oral  SpO2: 99% 97% 95% 95%  Weight:      Height:       General exam: Appears calm and comfortable  Respiratory system: Clear to auscultation. Respiratory effort normal. Cardiovascular system: S1 & S2 heard, RRR. No JVD, murmurs, rubs, gallops or clicks. No pedal edema. Gastrointestinal system: Abdomen is nondistended, soft and tender. No organomegaly or masses felt. Normal bowel sounds heard. Central nervous system: Alert and oriented. No focal neurological deficits. Extremities: Symmetric 5 x 5 power. Skin: No rashes, lesions or ulcers Psychiatry: Judgement and insight appear normal. Mood & affect appropriate.    Data Reviewed:  Lab results reviewed.  Family Communication: Wife updated at bedside.  Disposition: Status is: Inpatient Remains inpatient appropriate because: Severity of disease, IV treatment.     Time spent: 35 minutes  Author: Marrion Coy, MD 01/03/2024 2:54 PM  For on call review [email or web address].

## 2024-01-03 NOTE — Progress Notes (Signed)
 [facility] SURGICAL PROGRESS NOTE  Hospital Day(s): 5.   Post op day(s): 3 Days Post-Op.   Interval History:  Patient seen and examined No acute events or new complaints overnight.  Patient reports he is having more pain in his belly No fever, chills, nausea, emesis Leukocytosis resolved; 9.5K Hgb 8.9 - improved Renal function normal; sCr - 0.66; UO - 1475 ccs + unmeasured  No electrolyte derangements  NGT removed yesterday after passing gravity trial  He is on CLD; tolerating + bowel function; BM recorded Has not been out of bed  Vital signs in last 24 hours: [min-max] current  Temp:  [98 F (36.7 C)-98.9 F (37.2 C)] 98.6 F (37 C) (03/05 0332) Pulse Rate:  [68-94] 94 (03/05 0332) Resp:  [18-20] 20 (03/05 0332) BP: (158-166)/(64-86) 161/77 (03/05 0332) SpO2:  [95 %-99 %] 95 % (03/05 0332)     Height: 6\' 1"  (185.4 cm) Weight: 69.2 kg BMI (Calculated): 20.13   Intake/Output last 2 shifts:  03/04 0701 - 03/05 0700 In: 100 [P.O.:100] Out: 1500 [Urine:1475; Emesis/NG output:25]   Physical Exam:  Constitutional: alert, cooperative and no distress  Respiratory: breathing non-labored at rest  Cardiovascular: regular rate and sinus rhythm  Gastrointestinal: soft, incisional tenderness, mild distension, no rebound/guarding Integumentary: Laparotomy is CDI with staples and honeycomb; no erythema  Labs:     Latest Ref Rng & Units 01/03/2024    4:36 AM 01/02/2024    4:22 AM 01/01/2024    4:40 AM  CBC  WBC 4.0 - 10.5 K/uL 9.5  11.2  12.0   Hemoglobin 13.0 - 17.0 g/dL 8.9  8.1  9.4   Hematocrit 39.0 - 52.0 % 27.2  24.8  27.9   Platelets 150 - 400 K/uL 284  279  295       Latest Ref Rng & Units 01/03/2024    4:36 AM 01/02/2024    4:22 AM 01/01/2024    4:40 AM  CMP  Glucose 70 - 99 mg/dL 161  096  045   BUN 8 - 23 mg/dL 14  23  24    Creatinine 0.61 - 1.24 mg/dL 4.09  8.11  9.14   Sodium 135 - 145 mmol/L 136  140  137   Potassium 3.5 - 5.1 mmol/L 4.1  4.2  4.2    Chloride 98 - 111 mmol/L 103  106  106   CO2 22 - 32 mmol/L 27  25  23    Calcium 8.9 - 10.3 mg/dL 7.9  7.8  7.6      Imaging studies: No new pertinent imaging studies   Assessment/Plan:  78 y.o. male 3 Days Post-Op s/p right hemicolectomy for colon CA   - He is slightly bloated this AM, we will keep on CLD for now. I will add Boost supplementation    - Monitor abdominal examination; on-going bowel function   - Pain control prn; added PO medications  - Antiemetics prn  - Monitor leukocytosis; resolved  - Monitor H&H  - Mobilize; PT/OT - needs to ambulate   - Pathology: Invasive adenocarcinoma, 0/22 lymph nodes   - Further management per primary service; we will follow   All of the above findings and recommendations were discussed with the patient, patient's family (wife and son at bedside), and the medical team, and all of patient's questions were answered to their expressed satisfaction.  -- Lynden Oxford, PA-C [facility] 01/03/2024, 7:23 AM M-F: 7am - 4pm

## 2024-01-03 NOTE — Consult Note (Signed)
 Palliative Medicine [facility] at [facility] Telephone:([phone]) [phone] Fax:(336) [phone]   Name: Alex Soto. Date: 01/03/2024 MRN: [id number]  DOB: December 11, 1945  Patient Care Team: [name], MD as PCP - General (Internal Medicine) Benita Gutter, RN as Oncology Nurse Navigator Earna Coder, MD as Consulting Physician (Oncology)    REASON FOR CONSULTATION: Alex Soto. is a 78 y.o. male with multiple medical problems including diabetes, hypertension, Lewy body dementia, IDA, and recently diagnosed adenocarcinoma of the colon.  Patient was admitted to hospital on 12/21/2023 with an acute obstruction.  He underwent right hemicolectomy with anastomosis.  Palliative care consulted to address goals.  SOCIAL HISTORY:     reports that he quit smoking about 54 years ago. His smoking use included cigarettes. He started smoking about 64 years ago. He has a 2.5 pack-year smoking history. He has been exposed to tobacco smoke. He has quit using smokeless tobacco. He reports that he does not drink alcohol and does not use drugs.  Patient is married lives at home with his wife and son.  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  Only wears upper    PAST SURGICAL HISTORY:  Past Surgical History:  Procedure Laterality Date   AMPUTATION TOE Right 03/01/2020   Procedure: AMPUTATION TOE;  Surgeon: [name], DPM;  Location: [facility] ORS;  Service: Podiatry;  Laterality: Right;   AMPUTATION TOE Right 01/03/2023   Procedure: AMPUTATION TOE, 2nd right;  Surgeon: [name], DPM;  Location: [facility];  Service: Podiatry;  Laterality: Right;   APPENDECTOMY     BIOPSY  12/20/2023   Procedure: BIOPSY;  Surgeon: Norma Fredrickson, Boykin Nearing, MD;  Location: [facility] ENDOSCOPY;  Service: Gastroenterology;;   BONE EXCISION Right 01/03/2023   Procedure: Exostectomy interphalangeal joint right great toe;  Surgeon: [name], DPM;  Location: [facility] ORS;  Service: Podiatry;  Laterality: Right;   CATARACT EXTRACTION W/PHACO Left 09/06/2021   Procedure: CATARACT EXTRACTION PHACO AND INTRAOCULAR LENS PLACEMENT (IOC) LEFT 2.71 00:30.6;  Surgeon: Nevada Crane, MD;  Location: [facility] SUR[facility]ERY [facility];  Service: Ophthalmology;  Laterality: Left;  Diabetic   COLONOSCOPY WITH PROPOFOL N/A 12/20/2023   Procedure: COLONOSCOPY WITH PROPOFOL;  Surgeon: Toledo, Boykin Nearing, MD;  Location: [facility] ENDOSCOPY;  Service: Gastroenterology;  Laterality: N/A;   ESOPHAGOGASTRODUODENOSCOPY (EGD) WITH PROPOFOL N/A 12/20/2023   Procedure: ESOPHAGOGASTRODUODENOSCOPY (EGD) WITH PROPOFOL;  Surgeon: Toledo, Boykin Nearing, MD;  Location: [facility] ENDOSCOPY;  Service: Gastroenterology;  Laterality: N/A;   EYE SURGERY     age 54   HERNIA REPAIR     INSERTION OF MESH  06/23/2023  Procedure: INSERTION OF MESH;  Surgeon: Sung Amabile, DO;  Location: [facility] ORS;  Service: General;;   KIDNEY STONE SURGERY     LAPAROTOMY Right 12/31/2023   Procedure: EXPLORATORY LAPAROTOMY WITH RIGHT COLECTOMY;  Surgeon: Kandis Cocking, MD;  Location: [facility] ORS;  Service: General;  Laterality: Right;   REMOVAL RETAINED LENS Right 04/19/2021   Procedure: CATARACT EXTRACTION PHACO,  RIGHT  VISION BLUE 0.20 00:01.4 case aborted;  Surgeon: Nevada Crane, MD;  Location: [facility];  Service: Ophthalmology;  Laterality: Right;  Diabetic - oral meds   SHOULDER ARTHROSCOPY WITH BICEPS TENDON REPAIR Right 10/06/2016   Procedure: SHOULDER ARTHROSCOPY WITH BICEPS TENDON REPAIR;  Surgeon: Christena Flake, MD;  Location: [facility] ORS;  Service: Orthopedics;  Laterality: Right;   SHOULDER ARTHROSCOPY WITH OPEN ROTATOR CUFF REPAIR Right 10/06/2016   Procedure: SHOULDER ARTHROSCOPY WITH OPEN ROTATOR CUFF REPAIR;  Surgeon: Christena Flake, MD;  Location: [facility];  Service: Orthopedics;  Laterality: Right;   SHOULDER ARTHROSCOPY WITH SUBACROMIAL DECOMPRESSION Right 10/06/2016   Procedure: SHOULDER ARTHROSCOPY WITH SUBACROMIAL DECOMPRESSION AND DEBRIDEMENT;  Surgeon: Christena Flake, MD;  Location: [facility];  Service: Orthopedics;  Laterality: Right;   SUBMUCOSAL TATTOO INJECTION  12/20/2023   Procedure: SUBMUCOSAL TATTOO INJECTION;  Surgeon: Toledo, Boykin Nearing, MD;  Location: [facility] ENDOSCOPY;  Service: Gastroenterology;;   TONSILLECTOMY      HEMATOLOGY/ONCOLOGY HISTORY:  Oncology History   No history exists.    ALLERGIES:  has no known allergies.  MEDICATIONS:  Current Facility-Administered Medications  Medication Dose Route Frequency Provider Last Rate Last Admin   acetaminophen (TYLENOL) tablet 650 mg  650 mg Oral Q6H PRN Lynden Oxford R, PA-C       enoxaparin (LOVENOX) injection 40 mg  40 mg Subcutaneous Q24H Lurene Shadow, MD   40 mg at 01/02/24 2108   feeding supplement (BOOST / RESOURCE BREEZE) liquid 1 Container  1 Container Oral TID BM Donovan Kail, PA-C       hydrALAZINE (APRESOLINE) injection 5 mg  5 mg Intravenous Q6H PRN Kandis Cocking, MD       insulin aspart (novoLOG) injection 0-9 Units  0-9 Units Subcutaneous TID WC Marrion Coy, MD       lactated ringers infusion   Intravenous Continuous Lurene Shadow, MD 75 mL/hr at 01/03/24 0454 New Bag at 01/03/24 0454    methocarbamol (ROBAXIN) tablet 500 mg  500 mg Oral Q8H PRN Mila Merry A, RPH   500 mg at 01/03/24 0845   morphine (PF) 2 MG/ML injection 2 mg  2 mg Intravenous Q2H PRN Kandis Cocking, MD   2 mg at 01/03/24 0707   ondansetron [facility]) injection 4 mg  4 mg Intravenous Q4H PRN Kandis Cocking, MD       oxyCODONE (Oxy IR/ROXICODONE) immediate release tablet 5 mg  5 mg Oral Q4H PRN Donovan Kail, PA-C   5 mg at 01/03/24 1052   pantoprazole (PROTONIX) EC tablet 40 mg  40 mg Oral BID Mila Merry A, RPH       rivastigmine (EXELON) capsule 3 mg  3 mg Oral BID Kandis Cocking, MD   3 mg at 01/03/24 1044    VITAL SIGNS: BP (!) 148/80 (BP Location: Right Arm)   Pulse 94   Temp 98.5 F (36.9 C) (Oral)   Resp 18   Ht 6\' 1"  (1.854 m)   Wt 152 lb 8.9 oz (69.2 kg)   SpO2 95%   BMI 20.13 kg/m  Filed Weights   12/29/23 1418 12/30/23 0028  Weight: 156 lb 1.4 oz (70.8 kg) 152 lb 8.9 oz (69.2 kg)    Estimated body mass index is 20.13 kg/m as calculated from the following:   Height as of this encounter: 6\' 1"  (1.854 m).   Weight as of this encounter: 152 lb 8.9 oz (69.2 kg).  LABS: CBC:    Component Value Date/Time   WBC 9.5 01/03/2024 0436   HGB 8.9 (L) 01/03/2024 0436   HGB 10.8 (L) 12/27/2023 1515   HGB 12.6 (L) 04/09/2014 2108   HCT 27.2 (L) 01/03/2024 0436   HCT 37.7 (L) 04/09/2014 2108   PLT 284 01/03/2024 0436   PLT 271 12/27/2023 1515   PLT 193 04/09/2014 2108   MCV 84.7 01/03/2024 0436   MCV 91 04/09/2014 2108   NEUTROABS 1.8 12/31/2023 0732   NEUTROABS 8.1 (H) 04/09/2014 2108   LYMPHSABS 0.8 12/31/2023 0732   LYMPHSABS 0.9 (L) 04/09/2014 2108   MONOABS 0.5 12/31/2023 0732   MONOABS 0.6 04/09/2014 2108   EOSABS 0.0 12/31/2023 0732   EOSABS 0.1 04/09/2014 2108   BASOSABS 0.0 12/31/2023 0732   BASOSABS 0.0 04/09/2014 2108   Comprehensive Metabolic Panel:    Component Value Date/Time   NA 136 01/03/2024 0436   NA 136 04/09/2014 2108   K 4.1 01/03/2024 0436   K  3.9 04/09/2014 2108   CL 103 01/03/2024 0436   CL 103 04/09/2014 2108   CO2 27 01/03/2024 0436   CO2 26 04/09/2014 2108   BUN 14 01/03/2024 0436   BUN 28 (H) 04/09/2014 2108   CREATININE 0.66 01/03/2024 0436   CREATININE 0.79 12/27/2023 1515   CREATININE 1.46 (H) 04/09/2014 2108   GLUCOSE 126 (H) 01/03/2024 0436   GLUCOSE 185 (H) 04/09/2014 2108   CALCIUM 7.9 (L) 01/03/2024 0436   CALCIUM 8.9 04/09/2014 2108   AST 17 12/29/2023 1420   AST 19 12/27/2023 1515   ALT 15 12/29/2023 1420   ALT 17 12/27/2023 1515   ALT 21 04/08/2014 1259   ALKPHOS 91 12/29/2023 1420   ALKPHOS 80 04/08/2014 1259   BILITOT 1.1 12/29/2023 1420   BILITOT 1.0 12/27/2023 1515   PROT 6.7 12/29/2023 1420   PROT 8.1 04/08/2014 1259   ALBUMIN 2.3 (L) 01/01/2024 0440   ALBUMIN 4.1 04/08/2014 1259    RADIOGRAPHIC STUDIES: DG Abd Portable 1V Result Date: 01/01/2024 CLINICAL DATA:  Nasogastric tube placement. Radiologic records indicates laparotomy yesterday. EXAM: PORTABLE ABDOMEN - 1 VIEW COMPARISON:  Radiograph 12/30/2023 FINDINGS: Tip and side port of the enteric tube below the diaphragm in the stomach. No bowel dilatation in the upper abdomen. Midline abdominal staples seen. Free air under the right hemidiaphragm is not unexpected given recent abdominal surgery. IMPRESSION: 1. Tip and side port of the enteric tube below the diaphragm in the stomach. 2. Free air under the right hemidiaphragm is not unexpected given recent abdominal surgery. Electronically Signed   By: Narda Rutherford M.D.   On: 01/01/2024 21:39   DG Abd 1 View Result Date: 12/30/2023 CLINICAL DATA:  Nasogastric tube placement. EXAM: ABDOMEN - 1 VIEW COMPARISON:  Earlier today FINDINGS: Tip and side port of the enteric tube below the diaphragm in the stomach. Gaseous small bowel distension centrally. IMPRESSION: Tip and side port of the enteric tube below the diaphragm in the stomach. Electronically Signed   By: Narda Rutherford M.D.   On: 12/30/2023  18:02   DG Abd 1 View Result Date: 12/30/2023  CLINICAL DATA:  Abdominal distention, watery stools. EXAM: ABDOMEN - 1 VIEW COMPARISON:  CT abdomen pelvis dated 12/29/2023 FINDINGS: Multiple dilated loops of small bowel are seen in the midabdomen, similar to prior exam accounting for differences in technique. Gas overlies the rectum. Degenerative changes are seen in the spine. IMPRESSION: Multiple dilated loops of small bowel in the midabdomen, similar to prior exam accounting for differences in technique. These findings likely reflect bowel obstruction. Electronically Signed   By: Romona Curls M.D.   On: 12/30/2023 16:59   CT ABDOMEN PELVIS W CONTRAST Result Date: 12/29/2023 CLINICAL DATA:  Nausea, vomiting, generalized abdominal pain for 1-2 days. Recent diagnosis of colon cancer. Rule out obstruction. EXAM: CT ABDOMEN AND PELVIS WITH CONTRAST TECHNIQUE: Multidetector CT imaging of the abdomen and pelvis was performed using the standard protocol following bolus administration of intravenous contrast. RADIATION DOSE REDUCTION: This exam was performed according to the departmental dose-optimization program which includes automated exposure control, adjustment of the mA and/or kV according to patient size and/or use of iterative reconstruction technique. CONTRAST:  OMNIPAQUE IOHEXOL 300 MG/ML  SOLN COMPARISON:  CT abdomen and pelvis 10/14/2023 FINDINGS: Lower chest: No acute abnormality. Hepatobiliary: No focal hepatic lesion. Unremarkable gallbladder and biliary tree. Pancreas: Unremarkable. Spleen: Unremarkable. Adrenals/Urinary Tract: Stable adrenal glands. No urinary calculi or hydronephrosis. Unremarkable bladder. Stomach/Bowel: Stomach is within normal limits. Fluid-filled mid and distal small bowel at the upper limits of normal in caliber and increased compared to 10/14/2023. The cecum and ascending colon are fluid-filled with abrupt transition point in the proximal transverse colon at the site of  the mass seen on colonoscopy 12/20/2023. There is increased wall thickening of the cecum and ascending colon compared to 10/14/2023. There are few additional segments of wall thickening in the mid and distal small bowel. Diverticulosis in the descending colon. Trace adjacent stranding and fluid. Vascular/Lymphatic: Aortic atherosclerosis. No enlarged abdominal or pelvic lymph nodes. Reproductive: No acute abnormality. Other: No free intraperitoneal air. Musculoskeletal: No acute fracture.  No destructive osseous lesion. IMPRESSION: 1. Worsening obstruction in the proximal transverse colon secondary to the known adenocarcinoma compared with CT 10/14/2023. 2. Increased wall thickening of the cecum and ascending colon compared to 10/14/2023. There are few additional segments of wall thickening in the mid and distal small bowel. Findings are favored to represent nonspecific enterocolitis. 3. Question mild diverticulitis in the descending colon. 4. Aortic Atherosclerosis (ICD10-I70.0). Electronically Signed   By: Minerva Fester M.D.   On: 12/29/2023 19:18    PERFORMANCE STATUS (ECOG) : 1 - Symptomatic but completely ambulatory  Review of Systems Unless otherwise noted, a complete review of systems is negative.  Physical Exam General: NAD Pulmonary: Unlabored Abdomen: soft, nontender Skin: no rashes Neurological: Weakness but otherwise nonfocal  IMPRESSION: Patient with recently diagnosed transverse colon mass.  He presented to the cancer center for Venofer infusion and was found to have acute and intractable nausea and vomiting and was subsequently hospitalized with obstruction.  Patient now status post right hemicolectomy and anastomosis.  Patient seems to be doing well postoperatively.  He has flatus and had a bowel movement yesterday.  Has some pain this morning but that is reasonably well-controlled with oxycodone and as needed morphine.  Patient pending outpatient PET but likely will not require  adjuvant chemotherapy.  Discussed with Dr. Donneta Romberg who will follow-up with patient in clinic.  Prior to this hospitalization, patient was living at home with his wife and son.  He was independent with his own care  and still drove and worked around the yard.  Family would like to consider rehab in an attempt to return patient to his prior performance status.  Family is interested in establishing advanced directives.  Will consult the chaplain to assist.    PLAN: -Continue current scope of treatment -Outpatient follow-up -Chaplain to assist with establishing ACP documents  Case and plan discussed with Dr. Donneta Romberg   Time Total: 30 minutes  Visit consisted of counseling and education dealing with the complex and emotionally intense issues of symptom management and palliative care in the setting of serious and potentially life-threatening illness.Greater than 50%  of this time was spent counseling and coordinating care related to the above assessment and plan.  Signed by: Laurette Schimke, PhD, NP-C

## 2024-01-03 NOTE — Plan of Care (Signed)

## 2024-01-03 NOTE — TOC Progression Note (Signed)
 Transition of Care (TOC) - Progression Note    Patient Details  Name: Alex TAVES Sr. MRN: [id number] Date of Birth: [birth year]  [facility] CM/SW Contact  Chapman Fitch, RN Phone Number: 01/03/2024, 3:24 PM  Clinical Narrative:     No bed offers. Search extended       Expected Discharge Plan and Services                                              Social Determinants of Health (SDOH) Interventions SDOH Screenings   Food Insecurity: No Food Insecurity (12/29/2023)  Housing: Low Risk  (12/29/2023)  Transportation Needs: No Transportation Needs (12/29/2023)  Utilities: Not At Risk (12/29/2023)  Depression (PHQ2-9): Low Risk  (12/27/2023)  Financial Resource Strain: Low Risk  (07/18/2023)   Received from [facility]  Social Connections: Moderately Integrated (12/30/2023)  Tobacco Use: Medium Risk (12/30/2023)    Readmission Risk Interventions     No data to display

## 2024-01-03 NOTE — TOC Progression Note (Signed)
 Transition of Care (TOC) - Progression Note    Patient Details  Name: Alex PETTIS Sr. MRN: [id number] Date of Birth: 09/09/1946  [facility] CM/SW Contact  Chapman Fitch, RN Phone Number: 01/03/2024, 12:18 PM  Clinical Narrative:        Met with patient and son Timmy at bedside Patient is in agreeable to SNF.  Timmy states their first preference is Programme [employer], broadcasting[employer] and Services                                               Social Determinants of Health (SDOH) Interventions SDOH Screenings   Food Insecurity: No Food Insecurity (12/29/2023)  Housing: Low Risk  (12/29/2023)  Transportation Needs: No Transportation Needs (12/29/2023)  Utilities: Not At Risk (12/29/2023)  Depression (PHQ2-9): Low Risk  (12/27/2023)  Financial Resource Strain: Low Risk  (07/18/2023)   Received from [facility]  Social Connections: Moderately Integrated (12/30/2023)  Tobacco Use: Medium Risk (12/30/2023)    Readmission Risk Interventions     No data to display

## 2024-01-04 ENCOUNTER — Inpatient Hospital Stay
Admit: 2024-01-04 | Discharge: 2024-01-04 | Disposition: A | Discharge status: Home / Self Care | Attending: Student | Admitting: Student

## 2024-01-04 ENCOUNTER — Inpatient Hospital Stay: Admitting: Anesthesiology

## 2024-01-04 ENCOUNTER — Inpatient Hospital Stay

## 2024-01-04 ENCOUNTER — Encounter: Admission: EM | Disposition: E | Payer: Self-pay | Source: Ambulatory Visit | Attending: Internal Medicine

## 2024-01-04 ENCOUNTER — Other Ambulatory Visit: Payer: Self-pay

## 2024-01-04 DIAGNOSIS — I9589 Other hypotension: Secondary | ICD-10-CM

## 2024-01-04 DIAGNOSIS — R6521 Severe sepsis with septic shock: Secondary | ICD-10-CM

## 2024-01-04 DIAGNOSIS — C189 Malignant neoplasm of colon, unspecified: Secondary | ICD-10-CM

## 2024-01-04 DIAGNOSIS — A419 Sepsis, unspecified organism: Secondary | ICD-10-CM

## 2024-01-04 DIAGNOSIS — N182 Chronic kidney disease, stage 2 (mild): Secondary | ICD-10-CM

## 2024-01-04 DIAGNOSIS — R7989 Other specified abnormal findings of blood chemistry: Secondary | ICD-10-CM

## 2024-01-04 DIAGNOSIS — K56609 Unspecified intestinal obstruction, unspecified as to partial versus complete obstruction: Secondary | ICD-10-CM | POA: Diagnosis not present

## 2024-01-04 DIAGNOSIS — R579 Shock, unspecified: Secondary | ICD-10-CM | POA: Diagnosis not present

## 2024-01-04 DIAGNOSIS — G928 Other toxic encephalopathy: Secondary | ICD-10-CM | POA: Diagnosis not present

## 2024-01-04 DIAGNOSIS — F03918 Unspecified dementia, unspecified severity, with other behavioral disturbance: Secondary | ICD-10-CM

## 2024-01-04 DIAGNOSIS — K9189 Other postprocedural complications and disorders of digestive system: Secondary | ICD-10-CM

## 2024-01-04 DIAGNOSIS — I480 Paroxysmal atrial fibrillation: Secondary | ICD-10-CM | POA: Insufficient documentation

## 2024-01-04 DIAGNOSIS — R578 Other shock: Secondary | ICD-10-CM

## 2024-01-04 DIAGNOSIS — E871 Hypo-osmolality and hyponatremia: Secondary | ICD-10-CM

## 2024-01-04 DIAGNOSIS — D62 Acute posthemorrhagic anemia: Secondary | ICD-10-CM

## 2024-01-04 DIAGNOSIS — I959 Hypotension, unspecified: Secondary | ICD-10-CM | POA: Insufficient documentation

## 2024-01-04 DIAGNOSIS — E43 Unspecified severe protein-calorie malnutrition: Secondary | ICD-10-CM | POA: Insufficient documentation

## 2024-01-04 HISTORY — PX: BOWEL RESECTION: SHX1257

## 2024-01-04 HISTORY — PX: LAPAROTOMY: SHX154

## 2024-01-04 HISTORY — PX: COLOSTOMY: SHX63

## 2024-01-04 LAB — CBC WITH DIFFERENTIAL/PLATELET
Abs Immature Granulocytes: 0.42 10*3/uL — ABNORMAL HIGH (ref 0.00–0.07)
Basophils Absolute: 0.2 10*3/uL — ABNORMAL HIGH (ref 0.0–0.1)
Basophils Relative: 1 %
Eosinophils Absolute: 0 10*3/uL (ref 0.0–0.5)
Eosinophils Relative: 0 %
HCT: 30.3 % — ABNORMAL LOW (ref 39.0–52.0)
Hemoglobin: 10 g/dL — ABNORMAL LOW (ref 13.0–17.0)
Immature Granulocytes: 2 %
Lymphocytes Relative: 2 %
Lymphs Abs: 0.4 10*3/uL — ABNORMAL LOW (ref 0.7–4.0)
MCH: 28 pg (ref 26.0–34.0)
MCHC: 33 g/dL (ref 30.0–36.0)
MCV: 84.9 fL (ref 80.0–100.0)
Monocytes Absolute: 0.3 10*3/uL (ref 0.1–1.0)
Monocytes Relative: 1 %
Neutro Abs: 20.1 10*3/uL — ABNORMAL HIGH (ref 1.7–7.7)
Neutrophils Relative %: 94 %
Platelets: 344 10*3/uL (ref 150–400)
RBC: 3.57 MIL/uL — ABNORMAL LOW (ref 4.22–5.81)
RDW: 15.9 % — ABNORMAL HIGH (ref 11.5–15.5)
Smear Review: NORMAL
WBC Morphology: INCREASED
WBC: 21.5 10*3/uL — ABNORMAL HIGH (ref 4.0–10.5)
nRBC: 0.1 % (ref 0.0–0.2)

## 2024-01-04 LAB — CBC
HCT: 31.3 % — ABNORMAL LOW (ref 39.0–52.0)
Hemoglobin: 10.5 g/dL — ABNORMAL LOW (ref 13.0–17.0)
MCH: 27.9 pg (ref 26.0–34.0)
MCHC: 33.5 g/dL (ref 30.0–36.0)
MCV: 83.2 fL (ref 80.0–100.0)
Platelets: 374 10*3/uL (ref 150–400)
RBC: 3.76 MIL/uL — ABNORMAL LOW (ref 4.22–5.81)
RDW: 15.8 % — ABNORMAL HIGH (ref 11.5–15.5)
WBC: 4.2 10*3/uL (ref 4.0–10.5)
nRBC: 0.5 % — ABNORMAL HIGH (ref 0.0–0.2)

## 2024-01-04 LAB — BLOOD GAS, ARTERIAL
Acid-base deficit: 8.3 mmol/L — ABNORMAL HIGH (ref 0.0–2.0)
Acid-base deficit: 1.9 mmol/L (ref 0.0–2.0)
Bicarbonate: 20.2 mmol/L (ref 20.0–28.0)
Bicarbonate: 23.7 mmol/L (ref 20.0–28.0)
FIO2: 40 %
MECHVT: 500 mL
Mechanical Rate: 24
O2 Content: 60 L/min
O2 Saturation: 99.2 %
O2 Saturation: 97.8 %
PEEP: 5 cmH2O
Patient temperature: 37
Patient temperature: 37
RATE: 24 {breaths}/min
Spontaneous VT: 500 mL
pCO2 arterial: 54 mmHg — ABNORMAL HIGH (ref 32–48)
pCO2 arterial: 43 mmHg (ref 32–48)
pH, Arterial: 7.18 — CL (ref 7.35–7.45)
pH, Arterial: 7.35 (ref 7.35–7.45)
pO2, Arterial: 200 mmHg — ABNORMAL HIGH (ref 83–108)
pO2, Arterial: 89 mmHg (ref 83–108)

## 2024-01-04 LAB — BASIC METABOLIC PANEL
Anion gap: 12 (ref 5–15)
Anion gap: 10 (ref 5–15)
BUN: 25 mg/dL — ABNORMAL HIGH (ref 8–23)
BUN: 38 mg/dL — ABNORMAL HIGH (ref 8–23)
CO2: 24 mmol/L (ref 22–32)
CO2: 22 mmol/L (ref 22–32)
Calcium: 8.2 mg/dL — ABNORMAL LOW (ref 8.9–10.3)
Calcium: 7.1 mg/dL — ABNORMAL LOW (ref 8.9–10.3)
Chloride: 98 mmol/L (ref 98–111)
Chloride: 100 mmol/L (ref 98–111)
Creatinine, Ser: 0.89 mg/dL (ref 0.61–1.24)
Creatinine, Ser: 1.61 mg/dL — ABNORMAL HIGH (ref 0.61–1.24)
GFR, Estimated: >60 mL/min (ref >60)
GFR, Estimated: 44 mL/min — ABNORMAL LOW (ref >60)
Glucose, Bld: 169 mg/dL — ABNORMAL HIGH (ref 70–99)
Glucose, Bld: 189 mg/dL — ABNORMAL HIGH (ref 70–99)
Potassium: 4.5 mmol/L (ref 3.5–5.1)
Potassium: 4.7 mmol/L (ref 3.5–5.1)
Sodium: 134 mmol/L — ABNORMAL LOW (ref 135–145)
Sodium: 132 mmol/L — ABNORMAL LOW (ref 135–145)

## 2024-01-04 LAB — TROPONIN I (HIGH SENSITIVITY)
Troponin I (High Sensitivity): 27 ng/L — ABNORMAL HIGH (ref <18)
Troponin I (High Sensitivity): 41 ng/L — ABNORMAL HIGH (ref <18)

## 2024-01-04 LAB — PROCALCITONIN: Procalcitonin: 16.76 ng/mL

## 2024-01-04 LAB — COMPREHENSIVE METABOLIC PANEL
ALT: 45 U/L — ABNORMAL HIGH (ref 0–44)
AST: 104 U/L — ABNORMAL HIGH (ref 15–41)
Albumin: 1.8 g/dL — ABNORMAL LOW (ref 3.5–5.0)
Alkaline Phosphatase: 58 U/L (ref 38–126)
Anion gap: 11 (ref 5–15)
BUN: 36 mg/dL — ABNORMAL HIGH (ref 8–23)
CO2: 22 mmol/L (ref 22–32)
Calcium: 7.1 mg/dL — ABNORMAL LOW (ref 8.9–10.3)
Chloride: 100 mmol/L (ref 98–111)
Creatinine, Ser: 1.59 mg/dL — ABNORMAL HIGH (ref 0.61–1.24)
GFR, Estimated: 44 mL/min — ABNORMAL LOW (ref >60)
Glucose, Bld: 216 mg/dL — ABNORMAL HIGH (ref 70–99)
Potassium: 5.2 mmol/L — ABNORMAL HIGH (ref 3.5–5.1)
Sodium: 133 mmol/L — ABNORMAL LOW (ref 135–145)
Total Bilirubin: 0.6 mg/dL (ref 0.0–1.2)
Total Protein: 4.1 g/dL — ABNORMAL LOW (ref 6.5–8.1)

## 2024-01-04 LAB — PROTIME-INR
INR: 1.5 — ABNORMAL HIGH (ref 0.8–1.2)
Prothrombin Time: 18.1 s — ABNORMAL HIGH (ref 11.4–15.2)

## 2024-01-04 LAB — LACTIC ACID, PLASMA
Lactic Acid, Venous: 3.5 mmol/L (ref 0.5–1.9)
Lactic Acid, Venous: 3.3 mmol/L (ref 0.5–1.9)
Lactic Acid, Venous: 4.7 mmol/L (ref 0.5–1.9)
Lactic Acid, Venous: 3 mmol/L (ref 0.5–1.9)

## 2024-01-04 LAB — GLUCOSE, CAPILLARY
Glucose-Capillary: 146 mg/dL — ABNORMAL HIGH (ref 70–99)
Glucose-Capillary: 148 mg/dL — ABNORMAL HIGH (ref 70–99)
Glucose-Capillary: 173 mg/dL — ABNORMAL HIGH (ref 70–99)
Glucose-Capillary: 144 mg/dL — ABNORMAL HIGH (ref 70–99)
Glucose-Capillary: 174 mg/dL — ABNORMAL HIGH (ref 70–99)

## 2024-01-04 LAB — PHOSPHORUS: Phosphorus: 4.1 mg/dL (ref 2.5–4.6)

## 2024-01-04 LAB — TYPE AND SCREEN
ABO/RH(D): O POS
Antibody Screen: NEGATIVE

## 2024-01-04 LAB — MAGNESIUM: Magnesium: 2 mg/dL (ref 1.7–2.4)

## 2024-01-04 LAB — MRSA NEXT GEN BY PCR, NASAL: MRSA by PCR Next Gen: NOT DETECTED

## 2024-01-04 LAB — HEPARIN LEVEL (UNFRACTIONATED): Heparin Unfractionated: <0.1 [IU]/mL — ABNORMAL LOW (ref 0.30–0.70)

## 2024-01-04 SURGERY — LAPAROTOMY, EXPLORATORY
Anesthesia: General

## 2024-01-04 MED ORDER — NOREPINEPHRINE 4 MG/250ML-% IV SOLN
0.0000 ug/min | INTRAVENOUS | Status: DC
Start: 2024-01-04 — End: 2024-01-04
  Administered 2024-01-04: 4 ug/min via INTRAVENOUS

## 2024-01-04 MED ORDER — LACTATED RINGERS IV SOLN: INTRAVENOUS | Status: DC

## 2024-01-04 MED ORDER — HYDROMORPHONE HCL 1 MG/ML IJ SOLN
0.5000 mg | INTRAMUSCULAR | Status: DC | PRN
  Administered 2024-01-04: 0.5 mg via INTRAVENOUS
  Filled 2024-01-04: qty 1

## 2024-01-04 MED ORDER — PANTOPRAZOLE SODIUM 40 MG IV SOLR
40.0000 mg | INTRAVENOUS | Status: DC
Start: 2024-01-04 — End: 2024-01-06
  Administered 2024-01-04 – 2024-01-05 (×2): 40 mg via INTRAVENOUS
  Filled 2024-01-04 (×2): qty 10

## 2024-01-04 MED ORDER — NOREPINEPHRINE 16 MG/250ML-% IV SOLN
0.0000 ug/min | INTRAVENOUS | Status: DC
  Administered 2024-01-04: 2 ug/min via INTRAVENOUS
  Filled 2024-01-04: qty 250

## 2024-01-04 MED ORDER — NOREPINEPHRINE 4 MG/250ML-% IV SOLN
INTRAVENOUS | Status: AC
  Filled 2024-01-04: qty 250

## 2024-01-04 MED ORDER — SUCCINYLCHOLINE CHLORIDE 200 MG/10ML IV SOSY
PREFILLED_SYRINGE | INTRAVENOUS | Status: AC
  Filled 2024-01-04: qty 10

## 2024-01-04 MED ORDER — BUPIVACAINE-EPINEPHRINE (PF) 0.25% -1:200000 IJ SOLN
INTRAMUSCULAR | Status: AC
  Filled 2024-01-04: qty 30

## 2024-01-04 MED ORDER — AMIODARONE LOAD VIA INFUSION
150.0000 mg | Freq: Once | INTRAVENOUS | Status: AC
  Administered 2024-01-04: 150 mg via INTRAVENOUS
  Filled 2024-01-04: qty 83.34

## 2024-01-04 MED ORDER — ENOXAPARIN SODIUM 40 MG/0.4ML IJ SOSY
40.0000 mg | PREFILLED_SYRINGE | INTRAMUSCULAR | Status: DC
  Administered 2024-01-05 – 2024-01-08 (×4): 40 mg via SUBCUTANEOUS
  Filled 2024-01-04 (×4): qty 0.4

## 2024-01-04 MED ORDER — 0.9 % SODIUM CHLORIDE (POUR BTL) OPTIME
TOPICAL | Status: DC | PRN
  Administered 2024-01-04: 7000 mL

## 2024-01-04 MED ORDER — ONDANSETRON HCL 4 MG/2ML IJ SOLN
INTRAMUSCULAR | Status: DC | PRN
  Administered 2024-01-04: 4 mg via INTRAVENOUS

## 2024-01-04 MED ORDER — HEPARIN BOLUS VIA INFUSION
4000.0000 [IU] | Freq: Once | INTRAVENOUS | Status: AC
  Administered 2024-01-04: 4000 [IU] via INTRAVENOUS
  Filled 2024-01-04: qty 4000

## 2024-01-04 MED ORDER — FLUCONAZOLE IN SODIUM CHLORIDE 400-0.9 MG/200ML-% IV SOLN
800.0000 mg | Freq: Once | INTRAVENOUS | Status: AC
  Administered 2024-01-04: 800 mg via INTRAVENOUS
  Filled 2024-01-04: qty 400
  Filled 2024-01-04: qty 200

## 2024-01-04 MED ORDER — AMIODARONE HCL IN DEXTROSE 360-4.14 MG/200ML-% IV SOLN
30.0000 mg/h | INTRAVENOUS | Status: DC
Start: 2024-01-04 — End: 2024-01-08
  Administered 2024-01-04 – 2024-01-08 (×8): 30 mg/h via INTRAVENOUS
  Filled 2024-01-04 (×9): qty 200

## 2024-01-04 MED ORDER — IOHEXOL 300 MG/ML  SOLN: 30.0000 mL | Freq: Once | INTRAMUSCULAR | Status: DC | PRN

## 2024-01-04 MED ORDER — IOHEXOL 300 MG/ML  SOLN
100.0000 mL | Freq: Once | INTRAMUSCULAR | Status: AC | PRN
  Administered 2024-01-04: 100 mL via INTRAVENOUS

## 2024-01-04 MED ORDER — FENTANYL CITRATE (PF) 100 MCG/2ML IJ SOLN
INTRAMUSCULAR | Status: DC | PRN
  Administered 2024-01-04 (×2): 50 ug via INTRAVENOUS

## 2024-01-04 MED ORDER — BUPIVACAINE-EPINEPHRINE 0.25% -1:200000 IJ SOLN
INTRAMUSCULAR | Status: DC | PRN
  Administered 2024-01-04: 40 mL

## 2024-01-04 MED ORDER — LACTATED RINGERS IV BOLUS
1000.0000 mL | Freq: Once | INTRAVENOUS | Status: AC
  Administered 2024-01-04: 1000 mL via INTRAVENOUS

## 2024-01-04 MED ORDER — FENTANYL CITRATE (PF) 100 MCG/2ML IJ SOLN
INTRAMUSCULAR | Status: AC
  Filled 2024-01-04: qty 2

## 2024-01-04 MED ORDER — POLYETHYLENE GLYCOL 3350 17 G PO PACK
17.0000 g | PACK | Freq: Every day | ORAL | Status: DC
  Filled 2024-01-04: qty 1

## 2024-01-04 MED ORDER — BUPIVACAINE LIPOSOME 1.3 % IJ SUSP
INTRAMUSCULAR | Status: AC
  Filled 2024-01-04: qty 20

## 2024-01-04 MED ORDER — AMIODARONE HCL IN DEXTROSE 360-4.14 MG/200ML-% IV SOLN
60.0000 mg/h | INTRAVENOUS | Status: AC
  Administered 2024-01-04 (×2): 60 mg/h via INTRAVENOUS
  Filled 2024-01-04 (×2): qty 200

## 2024-01-04 MED ORDER — PHENYLEPHRINE 80 MCG/ML (10ML) SYRINGE FOR IV PUSH (FOR BLOOD PRESSURE SUPPORT)
PREFILLED_SYRINGE | INTRAVENOUS | Status: DC | PRN
  Administered 2024-01-04: 80 ug via INTRAVENOUS

## 2024-01-04 MED ORDER — VASOPRESSIN 20 UNITS/100 ML INFUSION FOR SHOCK
0.0000 [IU]/min | INTRAVENOUS | Status: DC
  Administered 2024-01-04 – 2024-01-05 (×2): 0.04 [IU]/min via INTRAVENOUS
  Filled 2024-01-04 (×2): qty 100

## 2024-01-04 MED ORDER — HEPARIN (PORCINE) 25000 UT/250ML-% IV SOLN
1100.0000 [IU]/h | INTRAVENOUS | Status: DC
  Administered 2024-01-04: 1100 [IU]/h via INTRAVENOUS
  Filled 2024-01-04: qty 250

## 2024-01-04 MED ORDER — ETOMIDATE 2 MG/ML IV SOLN
INTRAVENOUS | Status: DC | PRN
  Administered 2024-01-04: 14 mg via INTRAVENOUS

## 2024-01-04 MED ORDER — ONDANSETRON HCL 4 MG/2ML IJ SOLN
INTRAMUSCULAR | Status: AC
  Filled 2024-01-04: qty 2

## 2024-01-04 MED ORDER — PROPOFOL 1000 MG/100ML IV EMUL
INTRAVENOUS | Status: AC
Start: 2024-01-04 — End: ?
  Filled 2024-01-04: qty 100

## 2024-01-04 MED ORDER — FLUCONAZOLE IN SODIUM CHLORIDE 400-0.9 MG/200ML-% IV SOLN
400.0000 mg | INTRAVENOUS | Status: DC
  Administered 2024-01-05 – 2024-01-10 (×6): 400 mg via INTRAVENOUS
  Filled 2024-01-04 (×6): qty 200

## 2024-01-04 MED ORDER — FENTANYL CITRATE PF 50 MCG/ML IJ SOSY
25.0000 ug | PREFILLED_SYRINGE | INTRAMUSCULAR | Status: DC | PRN
  Administered 2024-01-05: 25 ug via INTRAVENOUS
  Filled 2024-01-04: qty 1

## 2024-01-04 MED ORDER — DIGOXIN 0.25 MG/ML IJ SOLN
INTRAMUSCULAR | Status: AC
  Administered 2024-01-04: 500 ug
  Filled 2024-01-04: qty 2

## 2024-01-04 MED ORDER — INSULIN ASPART 100 UNIT/ML IJ SOLN
0.0000 [IU] | INTRAMUSCULAR | Status: DC
  Administered 2024-01-04: 2 [IU] via SUBCUTANEOUS
  Administered 2024-01-05: 1 [IU] via SUBCUTANEOUS
  Administered 2024-01-05 (×2): 2 [IU] via SUBCUTANEOUS
  Administered 2024-01-05: 1 [IU] via SUBCUTANEOUS
  Administered 2024-01-06 (×5): 2 [IU] via SUBCUTANEOUS
  Administered 2024-01-06: 1 [IU] via SUBCUTANEOUS
  Administered 2024-01-07 (×7): 3 [IU] via SUBCUTANEOUS
  Administered 2024-01-08: 1 [IU] via SUBCUTANEOUS
  Administered 2024-01-08: 2 [IU] via SUBCUTANEOUS
  Administered 2024-01-08: 3 [IU] via SUBCUTANEOUS
  Administered 2024-01-08: 1 [IU] via SUBCUTANEOUS
  Administered 2024-01-08 (×2): 2 [IU] via SUBCUTANEOUS
  Administered 2024-01-09: 3 [IU] via SUBCUTANEOUS
  Administered 2024-01-09: 2 [IU] via SUBCUTANEOUS
  Administered 2024-01-09: 3 [IU] via SUBCUTANEOUS
  Filled 2024-01-04 (×26): qty 1

## 2024-01-04 MED ORDER — ROCURONIUM BROMIDE 100 MG/10ML IV SOLN
INTRAVENOUS | Status: DC | PRN
  Administered 2024-01-04: 20 mg via INTRAVENOUS
  Administered 2024-01-04: 80 mg via INTRAVENOUS

## 2024-01-04 MED ORDER — DOCUSATE SODIUM 50 MG/5ML PO LIQD
100.0000 mg | Freq: Two times a day (BID) | ORAL | Status: DC
Start: 2024-01-04 — End: 2024-01-04
  Filled 2024-01-04: qty 10

## 2024-01-04 MED ORDER — ACETAMINOPHEN 10 MG/ML IV SOLN
1000.0000 mg | Freq: Four times a day (QID) | INTRAVENOUS | Status: AC
  Administered 2024-01-04 – 2024-01-06 (×7): 1000 mg via INTRAVENOUS
  Filled 2024-01-04 (×7): qty 100

## 2024-01-04 MED ORDER — ALBUMIN HUMAN 5 % IV SOLN: INTRAVENOUS | Status: DC | PRN

## 2024-01-04 MED ORDER — FENTANYL CITRATE PF 50 MCG/ML IJ SOSY
25.0000 ug | PREFILLED_SYRINGE | INTRAMUSCULAR | Status: AC | PRN
  Administered 2024-01-05 (×3): 25 ug via INTRAVENOUS
  Filled 2024-01-04 (×3): qty 1

## 2024-01-04 MED ORDER — DIGOXIN 0.25 MG/ML IJ SOLN
0.2500 mg | Freq: Once | INTRAMUSCULAR | Status: AC
  Administered 2024-01-04: 0.25 mg via INTRAVENOUS
  Filled 2024-01-04: qty 2

## 2024-01-04 MED ORDER — PROPOFOL 10 MG/ML IV BOLUS
INTRAVENOUS | Status: DC | PRN
  Administered 2024-01-04: 25 ug/kg/min via INTRAVENOUS

## 2024-01-04 MED ORDER — DIGOXIN 0.25 MG/ML IJ SOLN
INTRAMUSCULAR | Status: AC
  Filled 2024-01-04: qty 2

## 2024-01-04 MED ORDER — PHENYLEPHRINE HCL-NACL 20-0.9 MG/250ML-% IV SOLN
INTRAVENOUS | Status: AC
Start: 2024-01-04 — End: ?
  Filled 2024-01-04: qty 250

## 2024-01-04 MED ORDER — ALBUMIN HUMAN 5 % IV SOLN
INTRAVENOUS | Status: AC
  Filled 2024-01-04: qty 250

## 2024-01-04 MED ORDER — PIPERACILLIN-TAZOBACTAM 3.375 G IVPB
3.3750 g | Freq: Three times a day (TID) | INTRAVENOUS | Status: DC
  Administered 2024-01-04 – 2024-01-10 (×19): 3.375 g via INTRAVENOUS
  Filled 2024-01-04 (×19): qty 50

## 2024-01-04 MED ORDER — ETOMIDATE 2 MG/ML IV SOLN
INTRAVENOUS | Status: AC
  Filled 2024-01-04: qty 10

## 2024-01-04 SURGICAL SUPPLY — 41 items
BNDG GAUZE DERMACEA FLUFF 4 (GAUZE/BANDAGES/DRESSINGS) IMPLANT
DRAIN CHANNEL JP 15F RND 3/16 (MISCELLANEOUS) IMPLANT
DRAPE LAPAROTOMY 100X77 ABD (DRAPES) ×1 IMPLANT
DRSG OPSITE POSTOP 4X10 (GAUZE/BANDAGES/DRESSINGS) IMPLANT
DRSG OPSITE POSTOP 4X8 (GAUZE/BANDAGES/DRESSINGS) IMPLANT
ELECT BLADE 6.5 EXT (BLADE) ×1 IMPLANT
ELECT REM PT RETURN 9FT ADLT (ELECTROSURGICAL) ×1 IMPLANT
ELECTRODE REM PT RTRN 9FT ADLT (ELECTROSURGICAL) ×1 IMPLANT
EVACUATOR SILICONE 100CC (DRAIN) IMPLANT
GAUZE SPONGE 4X4 12PLY STRL (GAUZE/BANDAGES/DRESSINGS) IMPLANT
GLOVE BIOGEL PI IND STRL 7.5 (GLOVE) ×1 IMPLANT
GLOVE SURG SYN 7.0 (GLOVE) ×5 IMPLANT
GLOVE SURG SYN 7.0 PF PI (GLOVE) ×1 IMPLANT
GOWN STRL REUS W/ TWL LRG LVL3 (GOWN DISPOSABLE) ×2 IMPLANT
KIT OSTOMY 2 PC DRNBL 2.25 STR (WOUND CARE) IMPLANT
KIT OSTOMY DRAINABLE 3.25 STR (WOUND CARE) IMPLANT
KIT TURNOVER KIT A (KITS) ×1 IMPLANT
LABEL OR SOLS (LABEL) ×1 IMPLANT
LIGASURE IMPACT 36 18CM CVD LR (INSTRUMENTS) IMPLANT
MANIFOLD NEPTUNE II (INSTRUMENTS) ×1 IMPLANT
NDL HYPO 22X1.5 SAFETY MO (MISCELLANEOUS) ×1 IMPLANT
NEEDLE HYPO 22X1.5 SAFETY MO (MISCELLANEOUS) ×1 IMPLANT
NS IRRIG 1000ML POUR BTL (IV SOLUTION) ×1 IMPLANT
PACK BASIN MAJOR ARMC (MISCELLANEOUS) ×1 IMPLANT
PACK COLON CLEAN CLOSURE (MISCELLANEOUS) ×1 IMPLANT
PAD ABD DERMACEA PRESS 5X9 (GAUZE/BANDAGES/DRESSINGS) IMPLANT
RELOAD PROXIMATE 75MM BLUE (ENDOMECHANICALS) ×1 IMPLANT
RELOAD STAPLE 75 3.8 BLU REG (ENDOMECHANICALS) IMPLANT
SPONGE T-LAP 18X18 ~~LOC~~+RFID (SPONGE) IMPLANT
STAPLER PROXIMATE 75MM BLUE (STAPLE) IMPLANT
STAPLER SKIN PROX 35W (STAPLE) ×1 IMPLANT
SUT ETHILON 3-0 FS-10 30 BLK (SUTURE) ×1 IMPLANT
SUT PDS AB 0 CT1 27 (SUTURE) IMPLANT
SUT SILK 3 0 SH CR/8 (SUTURE) IMPLANT
SUT VIC AB 3-0 SH 27X BRD (SUTURE) ×1 IMPLANT
SUT VICRYL 3-0 CR8 SH (SUTURE) IMPLANT
SUTURE EHLN 3-0 FS-10 30 BLK (SUTURE) IMPLANT
SYR 20ML LL LF (SYRINGE) ×2 IMPLANT
TRAP FLUID SMOKE EVACUATOR (MISCELLANEOUS) ×1 IMPLANT
TRAY FOLEY MTR SLVR 16FR STAT (SET/KITS/TRAYS/PACK) ×1 IMPLANT
WATER STERILE IRR 500ML POUR (IV SOLUTION) ×1 IMPLANT

## 2024-01-04 NOTE — Progress Notes (Signed)
 Progress Note RR called overhead Immediately responded to bedside Medicine MD and RN staff at bedside Patient became clammy, tachycardic, and hypotensive EKG obtained and found to be 190 bpm; irregularly irregular  1L LR ordered and running; improvement in hypotension  Abdominal examination is unchanged from this AM; expectedly tender, distended and tympanic.   Plan:  Cardiac monitoring Digoxin given x2 at bedside per medicine IVF resuscitation bolus given Transferred to step down Appreciate medicine efforts  From surgical perspective,  We will make him NPO Will plan to get CT Abdomen/Pelvis once stabilized to ensure no missed intra-abdominal process to explain decompensation  -- Lynden Oxford, PA-C [facility] 01/04/2024, 9:17 AM M-F: 7am - 4pm

## 2024-01-04 NOTE — Op Note (Signed)
 Operative Note  Preoperative diagnosis anastomotic leak Postoperative diagnosis: Anastomotic leak Surgeon: Baker Pierini, MD Assistant: Sterling Big, MD the assistance of Dr. Everlene Farrier was needed as there was no qualified assistant.  His assistance was needed for aid in dissection, retraction and closing of the abdomen EBL: 50 cc  After informed consent was obtained the patient was brought to the operating room and placed supine on the operating room table.  General endotracheal anesthesia was then induced and his abdomen was then prepped and draped in the usual sterile fashion.  A surgical timeout was called identifying correct patient, site, side and procedure.  His midline staples from his previous wound were removed.  The fascial stitches were intact and were cut opening the abdomen.  Upon entry into the abdomen there was murky fluid in the right lower quadrant.  We then surveyed the right upper quadrant.  There was frank stool and bilious fluid in the right upper quadrant.  This was suctioned out.  The small bowel was bluntly dissected from the sidewall and the terminal ileum was followed to our anastomosis.  There were some loose adhesions and fibrinous tissue that was broken up with finger dissection.  The previous anastomosis was completely disrupted with the common channel being perforated.  There was leakage of stool into the abdomen from this.  Given the complete disruption of the anastomosis I elected to perform an end ileostomy with mucous fistula.  The terminal ileum proximal to the leak was identified and a hole was made in the mesentery.  The proximal end was stapled off with a GIA 45 mm blue load stapler.  We then mobilized the transverse colon distally and identified a spot distal to the anastomosis where the colon appeared healthy.  The colonic mesentery was then opened and the colon was then stapled off with a 45 mm blue load GIA stapler.  The mesentery was taken with the LigaSure.  The  anastomosis was then passed off the table as specimen.  We then proceeded to mobilize the transverse colon.  The omentum was attached to the transverse colon and this was taken down with the LigaSure.  We mobilized the gastrocolic ligament towards the splenic flexure.  We then came around the splenic flexure and completely mobilized this toward the descending colon.  Once we mobilized the splenic flexure we did not proceed with further mobilization as we had good length to perform a mucous fistula.  The abdomen was then copiously irrigated with 5 L of warm saline solution.  Next we selected a site for our end ileostomy.  This was in the right lower quadrant through the rectus muscle.  The skin was incised with cautery and the subcutaneous tissue was dissected with Bovie cautery.  The anterior fascia was opened with electrocautery in a cruciate incision.  The muscle was retracted laterally and the peritoneum was opened.  We were able to fit 2 fingers through the ostomy.  In order for better mobilization of the terminal ileum we did take some of the mesentery down towards the mesenteric root to ensure appropriate length.  The terminal ileum was brought through this whole.  Next we selected a site in the left upper quadrant for mucous fistula.  In similar fashion the colostomy was brought through the skin.  This was done after creating a site, incising the electrocautery, dissecting down to the anterior fascia and opening it with a cruciate incision and then retracting the muscle and opening the posterior sheath and the peritoneum.  We ensured that there was no further bleeding or leakage.  A 19 [place] Blake drain was placed in the right upper quadrant and brought out the right upper quadrant through the skin.  This was secured to the skin with 3-0 nylon suture.  The fascia was then closed with a 0 PDS in running from the bottom and the top and tied together in the middle.  The wound was protected and I turned my  attention towards maturing the ostomy.  The staple line was removed and the ostomy was matured in a Brooke fashion using 3-0 Vicryl suture.  Once this was matured we turned our attention to maturing the mucous fistula.  The staple line was also removed and the colostomy was matured with 3-0 Vicryl suture using a standard Brooke fashion.  The wound was then packed with wet-to-dry Kerlix and dressed with ABD pads ostomy appliances were fashioned.  The patient was left intubated and taken to the ICU in critical condition.  Prior to closure all sponge and instrument counts were correct x 2.   the assistance of Dr. Everlene Farrier was needed as there was no Sales promotion account executive.  His assistance was needed for aid in dissection, retraction and closing of the abdomen

## 2024-01-04 NOTE — Consult Note (Signed)
 [facility] CLINIC CARDIOLOGY CONSULT NOTE       Patient ID: Alex MCCADDEN Sr. MRN: [id number] DOB/AGE: [birth year] 78 y.o.  Admit date: 12/29/2023 Referring Physician Dr. Marrion Coy Primary Physician Dareen Piano Marya Amsler, MD  Primary Cardiologist None Reason for Consultation AF RVR  HPI: Alex Barot. is a 78 y.o. male  with a past medical history of hypertension, type II diabetes, lewy body dementia, adenocarcinoma of colon who presented to the ED on 12/29/2023 for abdominal pain. Underwent ex lap with R hemicolectomy 12/31/2023. Earlier this AM he developed AF RVR and hypotension. Cardiology was consulted for further evaluation.   Patient presented to the ED late last week with complaints of abdominal pain, he was recently diagnosed with adenocarcinoma of the colon.  On Sunday he was taken to the OR for exploratory laparotomy and ultimately underwent right hemicolectomy on 3/2.  Had been recovering well throughout the week until early this morning when he developed atrial fibrillation RVR with associated hypotension.  Rapid response was called at that time and he was given IV digoxin associated hypotension.  Rapid response was called at that time and he was given IV digoxin which did not improve his rate.  He was transferred to the ICU for further management.  At the time of my evaluation this morning patient is resting comfortably in hospital bed with no family present at bedside.  He states that he is not feeling great, we discussed his symptoms from earlier this morning.  He reports that he had sudden onset of palpitations with associated shortness of breath.  Denies any chest pain symptoms at that time.  States that he is feeling slightly better now.  Remains in atrial fibrillation with rapid rate.  He was started on IV amiodarone since being transferred to the ICU.  He has no prior history of atrial fibrillation or palpitation episodes.  Does not appear he has ever been seen by cardiology  in the past, follows with Dr. Einar Crow for primary care.  Initial troponin 27.  Lactic acid elevated at 3.5.  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  Only wears upper    Past Surgical History:  Procedure Laterality Date   AMPUTATION TOE Right 03/01/2020   Procedure: AMPUTATION TOE;  Surgeon: [name], DPM;  Location: [facility];  Service: Podiatry;  Laterality: Right;   AMPUTATION TOE Right 01/03/2023   Procedure: AMPUTATION TOE, 2nd right;  Surgeon: [name], DPM;  Location: [facility];  Service: Podiatry;  Laterality: Right;   APPENDECTOMY     BIOPSY  12/20/2023   Procedure: BIOPSY;  Surgeon: Norma Fredrickson, Boykin Nearing, MD;  Location:  [facility];  Service: Gastroenterology;;   BONE EXCISION Right 01/03/2023   Procedure: Exostectomy interphalangeal joint right great toe;  Surgeon: [name], DPM;  Location: [facility];  Service: Podiatry;  Laterality: Right;   CATARACT EXTRACTION W/PHACO Left 09/06/2021   Procedure: CATARACT EXTRACTION PHACO AND INTRAOCULAR LENS PLACEMENT (IOC) LEFT 2.71 00:30.6;  Surgeon: Nevada Crane, MD;  Location: [facility];  Service: Ophthalmology;  Laterality: Left;  Diabetic   COLONOSCOPY WITH PROPOFOL N/A 12/20/2023    Procedure: COLONOSCOPY WITH PROPOFOL;  Surgeon: Toledo, Boykin Nearing, MD;  Location: [facility];  Service: Gastroenterology;  Laterality: N/A;   ESOPHAGOGASTRODUODENOSCOPY (EGD) WITH PROPOFOL N/A 12/20/2023   Procedure: ESOPHAGOGASTRODUODENOSCOPY (EGD) WITH PROPOFOL;  Surgeon: Toledo, Boykin Nearing, MD;  Location: [facility];  Service: Gastroenterology;  Laterality: N/A;   EYE SURGERY     age 24   HERNIA REPAIR     INSERTION OF MESH  06/23/2023   Procedure: INSERTION OF MESH;  Surgeon: Sung Amabile, DO;  Location: [facility];  Service: General;;   KIDNEY STONE SURGERY     LAPAROTOMY Right 12/31/2023   Procedure: EXPLORATORY LAPAROTOMY WITH RIGHT COLECTOMY;  Surgeon: Kandis Cocking, MD;  Location: [facility];  Service: General;  Laterality: Right;   REMOVAL RETAINED LENS Right 04/19/2021   Procedure: CATARACT EXTRACTION PHACO,  RIGHT VISION BLUE 0.20 00:01.4 case aborted;  Surgeon: Nevada Crane, MD;  Location: [facility];  Service: Ophthalmology;  Laterality: Right;  Diabetic - oral meds   SHOULDER ARTHROSCOPY WITH BICEPS TENDON REPAIR Right 10/06/2016   Procedure: SHOULDER ARTHROSCOPY WITH BICEPS TENDON REPAIR;  Surgeon: Christena Flake, MD;  Location: [facility];  Service: Orthopedics;  Laterality: Right;   SHOULDER ARTHROSCOPY WITH OPEN ROTATOR CUFF REPAIR Right 10/06/2016   Procedure: SHOULDER ARTHROSCOPY WITH OPEN ROTATOR CUFF REPAIR;  Surgeon: Christena Flake, MD;  Location: [facility];  Service: Orthopedics;  Laterality: Right;   SHOULDER ARTHROSCOPY WITH SUBACROMIAL DECOMPRESSION Right 10/06/2016   Procedure: SHOULDER ARTHROSCOPY WITH SUBACROMIAL DECOMPRESSION AND DEBRIDEMENT;  Surgeon: Christena Flake, MD;  Location: [facility];  Service: Orthopedics;  Laterality: Right;   SUBMUCOSAL TATTOO INJECTION  12/20/2023   Procedure: SUBMUCOSAL TATTOO INJECTION;  Surgeon: Toledo, Boykin Nearing, MD;  Location: [facility];  Service: Gastroenterology;;   TONSILLECTOMY      Medications Prior to Admission   Medication Sig Dispense Refill Last Dose/Taking   aspirin 81 MG tablet Take 81 mg by mouth daily.   Past Week   atorvastatin (LIPITOR) 80 MG tablet Take 80 mg by mouth daily.   Past Week   Cholecalciferol (VITAMIN D3) 50 MCG (2000 UT) TABS Take 1 tablet by mouth daily.   Past Week   cyanocobalamin (VITAMIN B12) 1000 MCG tablet Take 1,000 mcg by mouth daily.   Past Week   ferrous sulfate 325 (65 FE) MG tablet Take 325 mg by mouth 2 (two) times daily with a meal.   Past Week   glipiZIDE (GLUCOTROL) 5 MG tablet Take 5 mg by mouth daily.   Past Week   losartan-hydrochlorothiazide (HYZAAR) 100-12.5 MG tablet Take 1 tablet by mouth daily.   Past Week   metFORMIN (GLUCOPHAGE) 500 MG tablet Take 500 mg by mouth 2 (two) times daily with a meal.   Past Week   pioglitazone (ACTOS) 15 MG tablet Take 15 mg by mouth daily.   Past Week   rivastigmine (EXELON) 3 MG capsule Take 3 mg by mouth 2 (two) times daily.   Past Week  Social History   Socioeconomic History   Marital status: Married    Spouse name: Burna Mortimer   Number of children: 3   Years of education: Not on file   Highest education level: Not on file  Occupational History   Not on file  Tobacco Use   Smoking status: Former    Current packs/day: 0.00    Average packs/day: 0.3 packs/day for 10.0 years (2.5 ttl pk-yrs)    Types: Cigarettes    Start date: 10/01/1959    Quit date: 09/30/1969    Years since quitting: 54.2    Passive exposure: Past   Smokeless tobacco: Former  Building services engineer status: Never Used  Substance and Sexual Activity   Alcohol use: No   Drug use: No   Sexual activity: Not on file  Other Topics Concern   Not on file  Social History Narrative   Not on file   Social Drivers of Health   Financial Resource Strain: Low Risk  (07/18/2023)   Received from [facility]   Overall Financial Resource Strain (CARDIA)    Difficulty of Paying Living Expenses: Not hard at all  Food Insecurity: No Food  Insecurity (12/29/2023)   Hunger Vital Sign    Worried About Running Out of Food in the Last Year: Never true    Ran Out of Food in the Last Year: Never true  Transportation Needs: No Transportation Needs (12/29/2023)   PRAPARE - Administrator, Civil Service (Medical): No    Lack of Transportation (Non-Medical): No  Physical Activity: Not on file  Stress: Not on file  Social Connections: Moderately Integrated (12/30/2023)   Social Connection and Isolation Panel [NHANES]    Frequency of Communication with Friends and Family: More than three times a week    Frequency of Social Gatherings with Friends and Family: Twice a week    Attends Religious Services: More than 4 times per year    Active Member of [employer] or Organizations: No    Attends Banker Meetings: Never    Marital Status: Married  Catering manager Violence: Not At Risk (12/29/2023)   Humiliation, Afraid, Rape, and Kick questionnaire    Fear of Current or Ex-Partner: No    Emotionally Abused: No    Physically Abused: No    Sexually Abused: No    Family History  Family history unknown: Yes     Vitals:   01/04/24 0846 01/04/24 0852 01/04/24 0856 01/04/24 0904  BP: (!) 70/40 (!) 98/50 (!) 90/40 (!) 80/40  Pulse:      Resp:   (!) 28   Temp:      TempSrc:      SpO2:      Weight:      Height:        PHYSICAL EXAM General: Ill-appearing elderly male, well nourished, in no acute distress. HEENT: Normocephalic and atraumatic. Neck: No JVD.  Lungs: Normal respiratory effort on 2 L nasal cannula. Clear bilaterally to auscultation. No wheezes, crackles, rhonchi.  Heart: Irregularly irregular, elevated rate. Normal S1 and S2 without gallops or murmurs.  Abdomen: Non-distended appearing.  Msk: Normal strength and tone for age. Extremities: Warm and well perfused. No clubbing, cyanosis.  No significant edema.  Neuro: Alert and oriented X 3. Psych: Answers questions appropriately.   Labs: Basic  Metabolic Panel: Recent Labs    01/02/24 0422 01/03/24 0436 01/04/24 0430  NA 140 136 134*  K 4.2 4.1 4.5  CL 106 103 98  CO2 25 27 24   GLUCOSE 119* 126* 169*  BUN 23 14 25*  CREATININE 0.69 0.66 0.89  CALCIUM 7.8* 7.9* 8.2*  MG 2.2  --  2.0  PHOS 2.6  --  4.1   Liver Function Tests: No results for input(s): "AST", "ALT", "ALKPHOS", "BILITOT", "PROT", "ALBUMIN" in the last 72 hours. No results for input(s): "LIPASE", "AMYLASE" in the last 72 hours. CBC: Recent Labs    01/03/24 0436 01/04/24 0430  WBC 9.5 4.2  HGB 8.9* 10.5*  HCT 27.2* 31.3*  MCV 84.7 83.2  PLT 284 374   Cardiac Enzymes: No results for input(s): "CKTOTAL", "CKMB", "CKMBINDEX", "TROPONINIHS" in the last 72 hours. BNP: No results for input(s): "BNP" in the last 72 hours. D-Dimer: No results for input(s): "DDIMER" in the last 72 hours. Hemoglobin A1C: No results for input(s): "HGBA1C" in the last 72 hours. Fasting Lipid Panel: No results for input(s): "CHOL", "HDL", "LDLCALC", "TRIG", "CHOLHDL", "LDLDIRECT" in the last 72 hours. Thyroid Function Tests: No results for input(s): "TSH", "T4TOTAL", "T3FREE", "THYROIDAB" in the last 72 hours.  Invalid input(s): "FREET3" Anemia Panel: No results for input(s): "VITAMINB12", "FOLATE", "FERRITIN", "TIBC", "IRON", "RETICCTPCT" in the last 72 hours.   Radiology: DG Abd 1 View Result Date: 01/04/2024 CLINICAL DATA:  Abdominal distention. Postoperative day 4 from exploratory laparotomy and right hemicolectomy. EXAM: ABDOMEN - 1 VIEW COMPARISON:  01/01/2024 FINDINGS: Gas dilated small bowel loops are identified in the left abdomen measuring up to 4.7 cm diameter. Gas dilated loops in the lower left abdomen and right abdomen are less distended. No substantial colonic gas. Rigler Sign associated with small bowel in the left abdomen and lucency over the liver is consistent with intraperitoneal free air. Surgical skin staples overlie the midline. IMPRESSION: 1. Gas dilated  small bowel loops in the left abdomen with less distended loops in the lower left abdomen and right abdomen. Given the patient is 4 days out from surgery, features are felt to be compatible with postoperative ileus. As of all vein obstruction is not excluded, close follow-up warranted. 2. Intraperitoneal free air is not unexpected given recent surgery. Electronically Signed   By: Kennith Center M.D.   On: 01/04/2024 08:23   DG Abd Portable 1V Result Date: 01/01/2024 CLINICAL DATA:  Nasogastric tube placement. Radiologic records indicates laparotomy yesterday. EXAM: PORTABLE ABDOMEN - 1 VIEW COMPARISON:  Radiograph 12/30/2023 FINDINGS: Tip and side port of the enteric tube below the diaphragm in the stomach. No bowel dilatation in the upper abdomen. Midline abdominal staples seen. Free air under the right hemidiaphragm is not unexpected given recent abdominal surgery. IMPRESSION: 1. Tip and side port of the enteric tube below the diaphragm in the stomach. 2. Free air under the right hemidiaphragm is not unexpected given recent abdominal surgery. Electronically Signed   By: Narda Rutherford M.D.   On: 01/01/2024 21:39   DG Abd 1 View Result Date: 12/30/2023 CLINICAL DATA:  Nasogastric tube placement. EXAM: ABDOMEN - 1 VIEW COMPARISON:  Earlier today FINDINGS: Tip and side port of the enteric tube below the diaphragm in the stomach. Gaseous small bowel distension centrally. IMPRESSION: Tip and side port of the enteric tube below the diaphragm in the stomach. Electronically Signed   By: Narda Rutherford M.D.   On: 12/30/2023 18:02   DG Abd 1 View Result Date: 12/30/2023 CLINICAL DATA:  Abdominal distention, watery stools. EXAM: ABDOMEN - 1 VIEW COMPARISON:  CT abdomen pelvis dated 12/29/2023 FINDINGS: Multiple dilated loops  of small bowel are seen in the midabdomen, similar to prior exam accounting for differences in technique. Gas overlies the rectum. Degenerative changes are seen in the spine. IMPRESSION:  Multiple dilated loops of small bowel in the midabdomen, similar to prior exam accounting for differences in technique. These findings likely reflect bowel obstruction. Electronically Signed   By: Romona Curls M.D.   On: 12/30/2023 16:59   CT ABDOMEN PELVIS W CONTRAST Result Date: 12/29/2023 CLINICAL DATA:  Nausea, vomiting, generalized abdominal pain for 1-2 days. Recent diagnosis of colon cancer. Rule out obstruction. EXAM: CT ABDOMEN AND PELVIS WITH CONTRAST TECHNIQUE: Multidetector CT imaging of the abdomen and pelvis was performed using the standard protocol following bolus administration of intravenous contrast. RADIATION DOSE REDUCTION: This exam was performed according to the departmental dose-optimization program which includes automated exposure control, adjustment of the mA and/or kV according to patient size and/or use of iterative reconstruction technique. CONTRAST:  OMNIPAQUE IOHEXOL 300 MG/ML  SOLN COMPARISON:  CT abdomen and pelvis 10/14/2023 FINDINGS: Lower chest: No acute abnormality. Hepatobiliary: No focal hepatic lesion. Unremarkable gallbladder and biliary tree. Pancreas: Unremarkable. Spleen: Unremarkable. Adrenals/Urinary Tract: Stable adrenal glands. No urinary calculi or hydronephrosis. Unremarkable bladder. Stomach/Bowel: Stomach is within normal limits. Fluid-filled mid and distal small bowel at the upper limits of normal in caliber and increased compared to 10/14/2023. The cecum and ascending colon are fluid-filled with abrupt transition point in the proximal transverse colon at the site of the mass seen on colonoscopy 12/20/2023. There is increased wall thickening of the cecum and ascending colon compared to 10/14/2023. There are few additional segments of wall thickening in the mid and distal small bowel. Diverticulosis in the descending colon. Trace adjacent stranding and fluid. Vascular/Lymphatic: Aortic atherosclerosis. No enlarged abdominal or pelvic lymph nodes.  Reproductive: No acute abnormality. Other: No free intraperitoneal air. Musculoskeletal: No acute fracture.  No destructive osseous lesion. IMPRESSION: 1. Worsening obstruction in the proximal transverse colon secondary to the known adenocarcinoma compared with CT 10/14/2023. 2. Increased wall thickening of the cecum and ascending colon compared to 10/14/2023. There are few additional segments of wall thickening in the mid and distal small bowel. Findings are favored to represent nonspecific enterocolitis. 3. Question mild diverticulitis in the descending colon. 4. Aortic Atherosclerosis (ICD10-I70.0). Electronically Signed   By: Minerva Fester M.D.   On: 12/29/2023 19:18    ECHO ordered  TELEMETRY reviewed by me 01/04/2024: Atrial fibrillation rate 140s  EKG reviewed by me: None for review in chart, telemetry strip from 8:51 this a.m. with rapid atrial fibrillation rate 190  Data reviewed by me 01/04/2024: last 24h vitals tele labs imaging I/O ED provider note, admission H&P, general surgery notes, hospitalist progress note  Principal Problem:   Large bowel obstruction (HCC) Active Problems:   HTN (hypertension), benign   Type 2 diabetes mellitus (HCC)   Malignant neoplasm of transverse colon (HCC)   Dementia with behavioral disturbance (HCC)   Electrolyte abnormality   Colon adenocarcinoma [facility])   Palliative care encounter    ASSESSMENT AND PLAN:  Aarnav Steagall. is a 78 y.o. male  with a past medical history of hypertension, type II diabetes, lewy body dementia, adenocarcinoma of colon who presented to the ED on 12/29/2023 for abdominal pain. Underwent ex lap with R hemicolectomy 12/31/2023. Earlier this AM he developed AF RVR and hypotension. Cardiology was consulted for further evaluation.   # Atrial fibrillation RVR # New onset atrial fibrillation # Lactic acidosis # S/p R  hemicolectomy Patient initially presented to the ED with complaints of abdominal pain, taken for exploratory  laparotomy 3/2 and underwent right hemicolectomy for adenocarcinoma of the colon.  Recovering well until this morning when he developed atrial fibrillation RVR and hypotension.  No prior history of A-fib.  Rapid response was called and he was transferred to the ICU. -Echo ordered -Continue IV amiodarone for rate/rhythm control. -Will start IV heparin for anticoagulation.  Discussed with general surgery who have no objections to initiation of anticoagulation. -Further recommendations per general surgery, primary team. -No plan for further cardiac diagnostics at this time. -Initial troponin 27, most consistent with demand/supply mismatch and not ACS.  Will continue to trend.   This patient's plan of care was discussed and created with Dr. Juliann Pares and he is in agreement.  Signed: Gale Journey, PA-C  01/04/2024, 9:23 AM [facility]

## 2024-01-04 NOTE — Progress Notes (Addendum)
 Update: approximately an hour after seeing patient notified by Laqueta Due PA-C and nursing staff that patient had increased tachycardia and hypotension. Abdomen still distended, no nausea or vomiting and no respiratory compromise. Will get STAT EKG, troponin and lactic acid. Will give 1 L Bolus of fluid and plan for CT A/P to assess for intra-abdominal etiology for clinical deterioation. Low threshold for transfer to unit, make NPO.           [facility] SURGICAL PROGRESS NOTE  Hospital Day(s): 6.   Post op day(s): 4 Days Post-Op.   Interval History:  Patient seen and examined Patient did have some clear liquid diet.  He had a large bowel movement this morning.  He reports feeling a little bit bloated but denies any nausea or vomiting.  He still is having pain. Downtrending leukocytosis.    Vital signs in last 24 hours: [min-max] current  Temp:  [97.6 F (36.4 C)-99.9 F (37.7 C)] 97.6 F (36.4 C) (03/06 0751) Pulse Rate:  [88-116] 88 (03/06 0751) Resp:  [20-26] 26 (03/06 0751) BP: (110-157)/(69-104) 133/104 (03/06 0751) SpO2:  [94 %-96 %] 96 % (03/06 0434)     Height: 6\' 1"  (185.4 cm) Weight: 69.2 kg BMI (Calculated): 20.13   Intake/Output last 2 shifts:  03/05 0701 - 03/06 0700 In: -  Out: 70 [Urine:70]   Physical Exam:  Constitutional: alert, cooperative and no distress  Respiratory: breathing non-labored at rest  Cardiovascular: regular rate and sinus rhythm  Gastrointestinal: soft, incisional tenderness, distended, no rebound/guarding Integumentary: Laparotomy is CDI with staples and honeycomb; no erythema.  Honeycomb removed and there is a little bit of dried blood on the incision but no erythema or drainage  Labs:     Latest Ref Rng & Units 01/04/2024    4:30 AM 01/03/2024    4:36 AM 01/02/2024    4:22 AM  CBC  WBC 4.0 - 10.5 K/uL 4.2  9.5  11.2   Hemoglobin 13.0 - 17.0 g/dL 16.1  8.9  8.1   Hematocrit 39.0 - 52.0 % 31.3  27.2  24.8   Platelets  150 - 400 K/uL 374  284  279       Latest Ref Rng & Units 01/04/2024    4:30 AM 01/03/2024    4:36 AM 01/02/2024    4:22 AM  CMP  Glucose 70 - 99 mg/dL 096  045  409   BUN 8 - 23 mg/dL 25  14  23    Creatinine 0.61 - 1.24 mg/dL 8.11  9.14  7.82   Sodium 135 - 145 mmol/L 134  136  140   Potassium 3.5 - 5.1 mmol/L 4.5  4.1  4.2   Chloride 98 - 111 mmol/L 98  103  106   CO2 22 - 32 mmol/L 24  27  25    Calcium 8.9 - 10.3 mg/dL 8.2  7.9  7.8      Imaging studies: No new pertinent imaging studies   Assessment/Plan:  78 y.o. male 4 Days Post-Op s/p right hemicolectomy for colon CA   - He is slightly bloated this AM, we will keep on CLD for now.  Continue boost supplementation    - Monitor abdominal examination; on-going bowel function   - Pain control prn; added PO medications  - Antiemetics prn  - Monitor leukocytosis; resolved  - Monitor H&H  - Mobilize; PT/OT - needs to ambulate   - Pathology: Invasive adenocarcinoma, 0/22 lymph nodes   - Further management  per primary service; we will follow   I discussed with the patient's family that if there is no significant progression in the next several days we may need a repeat scan but would hold off for now.  I reviewed his KUB and there are dilated loops of bowel with decreasing amounts of free air.  He is at significant risk for an ileus given the large surgery and intraoperatively there was some hemorrhagic changes in the mesentery.  The fact that he had a bowel movement this morning and is reassuring we will continue to monitor his abdominal exam  All of the above findings and recommendations were discussed with the patient, patient's family (wife and son at bedside), and the medical team, and all of patient's questions were answered to their expressed satisfaction.  Baker Pierini, M.D. [facility]

## 2024-01-04 NOTE — Progress Notes (Signed)
 Pt noted by NT to have elevated MEWS score. Upon reassesment pt's HR noted to be as high as 130 and as low as 30 on Dinamap. BP on Dinamap noted to be 80's/50's with patient being pale, clammy and c/o abd pain.   RR called and manual BP taken which was lower that previous. See flowsheets. EKG obtained and found to be 190 bpm; irregularly irregular  VO for 1L LR ordered and running; improvement in hypotension initially, pt's BP dropped again while on bolus. Pt placed on telemetry and transported to ICU bed 8 in Trendelenburg. Bedside report given to [facility].

## 2024-01-04 NOTE — Procedures (Signed)
 Procedure Note  Preoperative diagnosis: Undifferentiated shock, concern for anastomotic leak Post-Operative diagnosis: Same Surgeon: Baker Pierini, MD EBL minimal  The patient presented with undifferentiated shock and need for pressor.  Informed consent was obtained from his wife.  The skin of his left neck was then prepped and draped along with his left chest.  A surgical timeout was called identifying correct patient, site, side and procedure.  He was draped fully in the standard fashion.  Patient comfort was obtained with 1% lidocaine with epinephrine.  A finder needle was used to access the left subclavian vein.  This was done in the first attempt.  There was good drawback of venous appearing blood.  A wire was inserted into the vein without resistance.  A skin nick was made over the wire and the tract was dilated with a dilator.  Using Seldinger technique the triple-lumen venous catheter was inserted into the left subclavian vein.  There was a good drawl Bracht of blood in each port.  Each port also flushed well.  The catheter was then secured to the left chest with a 0 silk suture.  The patient tolerated the procedure well.  A x-ray was obtained confirming venous insertion and absence of pneumothorax.

## 2024-01-04 NOTE — Consult Note (Signed)
 [facility] CONSULT NOTE  Patient Care Team: [name], MD as PCP - General (Internal Medicine) Benita Gutter, RN as Oncology Nurse Navigator Earna Coder, MD as Consulting Physician (Oncology)  CHIEF COMPLAINTS/PURPOSE OF CONSULTATION: colon cancer  HISTORY OF PRESENTING ILLNESS:  Alex Silversmith Sr. 78 y.o.  male pleasant patient with a history of multiple medical problems-including dementia is currently admitted to hospital for loss of obstruction from underlying colonic mass.  Patient recently diagnosed with adenocarcinoma the colon, and given the concerns for impending obstruction patient underwent hemicolectomy in the hospital.  Oncology has been consulted to discuss plan of care from his cancer perspective prior to discharge.  Unfortunately this morning patient status declined significantly with severe tachycardia A-fib RVR-with elevation of lactic acid.  Concern for anastomotic leak patient is currently being prepped to proceed with exploration/surgery.  Patient resting comfortably otherwise.  I met with the family in the waiting room.   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  Only wears upper    SURGICAL  HISTORY: Past Surgical History:  Procedure Laterality Date   AMPUTATION TOE Right 03/01/2020   Procedure: AMPUTATION TOE;  Surgeon: [name], DPM;  Location: [facility] ORS;  Service: Podiatry;  Laterality: Right;   AMPUTATION TOE Right 01/03/2023   Procedure: AMPUTATION TOE, 2nd right;  Surgeon: [name], DPM;  Location: [facility] ORS;  Service: Podiatry;  Laterality: Right;   APPENDECTOMY     BIOPSY  12/20/2023   Procedure: BIOPSY;  Surgeon: Norma Fredrickson, Boykin Nearing, MD;  Location: [facility] ENDOSCOPY;  Service: Gastroenterology;;   BONE EXCISION Right 01/03/2023   Procedure: Exostectomy interphalangeal joint right great toe;  Surgeon: [name], DPM;  Location: [facility] ORS;  Service: Podiatry;  Laterality: Right;   CATARACT EXTRACTION W/PHACO Left 09/06/2021   Procedure: CATARACT EXTRACTION PHACO AND INTRAOCULAR LENS PLACEMENT (IOC) LEFT 2.71 00:30.6;  Surgeon: Nevada Crane, MD;  Location: [facility]ERY [facility];  Service: Ophthalmology;  Laterality: Left;  Diabetic   COLONOSCOPY WITH PROPOFOL N/A 12/20/2023   Procedure: COLONOSCOPY WITH PROPOFOL;  Surgeon: Toledo, Boykin Nearing, MD;  Location: [facility] ENDOSCOPY;  Service: Gastroenterology;  Laterality: N/A;   ESOPHAGOGASTRODUODENOSCOPY (EGD) WITH PROPOFOL N/A 12/20/2023   Procedure: ESOPHAGOGASTRODUODENOSCOPY (EGD) WITH PROPOFOL;  Surgeon: Toledo, Boykin Nearing, MD;  Location: [facility] ENDOSCOPY;  Service: Gastroenterology;  Laterality: N/A;   EYE SURGERY     age 4   HERNIA REPAIR     INSERTION OF MESH  06/23/2023   Procedure: INSERTION OF MESH;  Surgeon: Sung Amabile, DO;  Location: [facility] ORS;  Service: General;;   KIDNEY STONE SURGERY     LAPAROTOMY  Right 12/31/2023   Procedure: EXPLORATORY LAPAROTOMY WITH RIGHT COLECTOMY;  Surgeon: Kandis Cocking, MD;  Location: [facility] ORS;  Service: General;  Laterality: Right;   REMOVAL RETAINED LENS Right 04/19/2021   Procedure: CATARACT EXTRACTION PHACO,  RIGHT VISION BLUE 0.20 00:01.4 case aborted;  Surgeon: Nevada Crane, MD;  Location: [facility]GERY [facility];  Service: Ophthalmology;  Laterality: Right;  Diabetic - oral meds   SHOULDER ARTHROSCOPY WITH BICEPS TENDON REPAIR Right 10/06/2016   Procedure: SHOULDER ARTHROSCOPY WITH BICEPS TENDON REPAIR;  Surgeon: Christena Flake, MD;  Location: [facility] ORS;  Service: Orthopedics;  Laterality: Right;   SHOULDER ARTHROSCOPY WITH OPEN ROTATOR CUFF REPAIR Right 10/06/2016   Procedure: SHOULDER ARTHROSCOPY WITH OPEN ROTATOR CUFF REPAIR;  Surgeon: Christena Flake, MD;  Location: [facility] OR[facility];  Service: Orthopedics;  Laterality: Right;   SHOULDER ARTHROSCOPY WITH SUBACROMIAL DECOMPRESSION Right 10/06/2016   Procedure: SHOULDER ARTHROSCOPY WITH SUBACROMIAL DECOMPRESSION AND DEBRIDEMENT;  Surgeon: Christena Flake, MD;  Location: [facility] ORS;  Service: Orthopedics;  Laterality: Right;   SUBMUCOSAL TATTOO INJECTION  12/20/2023   Procedure: SUBMUCOSAL TATTOO INJECTION;  Surgeon: Toledo, Boykin Nearing, MD;  Location: [facility] ENDOSCOPY;  Service: Gastroenterology;;   TONSILLECTOMY      SOCIAL HISTORY: Social History   Socioeconomic History   Marital status: Married    Spouse name: Burna Mortimer   Number of children: 3   Years of education: Not on file   Highest education level: Not on file  Occupational History   Not on file  Tobacco Use   Smoking status: Former    Current packs/day: 0.00    Average packs/day: 0.3 packs/day for 10.0 years (2.5 ttl pk-yrs)    Types: Cigarettes    Start date: 10/01/1959    Quit date: 09/30/1969    Years since quitting: 54.2    Passive exposure: Past   Smokeless tobacco: Former  Building services engineer status: Never Used  Substance and Sexual Activity   Alcohol use: No   Drug use: No   Sexual activity: Not on file  Other Topics Concern   Not on file  Social History Narrative   Not on file   Social Drivers of Health   Financial Resource Strain: Low Risk  (07/18/2023)   Received from [facility]   Overall Financial Resource Strain  (CARDIA)    Difficulty of Paying Living Expenses: Not hard at all  Food Insecurity: No Food Insecurity (12/29/2023)   Hunger Vital Sign    Worried About Running Out of Food in the Last Year: Never true    Ran Out of Food in the Last Year: Never true  Transportation Needs: No Transportation Needs (12/29/2023)   PRAPARE - Transportation    Lack of Transportation (Medical): No    Lack of Transportation (Non-Medical): No  Physical Activity: Not on file  Stress: Not on file  Social Connections: Moderately Integrated (12/30/2023)   Social Connection and Isolation Panel [NHANES]    Frequency of Communication with Friends and Family: More than three times a week    Frequency of Social Gatherings with Friends and Family: Twice a week    Attends Religious Services: More than 4 times per year    Active Member of [employer] or Organizations: No    Attends Banker Meetings: Never    Marital Status: Married  Catering manager Violence: Not At Risk (12/29/2023)   Humiliation, Afraid, Rape, and Kick questionnaire    Fear of Current or Ex-Partner: No  Emotionally Abused: No    Physically Abused: No    Sexually Abused: No    FAMILY HISTORY: Family History  Family history unknown: Yes    ALLERGIES:  has no known allergies.  MEDICATIONS:  Current Facility-Administered Medications  Medication Dose Route Frequency Provider Last Rate Last Admin   [MAR Hold] acetaminophen (TYLENOL) tablet 650 mg  650 mg Oral Q6H PRN Donovan Kail, PA-C   650 mg at 01/03/24 1953   amiodarone (NEXTERONE PREMIX) 360-4.14 MG/200ML-% (1.8 mg/mL) IV infusion  60 mg/hr Intravenous Continuous Marrion Coy, MD 33.3 mL/hr at 01/04/24 1438 60 mg/hr at 01/04/24 1438   Followed by   amiodarone (NEXTERONE PREMIX) 360-4.14 MG/200ML-% (1.8 mg/mL) IV infusion  30 mg/hr Intravenous Continuous Marrion Coy, MD       digoxin (LANOXIN) 0.25 MG/ML injection            [MAR Hold] feeding supplement (BOOST / RESOURCE BREEZE)  liquid 1 Container  1 Container Oral TID BM Donovan Kail, PA-C       heparin ADULT infusion 100 units/mL (25000 units/267mL)  1,100 Units/hr Intravenous Continuous Lowella Bandy, RPH   Stopped at 01/04/24 1430   [MAR Hold] hydrALAZINE (APRESOLINE) injection 5 mg  5 mg Intravenous Q6H PRN Kandis Cocking, MD       [facility] Hold] HYDROmorphone (DILAUDID) injection 0.5 mg  0.5 mg Intravenous Q4H PRN Marrion Coy, MD   0.5 mg at 01/04/24 1137   [MAR Hold] insulin aspart (novoLOG) injection 0-9 Units  0-9 Units Subcutaneous TID WC Marrion Coy, MD   2 Units at 01/04/24 1226   [MAR Hold] iohexol (OMNIPAQUE) 300 MG/ML solution 30 mL  30 mL Oral Once PRN Donovan Kail, PA-C       lactated ringers infusion   Intravenous Continuous Marrion Coy, MD 75 mL/hr at 01/04/24 1438 Continued from Pre-op at 01/04/24 1438   [MAR Hold] methocarbamol (ROBAXIN) tablet 500 mg  500 mg Oral Q8H PRN Mila Merry A, RPH   500 mg at 01/03/24 1755   [MAR Hold] multivitamin with minerals tablet 1 tablet  1 tablet Oral Daily Marrion Coy, MD       [facility] Hold] norepinephrine (LEVOPHED) 16 mg in (0.064 mg/mL) premix infusion  0-40 mcg/min Intravenous Titrated Ezequiel Essex, NP       [MAR Hold] norepinephrine (LEVOPHED) 4mg  in (0.016 mg/mL) premix infusion  0-40 mcg/min Intravenous Titrated Ezequiel Essex, NP       [MAR Hold] ondansetron [facility]) injection 4 mg  4 mg Intravenous Q4H PRN Kandis Cocking, MD       [facility] Hold] oxyCODONE (Oxy IR/ROXICODONE) immediate release tablet 5 mg  5 mg Oral Q4H PRN Lynden Oxford R, PA-C   5 mg at 01/04/24 1103   [MAR Hold] pantoprazole (PROTONIX) EC tablet 40 mg  40 mg Oral BID Mila Merry A, RPH   40 mg at 01/03/24 2002   [MAR Hold] piperacillin-tazobactam (ZOSYN) IVPB 3.375 g  3.375 g Intravenous Q8H Kandis Cocking, MD   3.375 g at 01/04/24 1448   [MAR Hold] rivastigmine (EXELON) capsule 3 mg  3 mg Oral BID Kandis Cocking, MD   3 mg at 01/03/24 2002    Facility-Administered Medications Ordered in Other Encounters  Medication Dose Route Frequency Provider Last Rate Last Admin   etomidate (AMIDATE) injection   Intravenous Anesthesia Intra-op Corinda Gubler, MD   14 mg at 01/04/24 1452   fentaNYL (SUBLIMAZE) injection   Intravenous  Anesthesia Intra-op Corinda Gubler, MD   50 mcg at 01/04/24 1503   rocuronium [facility]) injection   Intravenous Anesthesia Intra-op Corinda Gubler, MD   80 mg at 01/04/24 1452    PHYSICAL EXAMINATION:   Vitals:   01/04/24 1000 01/04/24 1103  BP: 98/62 (!) 89/59  Pulse: 80 64  Resp: (!) 29 (!) 38  Temp:    SpO2: 98% 97%   Filed Weights   12/29/23 1418 12/30/23 0028  Weight: 156 lb 1.4 oz (70.8 kg) 152 lb 8.9 oz (69.2 kg)    Physical Exam Vitals and nursing note reviewed.  Constitutional:      Appearance: He is ill-appearing and toxic-appearing.  HENT:     Head: Normocephalic and atraumatic.  Cardiovascular:     Rate and Rhythm: Normal rate. Rhythm irregular.  Pulmonary:     Comments: Decreased breath sounds bilaterally.  Abdominal:     General: There is distension.     Palpations: Abdomen is soft.  Musculoskeletal:        General: Normal range of motion.     Cervical back: Normal range of motion.  Skin:    General: Skin is warm.  Neurological:     General: No focal deficit present.  Psychiatric:        Judgment: Judgment normal.     LABORATORY DATA:  I have reviewed the data as listed Lab Results  Component Value Date   WBC 4.2 01/04/2024   HGB 10.5 (L) 01/04/2024   HCT 31.3 (L) 01/04/2024   MCV 83.2 01/04/2024   PLT 374 01/04/2024   Recent Labs    10/14/23 0324 12/27/23 1515 12/29/23 1420 12/30/23 0607 01/01/24 0440 01/02/24 0422 01/03/24 0436 01/04/24 0430  NA 130* 132* 130*   < > 137 140 136 134*  K 3.2* 3.9 3.2*   < > 4.2 4.2 4.1 4.5  CL 94* 94* 91*   < > 106 106 103 98  CO2 26 26 26    < > 23 25 27 24   GLUCOSE 174* 190* 194*   < > 136* 119* 126* 169*  BUN 14 21 29*   <  > 24* 23 14 25*  CREATININE 0.81 0.79 0.78   < > 0.87 0.69 0.66 0.89  CALCIUM 8.4* 9.2 8.4*   < > 7.6* 7.8* 7.9* 8.2*  GFRNONAA >60 >60 >60   < > >60 >60 >60 >60  PROT 6.8 7.3 6.7  --   --   --   --   --   ALBUMIN 3.6 4.0 3.5  --  2.3*  --   --   --   AST 22 19 17   --   --   --   --   --   ALT 17 17 15   --   --   --   --   --   ALKPHOS 104 93 91  --   --   --   --   --   BILITOT 0.6 1.0 1.1  --   --   --   --   --    < > = values in this interval not displayed.    RADIOGRAPHIC STUDIES: I have personally reviewed the radiological images as listed and agreed with the findings in the report. CT ABDOMEN PELVIS W CONTRAST Result Date: 01/04/2024 CLINICAL DATA:  Status post laparotomy and right hemicolectomy for obstructing right-sided colon carcinoma on 12/31/2023 with primary anastomosis. Hypotension, tachycardia, abdominal pain and suspected anastomotic leak clinically. EXAM:  CT ABDOMEN AND PELVIS WITH CONTRAST TECHNIQUE: Multidetector CT imaging of the abdomen and pelvis was performed using the standard protocol following bolus administration of intravenous contrast. RADIATION DOSE REDUCTION: This exam was performed according to the departmental dose-optimization program which includes automated exposure control, adjustment of the mA and/or kV according to patient size and/or use of iterative reconstruction technique. CONTRAST:  OMNIPAQUE IOHEXOL 300 MG/ML  SOLN COMPARISON:  12/29/2023 FINDINGS: Lower chest: Small bilateral pleural effusions, right greater than left with associated bibasilar atelectasis. Hepatobiliary: No focal liver abnormality is seen. Some gallbladder distension without overt gallbladder inflammation, visible calculi or biliary ductal dilatation. Pancreas: Atrophic pancreas without inflammation or masses. No pancreatic ductal dilatation. Spleen: Normal in size without focal abnormality. Adrenals/Urinary Tract: Adrenal glands are unremarkable. Kidneys are normal, without renal  calculi, focal lesion, or hydronephrosis. Tiny amount of air in the bladder lumen likely relates to prior catheterization. Stomach/Bowel: Small amount of fluid in the stomach. Air in multiple small bowel loops with mild small bowel dilatation approaching 4 cm in maximum diameter. Findings are consistent with ileus. At the level of the right-sided ileocolic anastomosis located just along the inferior aspect of the gallbladder, there is suspicion by CT of an anastomotic leak with visible air communicating from the anterior aspect of the anastomosis to a large pocket of air and fluid in the anterior right perihepatic space continuing superiorly to the subphrenic space. This pocket of fluid and air measures up to 12.5 cm in transverse diameter and 16 cm in height. Multiple other pockets of free air are present in the anterior peritoneal cavity including the left subphrenic space superior to the stomach. Multiple areas of associated free fluid throughout the peritoneal cavity with the second largest area of fluid along the lateral left upper quadrant and left subphrenic space abutting the spleen. Dependent fluid also present in the pelvis. Vascular/Lymphatic: Stable severe atherosclerosis of the abdominal aorta without aneurysmal disease. No enlarged lymph nodes identified in the abdomen or pelvis. Reproductive: Prostate is unremarkable. Other: No hernias. Musculoskeletal: No acute or significant osseous findings. IMPRESSION: 1. Findings consistent with anastomotic leak at the level of the right-sided ileocolic anastomosis located just along the inferior aspect of the gallbladder. There is visible air communicating from the anterior aspect of the anastomosis to a large pocket of air and fluid in the anterior right perihepatic space continuing superiorly to the subphrenic space. This pocket of fluid and air measures up to 12.5 cm in transverse diameter and 16 cm in height. Multiple other pockets of free air are present in  the anterior peritoneal cavity including the left subphrenic space superior to the stomach. Multiple areas of associated free fluid throughout the peritoneal cavity with the second largest area of fluid along the lateral left upper quadrant and left subphrenic space abutting the spleen. Dependent fluid also present in the pelvis. 2. Small bilateral pleural effusions, right greater than left with associated bibasilar atelectasis. 3. Atrophic pancreas without inflammation or masses. 4. Stable severe atherosclerosis of the abdominal aorta without aneurysmal disease. 5. I reviewed findings directly in person with Dr. Maurine Minister shortly after the CT was performed. Electronically Signed   By: Irish Lack M.D.   On: 01/04/2024 13:35   DG Abd 1 View Result Date: 01/04/2024 CLINICAL DATA:  Abdominal distention. Postoperative day 4 from exploratory laparotomy and right hemicolectomy. EXAM: ABDOMEN - 1 VIEW COMPARISON:  01/01/2024 FINDINGS: Gas dilated small bowel loops are identified in the left abdomen measuring up to 4.7 cm  diameter. Gas dilated loops in the lower left abdomen and right abdomen are less distended. No substantial colonic gas. Rigler Sign associated with small bowel in the left abdomen and lucency over the liver is consistent with intraperitoneal free air. Surgical skin staples overlie the midline. IMPRESSION: 1. Gas dilated small bowel loops in the left abdomen with less distended loops in the lower left abdomen and right abdomen. Given the patient is 4 days out from surgery, features are felt to be compatible with postoperative ileus. As of all vein obstruction is not excluded, close follow-up warranted. 2. Intraperitoneal free air is not unexpected given recent surgery. Electronically Signed   By: Kennith Center M.D.   On: 01/04/2024 08:23   DG Abd Portable 1V Result Date: 01/01/2024 CLINICAL DATA:  Nasogastric tube placement. Radiologic records indicates laparotomy yesterday. EXAM: PORTABLE ABDOMEN - 1  VIEW COMPARISON:  Radiograph 12/30/2023 FINDINGS: Tip and side port of the enteric tube below the diaphragm in the stomach. No bowel dilatation in the upper abdomen. Midline abdominal staples seen. Free air under the right hemidiaphragm is not unexpected given recent abdominal surgery. IMPRESSION: 1. Tip and side port of the enteric tube below the diaphragm in the stomach. 2. Free air under the right hemidiaphragm is not unexpected given recent abdominal surgery. Electronically Signed   By: Narda Rutherford M.D.   On: 01/01/2024 21:39   DG Abd 1 View Result Date: 12/30/2023 CLINICAL DATA:  Nasogastric tube placement. EXAM: ABDOMEN - 1 VIEW COMPARISON:  Earlier today FINDINGS: Tip and side port of the enteric tube below the diaphragm in the stomach. Gaseous small bowel distension centrally. IMPRESSION: Tip and side port of the enteric tube below the diaphragm in the stomach. Electronically Signed   By: Narda Rutherford M.D.   On: 12/30/2023 18:02   DG Abd 1 View Result Date: 12/30/2023 CLINICAL DATA:  Abdominal distention, watery stools. EXAM: ABDOMEN - 1 VIEW COMPARISON:  CT abdomen pelvis dated 12/29/2023 FINDINGS: Multiple dilated loops of small bowel are seen in the midabdomen, similar to prior exam accounting for differences in technique. Gas overlies the rectum. Degenerative changes are seen in the spine. IMPRESSION: Multiple dilated loops of small bowel in the midabdomen, similar to prior exam accounting for differences in technique. These findings likely reflect bowel obstruction. Electronically Signed   By: Romona Curls M.D.   On: 12/30/2023 16:59   CT ABDOMEN PELVIS W CONTRAST Result Date: 12/29/2023 CLINICAL DATA:  Nausea, vomiting, generalized abdominal pain for 1-2 days. Recent diagnosis of colon cancer. Rule out obstruction. EXAM: CT ABDOMEN AND PELVIS WITH CONTRAST TECHNIQUE: Multidetector CT imaging of the abdomen and pelvis was performed using the standard protocol following bolus  administration of intravenous contrast. RADIATION DOSE REDUCTION: This exam was performed according to the departmental dose-optimization program which includes automated exposure control, adjustment of the mA and/or kV according to patient size and/or use of iterative reconstruction technique. CONTRAST:  OMNIPAQUE IOHEXOL 300 MG/ML  SOLN COMPARISON:  CT abdomen and pelvis 10/14/2023 FINDINGS: Lower chest: No acute abnormality. Hepatobiliary: No focal hepatic lesion. Unremarkable gallbladder and biliary tree. Pancreas: Unremarkable. Spleen: Unremarkable. Adrenals/Urinary Tract: Stable adrenal glands. No urinary calculi or hydronephrosis. Unremarkable bladder. Stomach/Bowel: Stomach is within normal limits. Fluid-filled mid and distal small bowel at the upper limits of normal in caliber and increased compared to 10/14/2023. The cecum and ascending colon are fluid-filled with abrupt transition point in the proximal transverse colon at the site of the mass seen on colonoscopy 12/20/2023. There  is increased wall thickening of the cecum and ascending colon compared to 10/14/2023. There are few additional segments of wall thickening in the mid and distal small bowel. Diverticulosis in the descending colon. Trace adjacent stranding and fluid. Vascular/Lymphatic: Aortic atherosclerosis. No enlarged abdominal or pelvic lymph nodes. Reproductive: No acute abnormality. Other: No free intraperitoneal air. Musculoskeletal: No acute fracture.  No destructive osseous lesion. IMPRESSION: 1. Worsening obstruction in the proximal transverse colon secondary to the known adenocarcinoma compared with CT 10/14/2023. 2. Increased wall thickening of the cecum and ascending colon compared to 10/14/2023. There are few additional segments of wall thickening in the mid and distal small bowel. Findings are favored to represent nonspecific enterocolitis. 3. Question mild diverticulitis in the descending colon. 4. Aortic Atherosclerosis  (ICD10-I70.0). Electronically Signed   By: Minerva Fester M.D.   On: 12/29/2023 71:68    # 78 year old male patient with multiple medical problems dementia PVD including recently diagnosed colon cancer status post resection  # Colon cancer adenocarcinoma-s/p resection.  # Acute decompensation-A-fib with RVR lactic acidosis-imaging concerning for anastomotic leak.  # Multiple other comorbidities including-Lewy body dementia/PVD etc.  Recommendations/plan:  # I reviewed the staging with patient's family including 2 sons and wife in the waiting room.  Patient has stage II colon cancer with about a 70 to 80% chance of cure with surgery alone.  No role for neoadjuvant therapy especially given his age other comorbidities and also given the acute decompensation.will check MSI.   # Defer to primary team/surgical team with regards to his acute decompensation/ anastomotic leak repair.   Thank you Dr.Zhang  for allowing me to participate in the care of your pleasant patient. Please do not hesitate to contact me with questions or concerns in the interim. Above plan of care was discussed with patient family's in detail.  Will cancel appointments at the cancer center for now. Discussed with Dr.Zhang.     Earna Coder, MD 01/04/2024 3:25 PM

## 2024-01-04 NOTE — Progress Notes (Addendum)
   01/04/24 0845  Spiritual Encounters  Type of Visit Initial  Care provided to: [facility] partners present during encounter Physician  Reason for visit Code  OnCall Visit Yes   On-Call Chaplain visited patient's son on 2C Unit (208) due to RRT page to the [place] phone.  Chaplain provided a compassionate presence and empathy.  Patient's son asked about AD and Chaplain explained it and said she's return to follow-up; however, Chaplain made son aware that patient may not be in position to receive AD conversation.  Chaplain will follow-up.    Rev. Rana M. Rolene Course Chaplain Resident [facility]

## 2024-01-04 NOTE — Anesthesia Procedure Notes (Signed)
 Arterial Line Insertion Performed by: Corinda Gubler, MD, anesthesiologist  Patient location: OR. Preanesthetic checklist: patient identified, IV checked, site marked, risks and benefits discussed, surgical consent, monitors and equipment checked, pre-op evaluation, timeout performed and anesthesia consent Patient sedated Left, radial was placed Catheter size: 20 G Hand hygiene performed  and maximum sterile barriers used   Attempts: 1 Procedure performed using ultrasound guided technique. Ultrasound Notes:anatomy identified, needle tip was noted to be adjacent to the nerve/plexus identified and no ultrasound evidence of intravascular and/or intraneural injection Following insertion, dressing applied and Biopatch. Post procedure assessment: normal and unchanged  Patient tolerated the procedure well with no immediate complications.

## 2024-01-04 NOTE — Progress Notes (Signed)
   01/04/24 1700  Spiritual Encounters  Type of Visit Follow up  Care provided to: [facility] partners present during encounter Nurse  Reason for visit Routine spiritual support  OnCall Visit Yes   Chaplain saw the family in the ICU waiting room while visiting another patient.  Chaplain asked if staff would provide the family an update while patient is in surgery.  Chaplain also brought the family some warm blankets while waiting.    Rev. Rana M. Earlene Plater, MDiv Chaplain Resident [facility]

## 2024-01-04 NOTE — Consult Note (Signed)
 NAME:  Alex Willmann., MRN:  [id number], DOB:  1946/03/17, LOS: 6 ADMISSION DATE:  12/29/2023, CONSULTATION DATE: 01/04/2024 REFERRING MD: , CHIEF COMPLAINT: Dr. 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 Donneta Romberg.  He presented to [facility] ER on 02/28 from the cancer center for possible bowel obstruction.  Per ER notes the pt c/o generalized abdominal pain along with nausea/vomiting, onset several days prior to presentation.  He also had poor po intake.    ED Course In the ER significant lab results were: Na+ 130/K+ 3.2/chloride 91/glucose 194/BUN 29/wbc 3.8/hgb 10.7.  CT Abd/Pelvis concerning for worsening obstruction in the proximal transverse colon secondary to the known adenocarcinoma and possible nonspecific enterocolitis.  It was reported the pt did have a bowel movement prior to arrival to the ER.  Medications received in the ER were: 1L IV fluid bolus/zofrain/morphine.  General Surgery consulted and recommended hospital admission and to keep pt NPO for potential procedure on 02/29.  Pt admitted to the medsurg unit per hospitalist team.  See detailed hospital course below under significant events.  CT Abd/Pelvis W Contrast: Worsening obstruction in the proximal transverse colon secondary to the known adenocarcinoma compared with CT 10/14/2023. Increased wall thickening of the cecum and ascending colon compared to 10/14/2023. There are few additional segments of wall thickening in the mid and distal small bowel. Findings are favored to represent nonspecific enterocolitis. Question mild diverticulitis in the descending colon. Aortic Atherosclerosis (ICD10-I70.0).  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  Degenerative Joint Disease of Lumbar Spine GERD   HLD Benign HTN  Kidney Stones Hypercholesterolemia  Type II Diabetes Mellitis  CKD Stage II   Anti-infectives (From admission, onward)    Start     Dose/Rate Route Frequency Ordered Stop   01/04/24 1400  [MAR Hold]  piperacillin-tazobactam (ZOSYN) IVPB 3.375 g        (MAR Hold since Thu 01/04/2024 at 1442.Hold Reason: Transfer to a Procedural area)   3.375 g 12.5 mL/hr over 240 Minutes Intravenous Every 8 hours 01/04/24 1233     01/01/24 0600  piperacillin-tazobactam (ZOSYN) IVPB 3.375 g        3.375 g 12.5 mL/hr over 240 Minutes Intravenous Every 8 hours 12/31/23 1427 01/02/24 0945   12/31/23 2200  metroNIDAZOLE (FLAGYL) tablet 1,000 mg  Status:  Discontinued        1,000 mg Oral  Once 12/30/23 1138 12/30/23 1832   12/31/23 2200  neomycin (MYCIFRADIN) tablet 1,000 mg  Status:  Discontinued        1,000 mg Oral  Once 12/30/23 1138 12/30/23 1832   12/31/23 1500  metroNIDAZOLE (FLAGYL) tablet 1,000 mg  Status:  Discontinued        1,000 mg Oral  Once 12/30/23 1138 12/30/23 1832   12/31/23 1500  neomycin (MYCIFRADIN) tablet 1,000 mg  Status:  Discontinued        1,000 mg Oral  Once 12/30/23 1138 12/30/23 1832   12/31/23 1400  metroNIDAZOLE (FLAGYL) tablet 1,000 mg  Status:  Discontinued        1,000 mg Oral  Once 12/30/23 1138 12/30/23 1832   12/31/23 1400  neomycin (MYCIFRADIN) tablet 1,000 mg  Status:  Discontinued        1,000 mg Oral  Once 12/30/23 1138  12/30/23 1832   12/31/23 0800  ertapenem (INVANZ) 1 g in sodium chloride 0.9 % 100 mL IVPB        1 g 200 mL/hr over 30 Minutes Intravenous  Once 12/30/23 1832 12/31/23 0858   12/30/23 2200  neomycin (MYCIFRADIN) tablet 1,000 mg  Status:  Discontinued        1,000 mg Oral  Once 12/30/23 0915 12/30/23 1138   12/30/23 2200  metroNIDAZOLE (FLAGYL) tablet 1,000 mg  Status:  Discontinued        1,000 mg Oral  Once 12/30/23 0915 12/30/23 1138   12/30/23 1500  neomycin (MYCIFRADIN) tablet 1,000 mg  Status:  Discontinued        1,000 mg  Oral  Once 12/30/23 0915 12/30/23 1138   12/30/23 1500  metroNIDAZOLE (FLAGYL) tablet 1,000 mg  Status:  Discontinued        1,000 mg Oral  Once 12/30/23 0915 12/30/23 1138   12/30/23 1400  neomycin (MYCIFRADIN) tablet 1,000 mg  Status:  Discontinued        1,000 mg Oral  Once 12/30/23 0915 12/30/23 1138   12/30/23 1400  metroNIDAZOLE (FLAGYL) tablet 1,000 mg  Status:  Discontinued        1,000 mg Oral  Once 12/30/23 0915 12/30/23 1138      Significant Hospital Events: Including procedures, antibiotic start and stop dates in addition to other pertinent events   02/28: Admitted to the medsurg unit with obstructing colon cancer  03/01: General Surgery consulted with recommendations to try a slow bowel prep over 2 days in anticipation of surgery.  However, following ingestion of a quarter of the full prep pt developed worsening abdominal distension.  KUB concerning for dilated loops of small bowel concerning for obstruction.  NGT placed by general surgery for decompression with plans to take pt to the OR on 03/2 for exploratory laparotomy and right colectomy. 03/02: Pt underwent exploratory laparotomy right hemicolectomy with primary anastomosis per general surgery  03/04: NGT removed pt had bowel movement and continued to pass flatus.  However, upon general surgery examination pt noted to be slightly distended.  Pathology report consistent with T3 N0 colon cancer all margins negative  03/05: Palliative Care consulted to address goals of treatments: decision to continue full scope of care  03/06: Rapid response initiated due to pt developing hypotension and atrial fibrillation with rvr.  Pt also cold/clammy with abdominal pain and worsening distention.  He received 1L IV fluid bolus and amiodarone gtt initiated, however he remained hypotensive requiring transfer to ICU.  Cardiology consulted.  CT Abd/Pelvis finding consistent with a leak.  Pt taken back to the OR for exploratory lap requiring  anastomotic resection; end ileostomy; and fistula. Postop pt remained mechanically intubated   Interim History / Subjective:  Pt sedated with propofol gtt received paralytic during procedure remains unresponsive.  Currently requiring levophed gtt @ 67mcg/min to maintain map 65 or higher.  Converted to NSR intraoperatively remains on amiodarone gtt   Objective   Blood pressure (!) 89/59, pulse 64, temperature (!) 97.4 F (36.3 C), temperature source Oral, resp. rate (!) 38, height 6\' 1"  (1.854 m), weight 69.2 kg, SpO2 97%.        Intake/Output Summary (Last 24 hours) at 01/04/2024 1355 Last data filed at 01/03/2024 1923 Gross per 24 hour  Intake --  Output 70 ml  Net -70 ml   Filed Weights   12/29/23 1418 12/30/23 0028  Weight: 70.8 kg 69.2 kg  Examination: General: Acutely-ill appearing male, NAD mechanically intubated  HENT: Supple, no JVD  Lungs: Faint rhonchi throughout, even, non labored  Cardiovascular: NSR, s1s2, no m/r/g, 1+ radial/1+ distal pulses, no edema  Abdomen: No audible BS, non distended, RLQ end ileostomy, LUQ mucous fistula, right-sided JP drain with serosanguinous output, midline abdominal incision.  See image below   Extremities: Normal bulk and tone Neuro: Sedated, RASS -5, PERRL GU: Indwelling foley catheter draining yellow urine   Resolved Hospital Problem list     Assessment & Plan:   #Acute toxic metabolic encephalopathy  #Postop pain  #Mechanical intubation pain/discomfort  Hx: Moderate lewy body dementia  - Correct metabolic derangements  - Avoid sedating medication as able  - Maintain RASS goal of -1 for now - PAD protocol to maintain RASS goal: propofol gtt and prn fentanyl for pain management  - WUA daily   #Circulatory shock  #New onset atrial fibrillation with rvr  #Mildly elevated troponin suspect secondary to demand ischemia  Hx: HTN, HLD, and hypercholesterolemia  - Continuous telemetry monitoring  - Trend troponin's until peaked   - Aggressive iv fluid resuscitation and prn levophed gtt to maintain map 65 or higher  - Resume heparin gtt once general surgery ok with resuming   #Mechanical intubation intraoperatively  - Full vent support for now: vent settings reviewed and established  - Continue lung protective strategies - Maintain plateau pressures less than 30 cm H2O - VAP bundle implemented  - Intermittent CXR's and ABG's  - SBT once all parameters met   #CKD stage II #Mild hyponatremia  #Metabolic acidosis  #Lactic acidosis  - Trend BMP, vbg and lactic acid  - Replace electrolytes as indicated  - Strict I&O's  - Avoid nephrotoxic agents as able   #Large bowel obstruction secondary to colon cancer cancer s/p hemicolectomy 03/2 #Anastomotic leak s/p ex-lap anastomotic resection, end ileostomy, and mucous fistula - General surgery consulted appreciate input  - NGT to LIS  - Keep NPO for now  - Per general surgery will initiate TPN on 03/07  #Sepsis  #Intraabdominal infection  - Trend WBC and monitor fever curve  - Trend PCT  - Continue zosyn for 5 day course per general surgery recommendations   #Chronic iron deficiency anemia  #Acute blood loss anemia secondary to surgery  - Trend CBC - Monitor for s/sx of bleeding  - Transfuse for hgb <8  #Type II diabetes mellitus  - CBG's q4hrs  - SSI  - Target CBG range 140 to 180  - Follow hyper/hypoglycemic protocol   Best Practice (right click and "Reselect all SmartList Selections" daily)   Diet/type: NPO; per General Surgery will start TPN on 01/05/24 DVT prophylaxis SCD Pressure ulcer(s): N/A GI prophylaxis: PPI Lines: Central line Foley:  Yes, and it is still needed Code Status:  full code Last date of multidisciplinary goals of care discussion [N/A]  Labs   CBC: Recent Labs  Lab 12/31/23 0732 12/31/23 0738 01/01/24 0440 01/02/24 0422 01/03/24 0436 01/04/24 0430  WBC 3.2*  --  12.0* 11.2* 9.5 4.2  NEUTROABS 1.8  --   --   --    --   --   HGB 9.3* 9.2* 9.4* 8.1* 8.9* 10.5*  HCT 28.5* 27.0* 27.9* 24.8* 27.2* 31.3*  MCV 86.6  --  84.5 85.5 84.7 83.2  PLT 240  --  295 279 284 374    Basic Metabolic Panel: Recent Labs  Lab 12/30/23 0607 12/31/23 0732 12/31/23 0738 01/01/24 0440 01/02/24  0422 01/03/24 0436 01/04/24 0430  NA 134* 134* 135 137 140 136 134*  K 2.9* 3.7 3.7 4.2 4.2 4.1 4.5  CL 100 101 98 106 106 103 98  CO2 26 25  --  23 25 27 24   GLUCOSE 114* 103* 97 136* 119* 126* 169*  BUN 25* 16 13 24* 23 14 25*  CREATININE 0.75 0.72 0.80 0.87 0.69 0.66 0.89  CALCIUM 7.9* 7.9*  --  7.6* 7.8* 7.9* 8.2*  MG 1.8 1.9  --  2.0 2.2  --  2.0  PHOS 2.5 2.4*  --  2.6 2.6  --  4.1   GFR: Estimated Creatinine Clearance: 68 mL/min (by C-G formula based on SCr of 0.89 mg/dL). Recent Labs  Lab 01/01/24 0440 01/02/24 0422 01/03/24 0436 01/04/24 0430 01/04/24 0857  WBC 12.0* 11.2* 9.5 4.2  --   LATICACIDVEN  --   --   --   --  3.5*    Liver Function Tests: Recent Labs  Lab 12/29/23 1420 01/01/24 0440  AST 17  --   ALT 15  --   ALKPHOS 91  --   BILITOT 1.1  --   PROT 6.7  --   ALBUMIN 3.5 2.3*   Recent Labs  Lab 12/29/23 1420  LIPASE 20   No results for input(s): "AMMONIA" in the last 168 hours.  ABG    Component Value Date/Time   TCO2 23 12/31/2023 0738     Coagulation Profile: No results for input(s): "INR", "PROTIME" in the last 168 hours.  Cardiac Enzymes: No results for input(s): "CKTOTAL", "CKMB", "CKMBINDEX", "TROPONINI" in the last 168 hours.  HbA1C: Hgb A1c MFr Bld  Date/Time Value Ref Range Status  12/30/2023 06:07 AM 6.9 (H) 4.8 - 5.6 % Final    Comment:    (NOTE) Pre diabetes:          5.7%-6.4%  Diabetes:              >6.4%  Glycemic control for   <7.0% adults with diabetes   06/15/2023 01:48 PM 7.6 (H) 4.8 - 5.6 % Final    Comment:    (NOTE)         Prediabetes: 5.7 - 6.4         Diabetes: >6.4         Glycemic control for adults with diabetes: <7.0      CBG: Recent Labs  Lab 01/03/24 1732 01/03/24 2146 01/04/24 0844 01/04/24 0923 01/04/24 1144  GLUCAP 186* 127* 148* 146* 173*    Review of Systems:   Unable to assess pt mechanically intubated   Past Medical History:  He,  has a past medical history of Anemia, Atherosclerosis of abdominal aorta (HCC), Blind right eye, Colon cancer (HCC), Dementia (HCC), 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   Surgical History:   Past Surgical History:  Procedure Laterality Date   AMPUTATION TOE Right 03/01/2020   Procedure: AMPUTATION TOE;  Surgeon: [name], DPM;  Location: [facility] ORS;  Service: Podiatry;  Laterality: Right;   AMPUTATION TOE Right 01/03/2023   Procedure: AMPUTATION TOE, 2nd right;  Surgeon: [name], DPM;  Location: [facility];  Service: Podiatry;  Laterality: Right;   APPENDECTOMY  BIOPSY  12/20/2023   Procedure: BIOPSY;  Surgeon: Norma Fredrickson, Boykin Nearing, MD;  Location: [facility] ENDOSCOPY;  Service: Gastroenterology;;   BONE EXCISION Right 01/03/2023   Procedure: Exostectomy interphalangeal joint right great toe;  Surgeon: [name], DPM;  Location: [facility] ORS;  Service: Podiatry;  Laterality: Right;   CATARACT EXTRACTION W/PHACO Left 09/06/2021   Procedure: CATARACT EXTRACTION PHACO AND INTRAOCULAR LENS PLACEMENT (IOC) LEFT 2.71 00:30.6;  Surgeon: Nevada Crane, MD;  Location: [facility];  Service: Ophthalmology;  Laterality: Left;  Diabetic   COLONOSCOPY WITH PROPOFOL N/A 12/20/2023   Procedure: COLONOSCOPY WITH PROPOFOL;  Surgeon: Toledo, Boykin Nearing, MD;  Location: [facility] ENDOSCOPY;  Service:  Gastroenterology;  Laterality: N/A;   ESOPHAGOGASTRODUODENOSCOPY (EGD) WITH PROPOFOL N/A 12/20/2023   Procedure: ESOPHAGOGASTRODUODENOSCOPY (EGD) WITH PROPOFOL;  Surgeon: Toledo, Boykin Nearing, MD;  Location: [facility] ENDOSCOPY;  Service: Gastroenterology;  Laterality: N/A;   EYE SURGERY     age 78   HERNIA REPAIR     INSERTION OF MESH  06/23/2023   Procedure: INSERTION OF MESH;  Surgeon: Sung Amabile, DO;  Location: [facility] OR[facility];  Service: General;;   KIDNEY STONE SURGERY     LAPAROTOMY Right 12/31/2023   Procedure: EXPLORATORY LAPAROTOMY WITH RIGHT COLECTOMY;  Surgeon: Kandis Cocking, MD;  Location: [facility];  Service: General;  Laterality: Right;   REMOVAL RETAINED LENS Right 04/19/2021   Procedure: CATARACT EXTRACTION PHACO,  RIGHT VISION BLUE 0.20 00:01.4 case aborted;  Surgeon: Nevada Crane, MD;  Location: [facility];  Service: Ophthalmology;  Laterality: Right;  Diabetic - oral meds   SHOULDER ARTHROSCOPY WITH BICEPS TENDON REPAIR Right 10/06/2016   Procedure: SHOULDER ARTHROSCOPY WITH BICEPS TENDON REPAIR;  Surgeon: Christena Flake, MD;  Location: [facility] OR[facility];  Service: Orthopedics;  Laterality: Right;   SHOULDER ARTHROSCOPY WITH OPEN ROTATOR CUFF REPAIR Right 10/06/2016   Procedure: SHOULDER ARTHROSCOPY WITH OPEN ROTATOR CUFF REPAIR;  Surgeon: Christena Flake, MD;  Location: [facility];  Service: Orthopedics;  Laterality: Right;   SHOULDER ARTHROSCOPY WITH SUBACROMIAL DECOMPRESSION Right 10/06/2016   Procedure: SHOULDER ARTHROSCOPY WITH SUBACROMIAL DECOMPRESSION AND DEBRIDEMENT;  Surgeon: Christena Flake, MD;  Location: [facility];  Service: Orthopedics;  Laterality: Right;   SUBMUCOSAL TATTOO INJECTION  12/20/2023   Procedure: SUBMUCOSAL TATTOO INJECTION;  Surgeon: Toledo, Boykin Nearing, MD;  Location: [facility] ENDOSCOPY;  Service: Gastroenterology;;   TONSILLECTOMY       Social History:   reports that he quit smoking about 54 years ago. His smoking use included cigarettes. He started smoking about 64  years ago. He has a 2.5 pack-year smoking history. He has been exposed to tobacco smoke. He has quit using smokeless tobacco. He reports that he does not drink alcohol and does not use drugs.   Family History:  His Family history is unknown by patient.   Allergies No Known Allergies   Home Medications  Prior to Admission medications   Medication Sig Start Date End Date Taking? Authorizing Provider  aspirin 81 MG tablet Take 81 mg by mouth daily.   Yes [provider]  atorvastatin (LIPITOR) 80 MG tablet Take 80 mg by mouth daily.   Yes [provider]  Cholecalciferol (VITAMIN D3) 50 MCG (2000 UT) TABS Take 1 tablet by mouth daily.   Yes [provider]  cyanocobalamin (VITAMIN B12) 1000 MCG tablet Take 1,000 mcg by mouth daily.   Yes [provider]  ferrous sulfate 325 (65 FE) MG tablet Take 325 mg by mouth 2 (two)  times daily with a meal.   Yes [provider]  glipiZIDE (GLUCOTROL) 5 MG tablet Take 5 mg by mouth daily.   Yes [provider]  losartan-hydrochlorothiazide (HYZAAR) 100-12.5 MG tablet Take 1 tablet by mouth daily.   Yes [provider]  metFORMIN (GLUCOPHAGE) 500 MG tablet Take 500 mg by mouth 2 (two) times daily with a meal.   Yes [provider]  pioglitazone (ACTOS) 15 MG tablet Take 15 mg by mouth daily.   Yes [provider]  rivastigmine (EXELON) 3 MG capsule Take 3 mg by mouth 2 (two) times daily.   Yes [provider]     Critical care time: 60 minutes      Zada Girt, AGNP  Pulmonary/Critical Care Pager [phone] (please enter 7 digits) PCCM Consult Pager [phone] (please enter 7 digits)

## 2024-01-04 NOTE — Progress Notes (Addendum)
 Initial Nutrition Assessment  DOCUMENTATION CODES:   Severe malnutrition in context of chronic illness  INTERVENTION:   Recommend TPN initiation once patient is more stable and if pt remains NPO  Recommend thiamine 100mg  IV daily   Pt at high refeed risk; recommend monitor potassium, magnesium and phosphorus labs daily until stable  Daily weights   RD will add supplements with diet advancement   NUTRITION DIAGNOSIS:   Severe Malnutrition related to chronic illness as evidenced by severe fat depletion, severe muscle depletion.  GOAL:   Patient will meet greater than or equal to 90% of their needs  MONITOR:   Labs, Weight trends, Diet advancement, Skin, I & O's  REASON FOR ASSESSMENT:   NPO/Clear Liquid Diet    ASSESSMENT:   78 y/o male with h/o HTN, HLD, DM, dementia, diverticulitis, GERD, HLD, DJD and inguinal hernia s/p repair who is admitted with LBO secondary to colon mass (Stage IIa colon adenocarcinoma) s/p exploratory laparotomy with right hemicolectomy with primary anastomosis 3/2 complicated by post op ileus and new onset atrial fibrillation with RVR.  Visited pt's room today. Pt sleeping after recently receiving dilaudid. Pt has remained on NPO/clear liquid diet since admission and is now without adequate nutrition for 7 days. Rapid response called this morning for tachycardia and hypotension; pt transferred to the CCU. Lactic acid elevated. Pt currently NPO with abdominal CT scan pending. Pt is too unstable today for nutrition support. Recommend TPN initiation once pt is more stable and if he is to remain NPO. RD will add supplements if patient's diet is able to be advanced. Pt is at high refeed risk. Per chart, pt appears weight stable at baseline. Pt is down ~4lbs since admission but has not been weighed since 3/1; will request daily weights.  Medications reviewed and include: insulin, MVI, protonix, heparin, LRS @75ml /hr   Labs reviewed: Na 134(L), K 4.5 wnl,  BUN 25(H), P 4.1 wnl, Mg 2.0 wnl Hgb 10.5(L), Hct 31.3(L) Cbgs- 173, 146, 148 x 24 hrs  AIC 6.9(H)- 3/1  NUTRITION - FOCUSED PHYSICAL EXAM:  Flowsheet Row Most Recent Value  Orbital Region Severe depletion  Upper Arm Region Severe depletion  Thoracic and Lumbar Region Severe depletion  Buccal Region Severe depletion  Temple Region Severe depletion  Clavicle Bone Region Severe depletion  Clavicle and Acromion Bone Region Severe depletion  Scapular Bone Region Severe depletion  Dorsal Hand Severe depletion  Patellar Region Severe depletion  Anterior Thigh Region Severe depletion  Posterior Calf Region Severe depletion  Edema (RD Assessment) None  Hair Reviewed  Eyes Reviewed  Mouth Reviewed  Skin Reviewed  Nails Reviewed   Diet Order:   Diet Order             Diet NPO time specified Except for: Ice Chips, Sips with Meds  Diet effective now                  EDUCATION NEEDS:   No education needs have been identified at this time  Skin:  Skin Assessment: Reviewed RN Assessment (incision abdomen)  Last BM:  3/6- type 6  Height:   Ht Readings from Last 1 Encounters:  12/30/23 6\' 1"  (1.854 m)    Weight:   Wt Readings from Last 1 Encounters:  12/30/23 69.2 kg    Ideal Body Weight:  83.6 kg  BMI:  Body mass index is 20.13 kg/m.  Estimated Nutritional Needs:   Kcal:  2000-2300kcal/day  Protein:  100-115g/day  Fluid:  1.8-2.1L/day  Betsey Holiday MS, RD, LDN If unable to be reached, please send secure chat to "RD inpatient" available from 8:00a-4:00p daily

## 2024-01-04 NOTE — Anesthesia Preprocedure Evaluation (Addendum)
 Anesthesia Evaluation  Patient identified by MRN, date of birth, ID bandGeneral Assessment Comment:Patient alert but not cooperative.  In Afib with RVR, hypotensive. Central line about to be placed by surgeon. Concern for bowel leak.  Reviewed: Allergy & Precautions, NPO status , Patient's Chart, lab work & pertinent test results  History of Anesthesia Complications Negative for: history of anesthetic complications  Airway Mallampati: Unable to assess  TM Distance: <3 FB Neck ROM: full    Dental  (+) Missing   Pulmonary neg shortness of breath, neg sleep apnea, neg COPD, Patient abstained from smoking.Not current smoker, former smoker          Cardiovascular Exercise Tolerance: Good METShypertension, Pt. on medications (-) angina (-) CAD and (-) Past MI + dysrhythmias Atrial Fibrillation  Rhythm:Irregular Rate:Tachycardia     Neuro/Psych  PSYCHIATRIC DISORDERS     Dementia negative neurological ROS     GI/Hepatic Neg liver ROS,GERD  Controlled,,  Endo/Other  diabetes, Type 2    Renal/GU Renal diseasenegative Renal ROS     Musculoskeletal   Abdominal   Peds  Hematology  (+) Blood dyscrasia, anemia   Anesthesia Other Findings Past Medical History: No date: Anemia No date: Atherosclerosis of abdominal aorta (HCC) 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 No date: Colon cancer (HCC) No date: Dementia (HCC) No date: 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  Only wears upper  Past Surgical History: 03/01/2020: AMPUTATION TOE; Right     Comment:  Procedure: AMPUTATION TOE;  Surgeon: [name],               DPM;  Location: [facility];  Service: Podiatry;                Laterality: Right; 01/03/2023: AMPUTATION TOE; Right     Comment:  Procedure: AMPUTATION TOE, 2nd right;  Surgeon: [name], DPM;  Location: [facility];  Service: Podiatry;                Laterality: Right; No date: APPENDECTOMY 12/20/2023: BIOPSY     Comment:  Procedure: BIOPSY;  Surgeon: Norma Fredrickson, Boykin Nearing, MD;                Location: [facility];  Service: Gastroenterology;; 01/03/2023: BONE EXCISION; Right     Comment:  Procedure: Exostectomy interphalangeal joint right great              toe;  Surgeon: [name], DPM;  Location: [facility] OR[facility];               Service: Podiatry;  Laterality: Right; 09/06/2021: CATARACT EXTRACTION W/PHACO; Left     Comment:  Procedure: CATARACT EXTRACTION PHACO AND INTRAOCULAR               LENS PLACEMENT (IOC) LEFT  2.71 00:30.6;  Surgeon: Nevada Crane, MD;  Location: [facility];                Service: Ophthalmology;  Laterality: Left;  Diabetic 12/20/2023: COLONOSCOPY WITH PROPOFOL; N/A     Comment:  Procedure: COLONOSCOPY WITH PROPOFOL;  Surgeon: Toledo,               Boykin Nearing, MD;  Location: [facility] ENDOSCOPY;  Service:               Gastroenterology;  Laterality: N/A; 12/20/2023: ESOPHAGOGASTRODUODENOSCOPY (EGD) WITH PROPOFOL; N/A     Comment:  Procedure: ESOPHAGOGASTRODUODENOSCOPY (EGD) WITH               PROPOFOL;  Surgeon: Toledo, Boykin Nearing, MD;  Location:               [facility] ENDOSCOPY;  Service: Gastroenterology;  Laterality:               N/A; No date: EYE SURGERY     Comment:  age 78 No date: HERNIA REPAIR 06/23/2023: INSERTION OF MESH     Comment:  Procedure: INSERTION OF  MESH;  Surgeon: Sung Amabile,               DO;  Location: [facility] OR[facility];  Service: General;; No date: KIDNEY STONE SURGERY 04/19/2021: REMOVAL RETAINED LENS; Right     Comment:  Procedure: CATARACT EXTRACTION PHACO,  RIGHT VISION BLUE              0.20 00:01.4 case aborted;  Surgeon: Nevada Crane,               MD;  Location: [facility];  Service:               Ophthalmology;  Laterality: Right;  Diabetic - oral meds 10/06/2016: SHOULDER ARTHROSCOPY WITH BICEPS TENDON REPAIR; Right     Comment:  Procedure: SHOULDER ARTHROSCOPY WITH BICEPS TENDON               REPAIR;  Surgeon: Christena Flake, MD;  Location: [facility] OR[facility];               Service: Orthopedics;  Laterality: Right; 10/06/2016: SHOULDER ARTHROSCOPY WITH OPEN ROTATOR CUFF REPAIR; Right     Comment:  Procedure: SHOULDER ARTHROSCOPY WITH OPEN ROTATOR CUFF               REPAIR;  Surgeon: Christena Flake, MD;  Location: [facility];               Service: Orthopedics;  Laterality: Right; 10/06/2016: SHOULDER ARTHROSCOPY WITH SUBACROMIAL DECOMPRESSION; Right     Comment:  Procedure: SHOULDER ARTHROSCOPY WITH SUBACROMIAL               DECOMPRESSION AND DEBRIDEMENT;  Surgeon: Christena Flake,               MD;  Location: [facility];  Service: Orthopedics;                Laterality: Right; 12/20/2023: SUBMUCOSAL TATTOO INJECTION     Comment:  Procedure: SUBMUCOSAL TATTOO INJECTION;  Surgeon:               Toledo, Boykin Nearing, MD;  Location: [facility] ENDOSCOPY;                Service: Gastroenterology;; No date: TONSILLECTOMY  BMI  Body Mass Index: 20.13 kg/m      Reproductive/Obstetrics negative OB ROS                             Anesthesia Physical Anesthesia Plan  ASA: 5 and emergent  Anesthesia Plan: General   Post-op Pain Management: Ofirmev IV (intra-op)*   Induction: Intravenous and Rapid sequence  PONV Risk Score and Plan: 4 or greater and Ondansetron, Dexamethasone and Treatment may vary due to age  or medical condition  Airway Management Planned: Oral ETT and Video Laryngoscope Planned  Additional Equipment: Arterial line and CVP  Intra-op Plan:   Post-operative Plan: Post-operative intubation/ventilation  Informed Consent: I have reviewed the patients History and Physical, chart, labs and discussed the procedure including the risks, benefits and alternatives for the proposed anesthesia with the patient or authorized representative who has indicated his/her understanding and acceptance.     Dental Advisory Given and Consent reviewed with POA  Plan Discussed with: Anesthesiologist, CRNA and Surgeon  Anesthesia Plan Comments: (Discussed with surgeon about preop NG tube , however surgeon stated he has tried multiple times unsuccessfully mostly do to patient fighting and not being cooperative. We will have to deal with these risks and for patient/physician safety, place NG tube after intubation.   Discussed risks of anesthesia with patient's wife and son (due to patient's dementia), including PONV, sore throat, lip/dental/eye damage. Rare risks discussed as well, such as cardiorespiratory and neurological sequelae, and allergic reactions. counseled on being higher risk for anesthesia due to comorbidities: acute critical illness, bowel perforation, afib with RVR. They was told about increased risk of cardiac and respiratory events, including death. I discussed patient's wishes in event of cardiac arrest, and the wife believes he would want to be full code. Discussed arterial line placement, as well as likelihood of post operative prolonged intubation. Plan for RSI Discussed the role of CRNA in patient's perioperative care. Family understands.)       Anesthesia Quick Evaluation

## 2024-01-04 NOTE — Anesthesia Procedure Notes (Addendum)
 Procedure Name: Intubation Date/Time: 01/04/2024 2:52 PM  Performed by: Darrell Jewel I, CRNAPre-anesthesia Checklist: Patient identified, Patient being monitored, Timeout performed, Emergency Drugs available and Suction available Patient Re-evaluated:Patient Re-evaluated prior to induction Oxygen Delivery Method: Circle system utilized Preoxygenation: Pre-oxygenation with 100% oxygen Induction Type: IV induction and Rapid sequence Laryngoscope Size: McGrath and 4 Grade View: Grade I Tube type: Oral Tube size: 7.5 mm Number of attempts: 1 Airway Equipment and Method: Stylet Placement Confirmation: ETT inserted through vocal cords under direct vision, positive ETCO2 and breath sounds checked- equal and bilateral Secured at: 21 cm Tube secured with: Tape Dental Injury: Teeth and Oropharynx as per pre-operative assessment

## 2024-01-04 NOTE — Progress Notes (Signed)
 Progress Note   Patient: Alex ANSELMI Sr. VWU:[id number] DOB: Apr 10, 1946 DOA: 12/29/2023     6 DOS: the patient was seen and examined on 01/04/2024   Brief hospital course: Amiel Mccaffrey. is a 78 y.o. male with medical history significant for DM, HTN, Lewy body dementia, recently diagnosed adenocarcinoma of the colon on colonoscopy 12/20/2023 that was done for iron deficiency anemia and abdominal pain.  She saw her oncologist on 12/27/2023 to discuss therapy for newly diagnosed colon cancer.  He presented to the hospital with nausea, vomiting and worsening right lower quadrant abdominal pain.  He was found to have large bowel obstruction. S/p ex laparotomy, right hemicolectomy with primary anastomosis on 12/31/2023.  Pathology came back with colon adenocarcinoma.  Patient had a large bowel movement on 3/4, but still has significant abdominal pain and distention.   Patient developed significant hypotension on 3/6, received a liter of fluid bolus.  He also developed atrial fibrillation with RVR, started on amnio drip, given digoxin.  Cardiology consult obtained.   Principal Problem:   Large bowel obstruction (HCC) Active Problems:   HTN (hypertension), benign   Type 2 diabetes mellitus (HCC)   Malignant neoplasm of transverse colon (HCC)   Dementia with behavioral disturbance (HCC)   Electrolyte abnormality   Colon adenocarcinoma (HCC)   Palliative care encounter   Hypotension   Paroxysmal atrial fibrillation with RVR (HCC)   Assessment and Plan:  Hypotension. New onset atrial fibrillation with RVR. Elevated troponin secondary to atrial fibrillation with RVR. Lactic acidosis secondary to hypotension. Rapid response was called earlier this morning, patient developed significant hypotension, tachycardia.  Stat EKG showed atrial fibrillation with tachycardia, heart rate was up to 180-200.  Patient received a liter of fluid bolus, blood pressure was stabilized.  Patient was given 0.5 mg  of digoxin.  Also started on amnio drip and a bolus.  Patient was transferred to ICU stepdown unit.  Urgent cardiology consult is obtained. Patient condition is critical, family updated.  Will follow patient closely in the ICU. Patient also has elevated troponin, which is mild, this is secondary to atrial fibrillation.  No suspicion of myocardial infarction.  Reviewed EKG, no ST elevation. Patient also has lactic acidosis, this appears to be secondary to hypotension.  No infection suspected.  Large bowel obstruction secondary to colon cancer.  Status post right hemicolectomy. Stage IIa colon adenocarcinoma. Postop ileus. Patient had large bowel movement 3/4.  But still complaining of abdominal distention and pain.  Due to sudden change patient's status, patient was placed back on n.p.o. general surgery.  CT scan is obtained. I will start gentle rehydration.   Acute blood loss anemia secondary surgery. Chronic iron deficient anemia. Continue to follow.   Hypokalemia Hyponatremia. Condition improved   Type 2 diabetes Continue sliding scale insulin, back to every 4 hours as needed.   Essential hypertension. Pressures are low, continue to follow.   Lewy body dementia. Follow-up with neurology as outpatient.      Subjective:  Patient had a sudden onset of hypotension, atrial fibrillation.  He does not feel short of breath.  Physical Exam: Vitals:   01/04/24 0852 01/04/24 0856 01/04/24 0904 01/04/24 0920  BP: (!) 98/50 (!) 90/40 (!) 80/40 (!) 99/57  Pulse:      Resp:  (!) 28  (!) 28  Temp:    (!) 97.4 F (36.3 C)  TempSrc:    Oral  SpO2:      Weight:  Height:       General exam: Pale appearing, no acute distress. Respiratory system: Few fine crackles in the base. Respiratory effort normal. Cardiovascular system: Irregularly irregular and tachycardic. No JVD, murmurs, rubs, gallops or clicks. No pedal edema. Gastrointestinal system: Abdomen is nondistended, soft and  nontender. No organomegaly or masses felt. Normal bowel sounds heard. Central nervous system: Alert and oriented x2. No focal neurological deficits. Extremities: Symmetric 5 x 5 power. Skin: No rashes, lesions or ulcers Psychiatry: Mood & affect appropriate.    Data Reviewed:  Reviewed the EKG image, lab results.  Family Communication: Son and wife updated separately.  Disposition: Status is: Inpatient Remains inpatient appropriate because: Severity of disease, IV treatment, high risk of deterioration.     CRITICAL CARE Performed by: Marrion Coy   Total critical care time: 60 minutes  Critical care time was exclusive of separately billable procedures and treating other patients.  Critical care was necessary to treat or prevent imminent or life-threatening deterioration.  Critical care was time spent personally by me on the following activities: development of treatment plan with patient and/or surrogate as well as nursing, discussions with consultants, evaluation of patient's response to treatment, examination of patient, obtaining history from patient or surrogate, ordering and performing treatments and interventions, ordering and review of laboratory studies, ordering and review of radiographic studies, pulse oximetry and re-evaluation of patient's condition.   Author: Marrion Coy, MD 01/04/2024 9:41 AM  For on call review [email or web address].

## 2024-01-04 NOTE — Significant Event (Signed)
 Responded to overhead page of rapid response. On arrival, MD, pt RN, Rapid Response RN present. Pt maintaining airway has pulse. Reported decreased bp and increased HR.

## 2024-01-04 NOTE — Progress Notes (Signed)
 Over this morning patient had worsening tachycardia and hypotension. I presente to bedside and patient with worsening abdominal distention. STAT CT ordered and consistent with leak. I have discussed these findings with the family and the need to retrun to OR. I also discussed need for central acess given his hypotension. They understand these risks of infection, bleeding, damage to colon, need for keeping patient open, need for keeping patient intubated. We will plan for CVC placement as well as ex-lap, possible anastamotic resection, need for ileostomy, need for intubation.

## 2024-01-04 NOTE — Anesthesia Procedure Notes (Deleted)
 Procedure Name: Intubation Date/Time: 01/04/2024 3:18 PM  Performed by: Darrell Jewel I, CRNAPre-anesthesia Checklist: Patient identified, Patient being monitored, Timeout performed, Emergency Drugs available and Suction available Patient Re-evaluated:Patient Re-evaluated prior to induction Oxygen Delivery Method: Circle system utilized Preoxygenation: Pre-oxygenation with 100% oxygen Induction Type: IV induction and Rapid sequence Laryngoscope Size: 3 and McGrath Grade View: Grade I Tube type: Oral Tube size: 7.5 mm Number of attempts: 1 Airway Equipment and Method: Stylet Placement Confirmation: ETT inserted through vocal cords under direct vision, positive ETCO2 and breath sounds checked- equal and bilateral Secured at: 21 cm Tube secured with: Tape Dental Injury: Teeth and Oropharynx as per pre-operative assessment

## 2024-01-04 NOTE — Anesthesia Procedure Notes (Signed)
 Arterial Line Insertion Start/End3/03/2024 2:55 PM, 01/04/2024 2:58 PM Performed by: anesthesiologist  Preanesthetic checklist: patient identified, IV checked, site marked, risks and benefits discussed, surgical consent, monitors and equipment checked, pre-op evaluation and timeout performed radial was placed  Attempts: 1 Procedure performed using ultrasound guided technique. Ultrasound Notes:anatomy identified, needle tip was noted to be adjacent to the nerve/plexus identified and no ultrasound evidence of intravascular and/or intraneural injection Following insertion, dressing applied.

## 2024-01-04 NOTE — Plan of Care (Signed)
  Problem: Fluid Volume: Goal: Ability to maintain a balanced intake and output will improve Outcome: Progressing   Problem: Coping: Goal: Level of anxiety will decrease Outcome: Progressing

## 2024-01-04 NOTE — Progress Notes (Signed)
   01/04/24 1400  Spiritual Encounters  Type of Visit Follow up  Care provided to: Pt and family  Conversation partners present during encounter Physician;Nurse  Reason for visit Advance directives  OnCall Visit Yes   Chaplain received call from the Unit patient was on before saying the patient's son wanted to create an AD but patient is not in a position to understand what he's signing.  Family will request Chaplain when patient is feeling better.  Chaplain offered prayer at the request of the family and will follow up as family and staff request a Chaplain visit.  Rev. Rana M. Earlene Plater, MDiv Chaplain Resident [facility]

## 2024-01-04 NOTE — Progress Notes (Signed)
 PHARMACY - ANTICOAGULATION CONSULT NOTE  Pharmacy Consult for heparin infusion  Indication: atrial fibrillation  No Known Allergies  Patient Measurements: Height: 6\' 1"  (185.4 cm) Weight: 69.2 kg (152 lb 8.9 oz) IBW/kg (Calculated) : 79.9 Heparin Dosing Weight: 69.2 kg  Vital Signs: Temp: 97.4 F (36.3 C) (03/06 0920) Temp Source: Oral (03/06 0920) BP: 99/57 (03/06 0920) Pulse Rate: 88 (03/06 0751)  Labs: Recent Labs    01/02/24 0422 01/03/24 0436 01/04/24 0430 01/04/24 0857  HGB 8.1* 8.9* 10.5*  --   HCT 24.8* 27.2* 31.3*  --   PLT 279 284 374  --   CREATININE 0.69 0.66 0.89  --   TROPONINIHS  --   --   --  27*    Estimated Creatinine Clearance: 68 mL/min (by C-G formula based on SCr of 0.89 mg/dL).   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  Only wears upper    Medications:  Scheduled:   digoxin       feeding supplement  1 Container Oral TID BM   insulin aspart  0-9 Units Subcutaneous TID WC   multivitamin with minerals  1 tablet Oral Daily   pantoprazole  40 mg Oral BID   rivastigmine  3 mg Oral BID    Assessment: 78 y.o. male with medical history significant for DM, HTN, Lewy body dementia, recently diagnosed adenocarcinoma of the colon. He is on no chronic anticoagulation PTA   Baseline Labs:  H&H, PLT stable, INR/aPTT pending  Goal of Therapy:  Heparin level 0.3-0.7 units/ml Monitor platelets by anticoagulation protocol: Yes   Plan:  Give 4000 units bolus x 1 Start heparin infusion at 1100 units/hr Check anti-Xa level in 8 hours and daily while on heparin Continue to monitor H&H and platelets  Lowella Bandy 01/04/2024,10:03 AM

## 2024-01-04 NOTE — Consult Note (Signed)
 Pharmacy Antibiotic Note  Alex Soto. is a 78 y.o. male admitted on 12/29/2023 with septic shock due to perforated colon/peritonitis for which he is currently on Zosyn. Pharmacy has been consulted for fluconazole dosing.  Today, 01/04/2024 Day 1 of fluconazole WBC 4.2  Afebrile  SCr 0.89 with estimated CrCl of 68 mL/min  CT abdomen pelvis Findings consistent with anastomotic leak at the level of the right-sided ileocolic anastomosis located just along the inferior aspect of the gallbladder Plan: Give fluconazole 800 mg IV x 1 dose today, followed by flluconazole 400 mg IV Q24H starting tomorrow  Also receiving Zosyn 3.375 gm IV Q8H Pharmacy will continue to monitor and dose adjust appropriately   Height: 6\' 1"  (185.4 cm) Weight: 69.2 kg (152 lb 8.9 oz) IBW/kg (Calculated) : 79.9  Temp (24hrs), Avg:98.4 F (36.9 C), Min:97.4 F (36.3 C), Max:99.9 F (37.7 C)  Recent Labs  Lab 12/31/23 0732 12/31/23 0738 01/01/24 0440 01/02/24 0422 01/03/24 0436 01/04/24 0430 01/04/24 0857  WBC 3.2*  --  12.0* 11.2* 9.5 4.2  --   CREATININE 0.72 0.80 0.87 0.69 0.66 0.89  --   LATICACIDVEN  --   --   --   --   --   --  3.5*    Estimated Creatinine Clearance: 68 mL/min (by C-G formula based on SCr of 0.89 mg/dL).    No Known Allergies  Antimicrobials this admission: 03/02 ertapenem >> 03/03 03/06 Zosyn >>  03/06 fluconazole >>  Dose adjustments this admission:  Microbiology results:  Thank you for allowing pharmacy to be a part of this patient's care.  Littie Deeds, PharmD Pharmacy Resident  01/04/2024 6:36 PM

## 2024-01-04 NOTE — Transfer of Care (Signed)
 Immediate Anesthesia Transfer of Care Note  Patient: Alex BIALAS Sr.  Procedure(s) Performed: LAPAROTOMY, EXPLORATORY RESECTION OF ANASTOMOSIS, TAKE DOWN SPLENIC FLEXTURE CREATION, END ILEOSTOMY AND MUCOUS FISTULA  Patient Location: ICU  Anesthesia Type:General  Level of Consciousness: sedated  Airway & Oxygen Therapy: Patient remains intubated per anesthesia plan  Post-op Assessment: Report given to RN and Post -op Vital signs reviewed and stable  Post vital signs: Reviewed and stable see nursing flowsheet  Last Vitals:  Vitals Value Taken Time  BP    Temp    Pulse    Resp    SpO2      Last Pain:  Vitals:   01/04/24 1145  TempSrc:   PainSc: Asleep         Complications: No notable events documented.

## 2024-01-04 NOTE — Progress Notes (Signed)
 MEWS Progress Note  Patient Details Name: PENNY FRISBIE Sr. MRN: [id number] DOB: 1946-03-30 Today's Date: 01/04/2024   MEWS Flowsheet Documentation:  Assess: MEWS Score Temp: (!) 97.4 F (36.3 C) BP: (!) 89/59 MAP (mmHg): 69 Pulse Rate: 64 ECG Heart Rate: (!) 165 Resp: (!) 38 Level of Consciousness: Alert SpO2: 97 % O2 Device: Nasal Cannula Patient Activity (if Appropriate): In bed O2 Flow Rate (L/min): 2 L/min Assess: MEWS Score MEWS Temp: 0 MEWS Systolic: 1 MEWS Pulse: 3 MEWS RR: 3 MEWS LOC: 0 MEWS Score: 7 MEWS Score Color: Red Assess: SIRS CRITERIA SIRS Temperature : 0 SIRS Respirations : 1 SIRS Pulse: 1 SIRS WBC: 0 SIRS Score Sum : 2       Pt reassessed by RN, MEWS RED, rapid response called. See notes.    Adair Laundry 01/04/2024, 11:37 AM

## 2024-01-05 ENCOUNTER — Encounter: Payer: Self-pay | Admitting: General Surgery

## 2024-01-05 ENCOUNTER — Inpatient Hospital Stay
Admit: 2024-01-05 | Discharge: 2024-01-05 | Disposition: A | Discharge status: Home / Self Care | Attending: Student | Admitting: Student

## 2024-01-05 ENCOUNTER — Inpatient Hospital Stay: Payer: Medicare PPO

## 2024-01-05 DIAGNOSIS — C184 Malignant neoplasm of transverse colon: Secondary | ICD-10-CM

## 2024-01-05 DIAGNOSIS — A419 Sepsis, unspecified organism: Secondary | ICD-10-CM | POA: Diagnosis not present

## 2024-01-05 DIAGNOSIS — R6521 Severe sepsis with septic shock: Secondary | ICD-10-CM | POA: Diagnosis not present

## 2024-01-05 DIAGNOSIS — G928 Other toxic encephalopathy: Secondary | ICD-10-CM | POA: Diagnosis not present

## 2024-01-05 DIAGNOSIS — R7989 Other specified abnormal findings of blood chemistry: Secondary | ICD-10-CM | POA: Diagnosis not present

## 2024-01-05 DIAGNOSIS — E43 Unspecified severe protein-calorie malnutrition: Secondary | ICD-10-CM

## 2024-01-05 DIAGNOSIS — E872 Acidosis, unspecified: Secondary | ICD-10-CM

## 2024-01-05 LAB — BLOOD GAS, ARTERIAL
Acid-base deficit: 2.3 mmol/L — ABNORMAL HIGH (ref 0.0–2.0)
Bicarbonate: 21.2 mmol/L (ref 20.0–28.0)
FIO2: 40 %
MECHVT: 500 mL
Mechanical Rate: 24
O2 Saturation: 99.6 %
PEEP: 5 cmH2O
Patient temperature: 37
pCO2 arterial: 32 mmHg (ref 32–48)
pH, Arterial: 7.43 (ref 7.35–7.45)
pO2, Arterial: 135 mmHg — ABNORMAL HIGH (ref 83–108)

## 2024-01-05 LAB — ECHOCARDIOGRAM COMPLETE
AR max vel: 1.74 cm2
AV Area VTI: 1.92 cm2
AV Area mean vel: 1.8 cm2
AV Mean grad: 4.3 mmHg
AV Peak grad: 8.4 mmHg
Ao pk vel: 1.45 m/s
Area-P 1/2: 2.07 cm2
Height: 73 in
MV VTI: 1.92 cm2
S' Lateral: 3.2 cm
Weight: 2402.13 [oz_av]

## 2024-01-05 LAB — GLUCOSE, CAPILLARY
Glucose-Capillary: 102 mg/dL — ABNORMAL HIGH (ref 70–99)
Glucose-Capillary: 108 mg/dL — ABNORMAL HIGH (ref 70–99)
Glucose-Capillary: 127 mg/dL — ABNORMAL HIGH (ref 70–99)
Glucose-Capillary: 146 mg/dL — ABNORMAL HIGH (ref 70–99)
Glucose-Capillary: 163 mg/dL — ABNORMAL HIGH (ref 70–99)

## 2024-01-05 LAB — TROPONIN I (HIGH SENSITIVITY): Troponin I (High Sensitivity): 50 ng/L — ABNORMAL HIGH (ref <18)

## 2024-01-05 LAB — CBC
HCT: 27.4 % — ABNORMAL LOW (ref 39.0–52.0)
Hemoglobin: 9.2 g/dL — ABNORMAL LOW (ref 13.0–17.0)
MCH: 28.2 pg (ref 26.0–34.0)
MCHC: 33.6 g/dL (ref 30.0–36.0)
MCV: 84 fL (ref 80.0–100.0)
Platelets: 294 10*3/uL (ref 150–400)
RBC: 3.26 MIL/uL — ABNORMAL LOW (ref 4.22–5.81)
RDW: 15.7 % — ABNORMAL HIGH (ref 11.5–15.5)
WBC: 16 10*3/uL — ABNORMAL HIGH (ref 4.0–10.5)
nRBC: 0 % (ref 0.0–0.2)

## 2024-01-05 LAB — COMPREHENSIVE METABOLIC PANEL
ALT: 42 U/L (ref 0–44)
AST: 93 U/L — ABNORMAL HIGH (ref 15–41)
Albumin: 1.6 g/dL — ABNORMAL LOW (ref 3.5–5.0)
Alkaline Phosphatase: 52 U/L (ref 38–126)
Anion gap: 11 (ref 5–15)
BUN: 39 mg/dL — ABNORMAL HIGH (ref 8–23)
CO2: 23 mmol/L (ref 22–32)
Calcium: 7.2 mg/dL — ABNORMAL LOW (ref 8.9–10.3)
Chloride: 101 mmol/L (ref 98–111)
Creatinine, Ser: 1.72 mg/dL — ABNORMAL HIGH (ref 0.61–1.24)
GFR, Estimated: 40 mL/min — ABNORMAL LOW (ref >60)
Glucose, Bld: 164 mg/dL — ABNORMAL HIGH (ref 70–99)
Potassium: 4.7 mmol/L (ref 3.5–5.1)
Sodium: 135 mmol/L (ref 135–145)
Total Bilirubin: 0.7 mg/dL (ref 0.0–1.2)
Total Protein: 3.9 g/dL — ABNORMAL LOW (ref 6.5–8.1)

## 2024-01-05 LAB — LACTIC ACID, PLASMA
Lactic Acid, Venous: 2 mmol/L (ref 0.5–1.9)
Lactic Acid, Venous: 1.5 mmol/L (ref 0.5–1.9)
Lactic Acid, Venous: 1.2 mmol/L (ref 0.5–1.9)

## 2024-01-05 LAB — HEMOGLOBIN AND HEMATOCRIT, BLOOD
HCT: 24 % — ABNORMAL LOW (ref 39.0–52.0)
Hemoglobin: 7.9 g/dL — ABNORMAL LOW (ref 13.0–17.0)

## 2024-01-05 LAB — PHOSPHORUS: Phosphorus: 5.6 mg/dL — ABNORMAL HIGH (ref 2.5–4.6)

## 2024-01-05 LAB — MAGNESIUM: Magnesium: 1.8 mg/dL (ref 1.7–2.4)

## 2024-01-05 MED ORDER — FAT EMUL FISH OIL/PLANT BASED 20% (SMOFLIPID)IV EMUL
250.0000 mL | INTRAVENOUS | Status: AC
Start: 2024-01-05 — End: 2024-01-06
  Administered 2024-01-05: 250 mL via INTRAVENOUS
  Filled 2024-01-05: qty 250

## 2024-01-05 MED ORDER — ORAL CARE MOUTH RINSE: 15.0000 mL | OROMUCOSAL | Status: DC | PRN

## 2024-01-05 MED ORDER — PROPOFOL 1000 MG/100ML IV EMUL
5.0000 ug/kg/min | INTRAVENOUS | Status: DC
  Administered 2024-01-05 (×2): 10 ug/kg/min via INTRAVENOUS
  Filled 2024-01-05: qty 100

## 2024-01-05 MED ORDER — LACTATED RINGERS IV BOLUS
1000.0000 mL | Freq: Once | INTRAVENOUS | Status: AC
  Administered 2024-01-05: 1000 mL via INTRAVENOUS

## 2024-01-05 MED ORDER — ALBUMIN HUMAN 25 % IV SOLN
25.0000 g | Freq: Once | INTRAVENOUS | Status: AC
  Administered 2024-01-05: 12.5 g via INTRAVENOUS
  Filled 2024-01-05: qty 100

## 2024-01-05 MED ORDER — ORAL CARE MOUTH RINSE
15.0000 mL | OROMUCOSAL | Status: DC
  Administered 2024-01-05: 15 mL via OROMUCOSAL

## 2024-01-05 MED ORDER — LACTATED RINGERS IV BOLUS
500.0000 mL | Freq: Once | INTRAVENOUS | Status: AC
  Administered 2024-01-05: 500 mL via INTRAVENOUS

## 2024-01-05 MED ORDER — HYDROMORPHONE HCL 1 MG/ML IJ SOLN
0.5000 mg | INTRAMUSCULAR | Status: DC | PRN
  Administered 2024-01-05: 1 mg via INTRAVENOUS
  Administered 2024-01-05: 0.5 mg via INTRAVENOUS
  Administered 2024-01-05 – 2024-01-07 (×5): 1 mg via INTRAVENOUS
  Administered 2024-01-07 (×2): 0.5 mg via INTRAVENOUS
  Administered 2024-01-08 – 2024-01-09 (×6): 1 mg via INTRAVENOUS
  Administered 2024-01-10: 0.5 mg via INTRAVENOUS
  Filled 2024-01-05 (×17): qty 1

## 2024-01-05 MED ORDER — CHLORHEXIDINE GLUCONATE CLOTH 2 % EX PADS
6.0000 | MEDICATED_PAD | Freq: Every day | CUTANEOUS | Status: DC
  Administered 2024-01-05 – 2024-01-10 (×6): 6 via TOPICAL

## 2024-01-05 MED ORDER — MAGNESIUM SULFATE 2 GM/50ML IV SOLN
2.0000 g | Freq: Once | INTRAVENOUS | Status: AC
Start: 2024-01-05 — End: 2024-01-05
  Administered 2024-01-05: 2 g via INTRAVENOUS
  Filled 2024-01-05: qty 50

## 2024-01-05 MED ORDER — TRACE MINERALS CU-MN-SE-ZN 300-55-60-3000 MCG/ML IV SOLN
INTRAVENOUS | Status: AC
  Filled 2024-01-05: qty 1000

## 2024-01-05 NOTE — Progress Notes (Signed)
 Nutrition Follow Up Note   DOCUMENTATION CODES:   Severe malnutrition in context of chronic illness  INTERVENTION:   TPN per pharmacy   Recommend thiamine 100mg  IV daily   Pt at high refeed risk; recommend monitor potassium, magnesium and phosphorus labs daily until stable  Daily weights   NUTRITION DIAGNOSIS:   Severe Malnutrition related to chronic illness as evidenced by severe fat depletion, severe muscle depletion. -ongoing   GOAL:   Patient will meet greater than or equal to 90% of their needs -not met   MONITOR:   Labs, Weight trends, Diet advancement, Skin, I & O's  ASSESSMENT:   78 y/o male with h/o HTN, HLD, DM, dementia, diverticulitis, GERD, HLD, DJD and inguinal hernia s/p repair who is admitted with LBO secondary to colon mass (Stage IIa colon adenocarcinoma) s/p exploratory laparotomy with right hemicolectomy with primary anastomosis 3/2 complicated by post op ileus, atrial fibrillation with RVR, septic shock and anastamotic leak requiring return to the OR 3/6 for bowel resection with ileostomy and mucous fistula placement.  Pt s/p return to the OR yesterday. Pt extubated this morning. Plan is for TPN initiation today via the central line. Pt is at high refeed risk. NGT in place to LIS with minimal output. No bowel function yet. Per chart, pt appears to be at his UBW.    Medications reviewed and include: lovenox, insulin, protonix, diflucan, LRS @100ml /hr, Mg sulfate, levophed, zosyn   Labs reviewed: Na 135 wnl, K 4.7 wnl, BUN 39(H), creat 1.72(H), P 5.6(H), Mg 1.8 wnl Wbc- 16.0(H), Hgb 9.2(L), Hct 27.4(L) Cbgs- 127, 102 x 24 hrs   Drains-   Diet Order:   Diet Order     None      EDUCATION NEEDS:   No education needs have been identified at this time  Skin:  Skin Assessment: Reviewed RN Assessment (incision abdomen)  Last BM:  3/7- type 6  Height:   Ht Readings from Last 1 Encounters:  12/30/23 6\' 1"  (1.854 m)    Weight:   Wt  Readings from Last 1 Encounters:  01/05/24 68.1 kg    Ideal Body Weight:  83.6 kg  BMI:  Body mass index is 19.81 kg/m.  Estimated Nutritional Needs:   Kcal:  2000-2300kcal/day  Protein:  100-115g/day  Fluid:  1.8-2.1L/day  Betsey Holiday MS, RD, LDN If unable to be reached, please send secure chat to "RD inpatient" available from 8:00a-4:00p daily

## 2024-01-05 NOTE — Progress Notes (Signed)
 Pt's wife and son updated at bedside on plan of care.  All questions answered.      Harlon Ditty, AGACNP-BC [facility] Prefer epic messenger for cross cover needs If after hours, please call E-link

## 2024-01-05 NOTE — Progress Notes (Signed)
 Patient ID: Alex Bennison., male   DOB: Sep 11, 1946, 78 y.o.   MRN: [id number] [facility]    SUBJECTIVE: Intubated sedated   Vitals:   01/05/24 0711 01/05/24 0742 01/05/24 0800 01/05/24 0945  BP:    (!) 126/54  Pulse:  75 72 80  Resp:  16 (!) 24 17  Temp:  (!) 97.1 F (36.2 C)    TempSrc:  Axillary    SpO2: 96% 96% 95% 96%  Weight:      Height:         Intake/Output Summary (Last 24 hours) at 01/05/2024 0951 Last data filed at 01/05/2024 7829 Gross per 24 hour  Intake 6009.21 ml  Output 690 ml  Net 5319.21 ml      PHYSICAL EXAM  General: Intubated sedated well developed, well nourished, in no acute distress HEENT:  Normocephalic and atramatic Neck:  No JVD.  Lungs: Clear bilaterally to auscultation and percussion. Heart: Irregular irregular tachycardic. Normal S1 and S2 without gallops or murmurs.  Abdomen: Abdominal wound bandage bowel sounds are positive, abdomen soft and non-tender  Msk:  Back normal, normal gait. Normal strength and tone for age. Extremities: No clubbing, cyanosis or edema.   Neuro: Alert and oriented X 3. Psych:  Good affect, responds appropriately   LABS: Basic Metabolic Panel: Recent Labs    01/04/24 0430 01/04/24 1805 01/04/24 2326 01/05/24 0303  NA 134*   < > 132* 135  K 4.5   < > 4.7 4.7  CL 98   < > 100 101  CO2 24   < > 22 23  GLUCOSE 169*   < > 189* 164*  BUN 25*   < > 38* 39*  CREATININE 0.89   < > 1.61* 1.72*  CALCIUM 8.2*   < > 7.1* 7.2*  MG 2.0  --   --  1.8  PHOS 4.1  --   --  5.6*   < > = values in this interval not displayed.   Liver Function Tests: Recent Labs    01/04/24 1805 01/05/24 0303  AST 104* 93*  ALT 45* 42  ALKPHOS 58 52  BILITOT 0.6 0.7  PROT 4.1* 3.9*  ALBUMIN 1.8* 1.6*   No results for input(s): "LIPASE", "AMYLASE" in the last 72 hours. CBC: Recent Labs    01/04/24 1805 01/05/24 0303  WBC 21.5* 16.0*  NEUTROABS 20.1*  --   HGB 10.0* 9.2*  HCT 30.3* 27.4*  MCV 84.9 84.0  PLT 344  294   Cardiac Enzymes: No results for input(s): "CKTOTAL", "CKMB", "CKMBINDEX", "TROPONINI" in the last 72 hours. BNP: Invalid input(s): "POCBNP" D-Dimer: No results for input(s): "DDIMER" in the last 72 hours. Hemoglobin A1C: No results for input(s): "HGBA1C" in the last 72 hours. Fasting Lipid Panel: No results for input(s): "CHOL", "HDL", "LDLCALC", "TRIG", "CHOLHDL", "LDLDIRECT" in the last 72 hours. Thyroid Function Tests: No results for input(s): "TSH", "T4TOTAL", "T3FREE", "THYROIDAB" in the last 72 hours.  Invalid input(s): "FREET3" Anemia Panel: No results for input(s): "VITAMINB12", "FOLATE", "FERRITIN", "TIBC", "IRON", "RETICCTPCT" in the last 72 hours.  DG Chest Port 1 View Result Date: 01/04/2024 CLINICAL DATA:  [id number].  Check endotracheal tube positioning. EXAM: PORTABLE CHEST 1 VIEW COMPARISON:  Portable chest today at 2:34 p.m. FINDINGS: 6:28 p.m. ETT tip is 5.9 cm from the carina roughly mid tracheal. NGT curves to the left and cephalad within the stomach with the tip in the proximal fundus. Left subclavian line tip remains in the proximal SVC.  Multiple overlying monitor wires. Electrical pads overlie the lower left chest. The heart is slightly enlarged. The mediastinum is normally outlined. Aortic atherosclerosis. Mild central vascular prominence is again noted without overt edema. There are small pleural effusions, overlying atelectasis or consolidation in the bases and elevated right diaphragm. Remaining lungs clear with no new or worsened findings. IMPRESSION: 1. ETT tip 5.9 cm from the carina roughly mid tracheal. 2. NGT adequately placed as above. 3. Left subclavian line tip remains in the proximal SVC. 4. Small pleural effusions, overlying atelectasis or consolidation in the bases and elevated right diaphragm. Overall aeration seems unchanged. 5. Aortic atherosclerosis. Electronically Signed   By: Almira Bar M.D.   On: 01/04/2024 20:45   DG Chest 1 View Result Date:  01/04/2024 CLINICAL DATA:  Central line placement. EXAM: CHEST  1 VIEW COMPARISON:  12/16/2022 FINDINGS: Normal sized heart. Tortuous and partially calcified thoracic aorta. Poor inspiration with interval bibasilar atelectasis. Interval left subclavian catheter with its tip in the proximal superior vena cava no pneumothorax. Diffuse osteopenia. IMPRESSION: 1. Interval left subclavian catheter placement with its tip in the proximal superior vena cava. No pneumothorax. 2. Poor inspiration with interval bibasilar atelectasis. Electronically Signed   By: Beckie Salts M.D.   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 Hypotension, tachycardia, abdominal pain and suspected anastomotic leak clinically. EXAM: CT ABDOMEN AND PELVIS WITH CONTRAST TECHNIQUE: Multidetector CT imaging of the abdomen and pelvis was performed using the standard protocol following bolus administration of intravenous contrast. RADIATION DOSE REDUCTION: This exam was performed according to the departmental dose-optimization program which includes automated exposure control, adjustment of the mA and/or kV according to patient size and/or use of iterative reconstruction technique. CONTRAST:  OMNIPAQUE IOHEXOL 300 MG/ML  SOLN COMPARISON:  12/29/2023 FINDINGS: Lower chest: Small bilateral pleural effusions, right greater than left with associated bibasilar atelectasis. Hepatobiliary: No focal liver abnormality is seen. Some gallbladder distension without overt gallbladder inflammation, visible calculi or biliary ductal dilatation. Pancreas: Atrophic pancreas without inflammation or masses. No pancreatic ductal dilatation. Spleen: Normal in size without focal abnormality. Adrenals/Urinary Tract: Adrenal glands are unremarkable. Kidneys are normal, without renal calculi,  focal lesion, or hydronephrosis. Tiny amount of air in the bladder lumen likely relates to prior catheterization. Stomach/Bowel: Small amount of fluid in the stomach. Air in multiple small bowel loops with mild small bowel dilatation approaching 4 cm in maximum diameter. Findings are consistent with ileus. At the level of the right-sided ileocolic anastomosis located just along the inferior aspect of the gallbladder, there is suspicion by CT of an anastomotic leak with visible air communicating from the anterior aspect of the anastomosis to a large pocket of air and fluid in the anterior right perihepatic space continuing superiorly to the subphrenic space. This pocket of fluid and air measures up to 12.5 cm in transverse diameter and 16 cm in height. Multiple other pockets of free air are present in the anterior peritoneal cavity including the left subphrenic space superior to the stomach. Multiple areas of associated free fluid throughout the peritoneal cavity with the second largest area of fluid along the lateral left upper quadrant and left subphrenic space abutting the spleen. Dependent fluid also present in the pelvis. Vascular/Lymphatic: Stable severe atherosclerosis of the abdominal aorta without aneurysmal disease. No enlarged lymph nodes identified in the abdomen or pelvis. Reproductive:  Prostate is unremarkable. Other: No hernias. Musculoskeletal: No acute or significant osseous findings. IMPRESSION: 1. Findings consistent with anastomotic leak at the level of the right-sided ileocolic anastomosis located just along the inferior aspect of the gallbladder. There is visible air communicating from the anterior aspect of the anastomosis to a large pocket of air and fluid in the anterior right perihepatic space continuing superiorly to the subphrenic space. This pocket of fluid and air measures up to 12.5 cm in transverse diameter and 16 cm in height. Multiple other pockets of free air are present in the  anterior peritoneal cavity including the left subphrenic space superior to the stomach. Multiple areas of associated free fluid throughout the peritoneal cavity with the second largest area of fluid along the lateral left upper quadrant and left subphrenic space abutting the spleen. Dependent fluid also present in the pelvis. 2. Small bilateral pleural effusions, right greater than left with associated bibasilar atelectasis. 3. Atrophic pancreas without inflammation or masses. 4. Stable severe atherosclerosis of the abdominal aorta without aneurysmal disease. 5. I reviewed findings directly in person with Dr. Maurine Minister shortly after the CT was performed. Electronically Signed   By: Irish Lack M.D.   On: 01/04/2024 13:35   DG Abd 1 View Result Date: 01/04/2024 CLINICAL DATA:  Abdominal distention. Postoperative day 4 from exploratory laparotomy and right hemicolectomy. EXAM: ABDOMEN - 1 VIEW COMPARISON:  01/01/2024 FINDINGS: Gas dilated small bowel loops are identified in the left abdomen measuring up to 4.7 cm diameter. Gas dilated loops in the lower left abdomen and right abdomen are less distended. No substantial colonic gas. Rigler Sign associated with small bowel in the left abdomen and lucency over the liver is consistent with intraperitoneal free air. Surgical skin staples overlie the midline. IMPRESSION: 1. Gas dilated small bowel loops in the left abdomen with less distended loops in the lower left abdomen and right abdomen. Given the patient is 4 days out from surgery, features are felt to be compatible with postoperative ileus. As of all vein obstruction is not excluded, close follow-up warranted. 2. Intraperitoneal free air is not unexpected given recent surgery. Electronically Signed   By: Kennith Center M.D.   On: 01/04/2024 08:23     Echo pending  TELEMETRY: Atrial fibrillation rate of 75:  ASSESSMENT AND PLAN:  Principal Problem:   Large bowel obstruction (HCC) Active Problems:   HTN  (hypertension), benign   Type 2 diabetes mellitus (HCC)   Malignant neoplasm of transverse colon (HCC)   Dementia with behavioral disturbance (HCC)   Electrolyte abnormality   Colon adenocarcinoma (HCC)   Palliative care encounter   Hypotension   Paroxysmal atrial fibrillation with RVR (HCC)   Protein-calorie malnutrition, severe   Septic shock (HCC)    Plan Atrial fibrillation rapid ventricular response recommend amiodarone therapy for rhythm and rate control patient hypotensive so Cardizem or beta-blockers are prohibitive at this stage Postop right hemicolectomy adenocarcinoma of the colon management and workup as per surgery Patient had an episode of anastomotic leak requiring we surgical exploration Recommend heparin when safe as per surgery for anticoagulation because of atrial fibrillation Conservative supportive cardiac input at this stage   [name], MD 01/05/2024 9:51 AM

## 2024-01-05 NOTE — Plan of Care (Signed)
  Problem: Metabolic: Goal: Ability to maintain appropriate glucose levels will improve Outcome: Progressing   Problem: Skin Integrity: Goal: Risk for impaired skin integrity will decrease Outcome: Progressing   Problem: Tissue Perfusion: Goal: Adequacy of tissue perfusion will improve Outcome: Progressing   Problem: Clinical Measurements: Goal: Diagnostic test results will improve Outcome: Progressing Goal: Respiratory complications will improve Outcome: Progressing Goal: Cardiovascular complication will be avoided Outcome: Progressing   Problem: Coping: Goal: Level of anxiety will decrease Outcome: Progressing   Problem: Elimination: Goal: Will not experience complications related to bowel motility Outcome: Progressing   Problem: Pain Managment: Goal: General experience of comfort will improve and/or be controlled Outcome: Progressing   Problem: Safety: Goal: Ability to remain free from injury will improve Outcome: Progressing   Problem: Skin Integrity: Goal: Risk for impaired skin integrity will decrease Outcome: Progressing

## 2024-01-05 NOTE — Progress Notes (Signed)
 PT Cancellation Note  Patient Details Name: Alex BULLINGER Sr. MRN: [id number] DOB: December 10, 1945   Cancelled Treatment:    Reason Eval/Treat Not Completed: Medical issues which prohibited therapy (Per chart review, patient transferred to CCU secondary to decline in status, s/p emergent ex-lap.  Initial therapy orders completed; please re-consult as medically appropriate.)   Rosbel Buckner H. Manson Passey, PT, DPT, NCS 01/05/24, 7:46 AM [phone]

## 2024-01-05 NOTE — Progress Notes (Signed)
 NAME:  Alex Brickell., MRN:  [id number], DOB:  1946-04-14, LOS: 7 ADMISSION DATE:  12/29/2023, CONSULTATION DATE: 01/04/2024 REFERRING MD: , CHIEF COMPLAINT: Dr. 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 Taken for ex-lap 01/04/24 and noted to have breakdown of the anastomosis with contaminated peritoneum. S/p resection with mucosal pouch and end ileostomy. Admitted to the ICU for post op care, remained intubated due to metabolic acidosis and multiple metabolic derangements.   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 Donneta Romberg.  He presented to [facility] ER on 02/28 from the cancer center for possible bowel obstruction.  Per ER notes the pt c/o generalized abdominal pain along with nausea/vomiting, onset several days prior to presentation.  He also had poor po intake.    ED Course In the ER significant lab results were: Na+ 130/K+ 3.2/chloride 91/glucose 194/BUN 29/wbc 3.8/hgb 10.7.  CT Abd/Pelvis concerning for worsening obstruction in the proximal transverse colon secondary to the known adenocarcinoma and possible nonspecific enterocolitis.  It was reported the pt did have a bowel movement prior to arrival to the ER.  Medications received in the ER were: 1L IV fluid bolus/zofrain/morphine.  General Surgery consulted and recommended hospital admission and to keep pt NPO for potential procedure on 02/29.  Pt admitted to the medsurg unit per hospitalist team.  See detailed hospital course below under significant events.  CT Abd/Pelvis W Contrast: Worsening obstruction in the proximal transverse colon secondary to the known adenocarcinoma compared with CT 10/14/2023. Increased wall thickening of the cecum and ascending  colon compared to 10/14/2023. There are few additional segments of wall thickening in the mid and distal small bowel. Findings are favored to represent nonspecific enterocolitis. Question mild diverticulitis in the descending colon. Aortic Atherosclerosis (ICD10-I70.0).  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  Degenerative Joint Disease of Lumbar Spine GERD  HLD Benign HTN  Kidney Stones Hypercholesterolemia  Type II Diabetes Mellitis  CKD Stage II   Micro Data:  3/6: MRSA PCR>>negative  Anti-infectives (From admission, onward)    Start     Dose/Rate Route Frequency Ordered Stop   01/05/24 2000  fluconazole (DIFLUCAN) IVPB 400 mg        400 mg 100 mL/hr over 120 Minutes Intravenous Every 24 hours 01/04/24 1859     01/04/24 2000  fluconazole (DIFLUCAN) IVPB 800 mg        800 mg 200 mL/hr over 120 Minutes Intravenous  Once 01/04/24 1859 01/04/24 2216   01/04/24 1400  piperacillin-tazobactam (ZOSYN) IVPB 3.375 g        3.375 g 12.5 mL/hr over 240 Minutes Intravenous Every 8 hours 01/04/24 1233     01/01/24 0600  piperacillin-tazobactam (ZOSYN) IVPB 3.375 g        3.375 g 12.5 mL/hr over 240 Minutes Intravenous Every 8 hours 12/31/23 1427 01/02/24 0945   12/31/23 2200  metroNIDAZOLE (FLAGYL) tablet 1,000 mg  Status:  Discontinued        1,000 mg Oral  Once 12/30/23 1138 12/30/23 1832   12/31/23 2200  neomycin (MYCIFRADIN) tablet 1,000 mg  Status:  Discontinued  1,000 mg Oral  Once 12/30/23 1138 12/30/23 1832   12/31/23 1500  metroNIDAZOLE (FLAGYL) tablet 1,000 mg  Status:  Discontinued        1,000 mg Oral  Once 12/30/23 1138 12/30/23 1832   12/31/23 1500  neomycin (MYCIFRADIN) tablet 1,000 mg  Status:  Discontinued        1,000 mg Oral  Once 12/30/23 1138 12/30/23 1832   12/31/23 1400  metroNIDAZOLE (FLAGYL) tablet 1,000 mg  Status:   Discontinued        1,000 mg Oral  Once 12/30/23 1138 12/30/23 1832   12/31/23 1400  neomycin (MYCIFRADIN) tablet 1,000 mg  Status:  Discontinued        1,000 mg Oral  Once 12/30/23 1138 12/30/23 1832   12/31/23 0800  ertapenem (INVANZ) 1 g in sodium chloride 0.9 % 100 mL IVPB        1 g 200 mL/hr over 30 Minutes Intravenous  Once 12/30/23 1832 12/31/23 0858   12/30/23 2200  neomycin (MYCIFRADIN) tablet 1,000 mg  Status:  Discontinued        1,000 mg Oral  Once 12/30/23 0915 12/30/23 1138   12/30/23 2200  metroNIDAZOLE (FLAGYL) tablet 1,000 mg  Status:  Discontinued        1,000 mg Oral  Once 12/30/23 0915 12/30/23 1138   12/30/23 1500  neomycin (MYCIFRADIN) tablet 1,000 mg  Status:  Discontinued        1,000 mg Oral  Once 12/30/23 0915 12/30/23 1138   12/30/23 1500  metroNIDAZOLE (FLAGYL) tablet 1,000 mg  Status:  Discontinued        1,000 mg Oral  Once 12/30/23 0915 12/30/23 1138   12/30/23 1400  neomycin (MYCIFRADIN) tablet 1,000 mg  Status:  Discontinued        1,000 mg Oral  Once 12/30/23 0915 12/30/23 1138   12/30/23 1400  metroNIDAZOLE (FLAGYL) tablet 1,000 mg  Status:  Discontinued        1,000 mg Oral  Once 12/30/23 0915 12/30/23 1138      Significant Hospital Events: Including procedures, antibiotic start and stop dates in addition to other pertinent events   02/28: Admitted to the medsurg unit with obstructing colon cancer  03/01: General Surgery consulted with recommendations to try a slow bowel prep over 2 days in anticipation of surgery.  However, following ingestion of a quarter of the full prep pt developed worsening abdominal distension.  KUB concerning for dilated loops of small bowel concerning for obstruction.  NGT placed by general surgery for decompression with plans to take pt to the OR on 03/2 for exploratory laparotomy and right colectomy. 03/02: Pt underwent exploratory laparotomy right hemicolectomy with primary anastomosis per general surgery  03/04: NGT removed  pt had bowel movement and continued to pass flatus.  However, upon general surgery examination pt noted to be slightly distended.  Pathology report consistent with T3 N0 colon cancer all margins negative  03/05: Palliative Care consulted to address goals of treatments: decision to continue full scope of care  03/06: Rapid response initiated due to pt developing hypotension and atrial fibrillation with rvr.  Pt also cold/clammy with abdominal pain and worsening distention.  He received 1L IV fluid bolus and amiodarone gtt initiated, however he remained hypotensive requiring transfer to ICU.  Cardiology consulted.  CT Abd/Pelvis finding consistent with a leak.  Pt taken back to the OR for exploratory lap requiring anastomotic resection; end ileostomy; and fistula. Postop pt remained mechanically intubated  03/07:  No significant events overnight. Remains on vent, on minimal vent settings (28% FiO2 & 5 PEEP).  Will plan for WUA and SBT as tolerated, optimistic for extubation. Shock, sepsis, and lactic acidosis improving, weaned to 3 mcg Levophed, vasopressin weaned off.  Interim History / Subjective:  -No significant events noted overnight -Afebrile, remains on Levo (weaned to 3 mcg) and Vasopressin -On minimal vent settings, will plan for WUA and SBT as tolerated -Echocardiogram results pending  Objective   Blood pressure (!) 119/57, pulse 76, temperature 98.2 F (36.8 C), temperature source Axillary, resp. rate (!) 24, height 6\' 1"  (1.854 m), weight 68.1 kg, SpO2 96%.    Vent Mode: PRVC FiO2 (%):  [28 %-40 %] 28 % Set Rate:  [24 bmp] 24 bmp Vt Set:  [500 mL] 500 mL PEEP:  [5 cmH20] 5 cmH20   Intake/Output Summary (Last 24 hours) at 01/05/2024 0738 Last data filed at 01/05/2024 0725 Gross per 24 hour  Intake 5696.92 ml  Output 615 ml  Net 5081.92 ml   Filed Weights   12/29/23 1418 12/30/23 0028 01/05/24 0420  Weight: 70.8 kg 69.2 kg 68.1 kg    Examination: General: Acutely on chronically  ill appearing male, NAD mechanically intubated  HENT: Supple, no JVD , orally intubated Lungs: Faint rhonchi throughout, even, non labored, synchronous with vent Cardiovascular: NSR, s1s2, no m/r/g, 1+ radial/1+ distal pulses, no edema  Abdomen: No audible BS, non distended, RLQ end ileostomy, LUQ mucous fistula, right-sided JP drain with serosanguinous output, midline abdominal incision clean dry and intact,  See image below   Extremities: Normal bulk and tone, no deformities, no edema Neuro: Sedated, RASS -5, PERRL GU: Indwelling foley catheter draining dark yellow urine   Resolved Hospital Problem list     Assessment & Plan:   #Acute toxic metabolic encephalopathy  #Postop pain  #Mechanical intubation pain/discomfort  Hx: Moderate lewy body dementia  -Treatment of metabolic derangements as outlined below -Maintain a RASS goal of 0 to -1 -Propofol and Fentanyl as needed to maintain RASS goal -Avoid sedating medications as able -Daily wake up assessment  #Circulatory shock: Septic #New onset atrial fibrillation with rvr ~ CONVERTED TO NSR #Mildly elevated troponin suspect secondary to demand ischemia  Hx: HTN, HLD, and hypercholesterolemia  Echocardiogram 01/04/24: -Continuous cardiac monitoring -Maintain MAP >65 -IV fluids -Vasopressors as needed to maintain MAP goal -Trend lactic acid until normalized  -Trend HS Troponin until peaked -Continue Amiodarone gtt -Resume heparin gtt once general surgery ok with resuming   #Post-Op Ventilator Management -Full vent support, implement lung protective strategies -Plateau pressures less than 30 cm H20 -Wean FiO2 & PEEP as tolerated to maintain O2 sats >92% -Follow intermittent Chest X-ray & ABG as needed -Spontaneous Breathing Trials when respiratory parameters met and mental status permits -Implement VAP Bundle -Prn Bronchodilators  #CKD stage II #Mild hyponatremia ~ RESOLVED  #Metabolic acidosis ~ RESOLVED #Lactic acidosis  ~ IMPROVING -Monitor I&O's / urinary output -Follow BMP -Ensure adequate renal perfusion -Avoid nephrotoxic agents as able -Replace electrolytes as indicated ~ Pharmacy following for assistance with electrolyte replacement -IV fluids  #Large bowel obstruction secondary to colon cancer cancer s/p hemicolectomy 03/2 #Anastomotic leak s/p ex-lap anastomotic resection, end ileostomy, and mucous fistula -General surgery following, appreciate input  -NGT to LIS  -Keep NPO for now  -Per general surgery will initiate TPN on 03/07  #Sepsis  #Intraabdominal infection  - Trend WBC and monitor fever curve  - Trend PCT  - Continue zosyn (plan for  5 day course per general surgery recommendations), along with Diflucan  #Chronic iron deficiency anemia  #Acute blood loss anemia secondary to surgery  -Monitor for S/Sx of bleeding -Trend CBC -SCD's for VTE Prophylaxis  -Transfuse for Hgb <8  #Type II diabetes mellitus  -CBG's q4hrs  -SSI  -Target CBG range 140 to 180  -Follow hyper/hypoglycemic protocol     Best Practice (right click and "Reselect all SmartList Selections" daily)   Diet/type: NPO; per General Surgery will start TPN on 01/05/24 DVT prophylaxis SCD (resume Heparin gtt when cleared by Surgery) Pressure ulcer(s): N/A GI prophylaxis: PPI Lines: Central line and is still needed Foley:  Yes, and it is still needed Code Status:  full code Last date of multidisciplinary goals of care discussion [3/7]  3/7: Will update pt's family when they arrive at bedside.  Labs   CBC: Recent Labs  Lab 12/31/23 0732 12/31/23 0738 01/02/24 0422 01/03/24 0436 01/04/24 0430 01/04/24 1805 01/05/24 0303  WBC 3.2*   < > 11.2* 9.5 4.2 21.5* 16.0*  NEUTROABS 1.8  --   --   --   --  20.1*  --   HGB 9.3*   < > 8.1* 8.9* 10.5* 10.0* 9.2*  HCT 28.5*   < > 24.8* 27.2* 31.3* 30.3* 27.4*  MCV 86.6   < > 85.5 84.7 83.2 84.9 84.0  PLT 240   < > 279 284 374 344 294   < > = values in this  interval not displayed.    Basic Metabolic Panel: Recent Labs  Lab 12/31/23 0732 12/31/23 4098 01/01/24 0440 01/02/24 0422 01/03/24 0436 01/04/24 0430 01/04/24 1805 01/04/24 2326 01/05/24 0303  NA 134*   < > 137 140 136 134* 133* 132* 135  K 3.7   < > 4.2 4.2 4.1 4.5 5.2* 4.7 4.7  CL 101   < > 106 106 103 98 100 100 101  CO2 25  --  23 25 27 24 22 22 23   GLUCOSE 103*   < > 136* 119* 126* 169* 216* 189* 164*  BUN 16   < > 24* 23 14 25* 36* 38* 39*  CREATININE 0.72   < > 0.87 0.69 0.66 0.89 1.59* 1.61* 1.72*  CALCIUM 7.9*  --  7.6* 7.8* 7.9* 8.2* 7.1* 7.1* 7.2*  MG 1.9  --  2.0 2.2  --  2.0  --   --  1.8  PHOS 2.4*  --  2.6 2.6  --  4.1  --   --  5.6*   < > = values in this interval not displayed.   GFR: Estimated Creatinine Clearance: 34.6 mL/min (A) (by C-G formula based on SCr of 1.72 mg/dL (H)). Recent Labs  Lab 01/03/24 0436 01/04/24 0430 01/04/24 0857 01/04/24 1805 01/04/24 1808 01/04/24 2037 01/04/24 2326 01/05/24 0303 01/05/24 0409  PROCALCITON  --   --   --  16.76  --   --   --   --   --   WBC 9.5 4.2  --  21.5*  --   --   --  16.0*  --   LATICACIDVEN  --   --    < >  --  3.3* 4.7* 3.0*  --  2.0*   < > = values in this interval not displayed.    Liver Function Tests: Recent Labs  Lab 12/29/23 1420 01/01/24 0440 01/04/24 1805 01/05/24 0303  AST 17  --  104* 93*  ALT 15  --  45* 42  ALKPHOS 91  --  58 52  BILITOT 1.1  --  0.6 0.7  PROT 6.7  --  4.1* 3.9*  ALBUMIN 3.5 2.3* 1.8* 1.6*   Recent Labs  Lab 12/29/23 1420  LIPASE 20   No results for input(s): "AMMONIA" in the last 168 hours.  ABG    Component Value Date/Time   PHART 7.43 01/04/2024 2352   PCO2ART 32 01/04/2024 2352   PO2ART 135 (H) 01/04/2024 2352   HCO3 21.2 01/04/2024 2352   TCO2 23 12/31/2023 0738   ACIDBASEDEF 2.3 (H) 01/04/2024 2352   O2SAT 99.6 01/04/2024 2352     Coagulation Profile: Recent Labs  Lab 01/04/24 1805  INR 1.5*    Cardiac Enzymes: No results for  input(s): "CKTOTAL", "CKMB", "CKMBINDEX", "TROPONINI" in the last 168 hours.  HbA1C: Hgb A1c MFr Bld  Date/Time Value Ref Range Status  12/30/2023 06:07 AM 6.9 (H) 4.8 - 5.6 % Final    Comment:    (NOTE) Pre diabetes:          5.7%-6.4%  Diabetes:              >6.4%  Glycemic control for   <7.0% adults with diabetes   06/15/2023 01:48 PM 7.6 (H) 4.8 - 5.6 % Final    Comment:    (NOTE)         Prediabetes: 5.7 - 6.4         Diabetes: >6.4         Glycemic control for adults with diabetes: <7.0     CBG: Recent Labs  Lab 01/04/24 0844 01/04/24 0923 01/04/24 1144 01/04/24 1823 01/04/24 1948  GLUCAP 148* 146* 173* 144* 174*    Review of Systems:   Unable to assess pt mechanically intubated   Past Medical History:  He,  has a past medical history of Anemia, Atherosclerosis of abdominal aorta (HCC), Blind right eye, Colon cancer (HCC), Dementia (HCC), 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   Surgical History:   Past Surgical History:  Procedure Laterality Date   AMPUTATION TOE Right 03/01/2020   Procedure: AMPUTATION TOE;  Surgeon: [name], DPM;  Location: [facility] ORS;  Service: Podiatry;  Laterality: Right;   AMPUTATION TOE Right 01/03/2023   Procedure: AMPUTATION TOE, 2nd right;  Surgeon: [name], DPM;  Location: [facility];  Service: Podiatry;  Laterality: Right;   APPENDECTOMY     BIOPSY  12/20/2023   Procedure: BIOPSY;  Surgeon: Norma Fredrickson, Boykin Nearing, MD;  Location: [facility];  Service: Gastroenterology;;   BONE EXCISION Right 01/03/2023   Procedure: Exostectomy interphalangeal joint right great toe;   Surgeon: [name], DPM;  Location: [facility];  Service: Podiatry;  Laterality: Right;   CATARACT EXTRACTION W/PHACO Left 09/06/2021   Procedure: CATARACT EXTRACTION PHACO AND INTRAOCULAR LENS PLACEMENT (IOC) LEFT 2.71 00:30.6;  Surgeon: Nevada Crane, MD;  Location: [facility];  Service: Ophthalmology;  Laterality: Left;  Diabetic   COLONOSCOPY WITH PROPOFOL N/A 12/20/2023   Procedure: COLONOSCOPY WITH PROPOFOL;  Surgeon: Toledo, Boykin Nearing, MD;  Location: [facility] ENDOSCOPY;  Service: Gastroenterology;  Laterality: N/A;   ESOPHAGOGASTRODUODENOSCOPY (EGD) WITH PROPOFOL N/A 12/20/2023   Procedure: ESOPHAGOGASTRODUODENOSCOPY (EGD) WITH PROPOFOL;  Surgeon: Norma Fredrickson, Boykin Nearing, MD;  Location: [facility] ENDOSCOPY;  Service: Gastroenterology;  Laterality: N/A;   EYE SURGERY     age 75   HERNIA REPAIR     INSERTION OF MESH  06/23/2023   Procedure: INSERTION OF MESH;  Surgeon: Sung Amabile, DO;  Location: [facility] OR[facility];  Service: General;;   KIDNEY STONE SURGERY     LAPAROTOMY Right 12/31/2023   Procedure: EXPLORATORY LAPAROTOMY WITH RIGHT COLECTOMY;  Surgeon: Kandis Cocking, MD;  Location: [facility];  Service: General;  Laterality: Right;   REMOVAL RETAINED LENS Right 04/19/2021   Procedure: CATARACT EXTRACTION PHACO,  RIGHT VISION BLUE 0.20 00:01.4 case aborted;  Surgeon: Nevada Crane, MD;  Location: [facility];  Service: Ophthalmology;  Laterality: Right;  Diabetic - oral meds   SHOULDER ARTHROSCOPY WITH BICEPS TENDON REPAIR Right 10/06/2016   Procedure: SHOULDER ARTHROSCOPY WITH BICEPS TENDON REPAIR;  Surgeon: Christena Flake, MD;  Location: [facility] OR[facility];  Service: Orthopedics;  Laterality: Right;   SHOULDER ARTHROSCOPY WITH OPEN ROTATOR CUFF REPAIR Right 10/06/2016   Procedure: SHOULDER ARTHROSCOPY WITH OPEN ROTATOR CUFF REPAIR;  Surgeon: Christena Flake, MD;  Location: [facility];  Service: Orthopedics;  Laterality: Right;   SHOULDER ARTHROSCOPY WITH SUBACROMIAL DECOMPRESSION Right 10/06/2016    Procedure: SHOULDER ARTHROSCOPY WITH SUBACROMIAL DECOMPRESSION AND DEBRIDEMENT;  Surgeon: Christena Flake, MD;  Location: [facility];  Service: Orthopedics;  Laterality: Right;   SUBMUCOSAL TATTOO INJECTION  12/20/2023   Procedure: SUBMUCOSAL TATTOO INJECTION;  Surgeon: Toledo, Boykin Nearing, MD;  Location: [facility] ENDOSCOPY;  Service: Gastroenterology;;   TONSILLECTOMY       Social History:   reports that he quit smoking about 54 years ago. His smoking use included cigarettes. He started smoking about 64 years ago. He has a 2.5 pack-year smoking history. He has been exposed to tobacco smoke. He has quit using smokeless tobacco. He reports that he does not drink alcohol and does not use drugs.   Family History:  His Family history is unknown by patient.   Allergies No Known Allergies   Home Medications  Prior to Admission medications   Medication Sig Start Date End Date Taking? Authorizing Provider  aspirin 81 MG tablet Take 81 mg by mouth daily.   Yes [provider]  atorvastatin (LIPITOR) 80 MG tablet Take 80 mg by mouth daily.   Yes [provider]  Cholecalciferol (VITAMIN D3) 50 MCG (2000 UT) TABS Take 1 tablet by mouth daily.   Yes [provider]  cyanocobalamin (VITAMIN B12) 1000 MCG tablet Take 1,000 mcg by mouth daily.   Yes [provider]  ferrous sulfate 325 (65 FE) MG tablet Take 325 mg by mouth 2 (two) times daily with a meal.   Yes [provider]  glipiZIDE (GLUCOTROL) 5 MG tablet Take 5 mg by mouth daily.   Yes [provider]  losartan-hydrochlorothiazide (HYZAAR) 100-12.5 MG tablet Take 1 tablet by mouth daily.   Yes [provider]  metFORMIN (GLUCOPHAGE) 500 MG tablet Take 500 mg by mouth 2 (two) times daily with a meal.   Yes [provider]  pioglitazone (ACTOS) 15 MG tablet Take 15 mg by mouth daily.   Yes [provider]  rivastigmine (EXELON) 3 MG capsule Take 3 mg by mouth 2 (two) times daily.    Yes [provider]     Critical care time: 40 minutes     Harlon Ditty, AGACNP-BC [facility] Prefer epic messenger for cross cover needs If after hours,  please call E-link

## 2024-01-05 NOTE — Progress Notes (Signed)
 Pt. Extubated to hfnc at 10L,hr 84,rr17,sat 97. No apparent distress noted at this time.

## 2024-01-05 NOTE — Progress Notes (Signed)
*  PRELIMINARY RESULTS* Echocardiogram 2D Echocardiogram has been performed.  Alex Soto 01/05/2024, 8:51 AM

## 2024-01-05 NOTE — Progress Notes (Signed)
   01/05/24 1000  Spiritual Encounters  Type of Visit Follow up  Care provided to: Pt and family  Referral source Chaplain assessment  Reason for visit Routine spiritual support  OnCall Visit No   Chaplain followed up with patient and family after the patients surgery. Patients son stated that he was happy to see that his dad is getting better. He thanked me and chaplain services for continuing to support his family and father. Chaplain informed him that chaplain services is available for emotional and spiritual support whenever they need.

## 2024-01-05 NOTE — TOC Progression Note (Signed)
 Transition of Care (TOC) - Progression Note    Patient Details  Name: Alex GLADD Sr. MRN: [id number] Date of Birth: [birth year]  Transition of Care [facility]) CM/SW Contact  Garret Reddish, RN Phone Number: 01/05/2024, 11:02 AM  Clinical Narrative:    Chart reviewed.  Noted that patient was admitted with Large bowel obstruction.  Patient had exploratory lap with hemicolectomy with primary anastomosis on 12-31-23.  Patient has an illestomy and a mucus pouch.  Noted that was take back to surgery yesterday due to anastomotic leak and septic shock.  Patient required ventilator support after surgery.  Patient has been extubated today. Patient is currently on 10L of HFNC.    Patient may require TPN.  Noted that patient may require SNF on discharge. Will follow PT/OT evaluations once patient is able to participate.    TOC will continue to follow for discharge planning.          Expected Discharge Plan and Services                                               Social Determinants of Health (SDOH) Interventions SDOH Screenings   Food Insecurity: No Food Insecurity (12/29/2023)  Housing: Low Risk  (12/29/2023)  Transportation Needs: No Transportation Needs (12/29/2023)  Utilities: Not At Risk (12/29/2023)  Depression (PHQ2-9): Low Risk  (12/27/2023)  Financial Resource Strain: Low Risk  (07/18/2023)   Received from [facility]  Social Connections: Moderately Integrated (12/30/2023)  Tobacco Use: Medium Risk (12/30/2023)    Readmission Risk Interventions     No data to display

## 2024-01-05 NOTE — Progress Notes (Addendum)
 CC: Large bowel obstruction, anastamotic leak Subjective: Patient taken to OR yesterday secondary to anastamotic leak and septic shock. Overnight received 1.5 of crystalloid and albumin resusictation. Continues to have lactic acidosis but improving. Wakes up to voice on exam this morning and when sedation paused will follow commands.   Objective: Vital signs in last 24 hours: Temp:  [97.1 F (36.2 C)-99.7 F (37.6 C)] 97.1 F (36.2 C) (03/07 0742) Pulse Rate:  [37-88] 75 (03/07 0742) Resp:  [14-38] 16 (03/07 0742) BP: (70-133)/(40-104) 119/57 (03/06 1945) SpO2:  [91 %-100 %] 96 % (03/07 0742) Arterial Line BP: (96-157)/(38-54) 145/49 (03/07 0742) FiO2 (%):  [28 %-40 %] 28 % (03/07 0711) Weight:  [68.1 kg] 68.1 kg (03/07 0420) Last BM Date : 01/02/24  Intake/Output from previous day: 03/06 0701 - 03/07 0700 In: 5661.6 [I.V.:3439.1; IV Piggyback:2222.5] Out: 615 [Urine:315; Drains:250; Blood:50] Intake/Output this shift: Total I/O In: 35.3 [I.V.:26.1; IV Piggyback:9.2] Out: -   Physical exam: ETT in place Abdomen is soft, slightly distended, ileostomy with some bowel sweat, abd pads without shadowing. 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   Lab Results: CBC  Recent Labs    01/04/24 1805 01/05/24 0303  WBC 21.5* 16.0*  HGB 10.0* 9.2*  HCT 30.3* 27.4*  PLT 344 294   BMET Recent Labs    01/04/24 2326 01/05/24 0303  NA 132* 135  K 4.7 4.7  CL 100 101  CO2 22 23  GLUCOSE 189* 164*  BUN 38* 39*  CREATININE 1.61* 1.72*  CALCIUM 7.1* 7.2*   PT/INR Recent Labs    01/04/24 1805  LABPROT 18.1*  INR 1.5*   ABG Recent Labs    01/04/24 1808 01/04/24 2352  PHART 7.35 7.43  HCO3 23.7 21.2    Studies/Results: DG Chest Port 1 View Result Date: 01/04/2024 CLINICAL DATA:  [id number].  Check endotracheal tube positioning. EXAM: PORTABLE CHEST 1 VIEW COMPARISON:  Portable chest  today at 2:34 p.m. FINDINGS: 6:28 p.m. ETT tip is 5.9 cm from the carina roughly mid tracheal. NGT curves to the left and cephalad within the stomach with the tip in the proximal fundus. Left subclavian line tip remains in the proximal SVC. Multiple overlying monitor wires. Electrical pads overlie the lower left chest. The heart is slightly enlarged. The mediastinum is normally outlined. Aortic atherosclerosis. Mild central vascular prominence is again noted without overt edema. There are small pleural effusions, overlying atelectasis or consolidation in the bases and elevated right diaphragm. Remaining lungs clear with no new or worsened findings. IMPRESSION: 1. ETT tip 5.9 cm from the carina roughly mid tracheal. 2. NGT adequately placed as above. 3. Left subclavian line tip remains in the proximal SVC. 4. Small pleural effusions, overlying atelectasis or consolidation in the bases and elevated right diaphragm. Overall aeration seems unchanged. 5. Aortic atherosclerosis. Electronically Signed   By: Almira Bar M.D.   On: 01/04/2024 20:45   DG Chest 1 View Result Date: 01/04/2024 CLINICAL DATA:  Central line placement. EXAM: CHEST  1 VIEW COMPARISON:  12/16/2022 FINDINGS: Normal sized heart. Tortuous and partially calcified thoracic aorta. Poor inspiration with interval bibasilar atelectasis. Interval left subclavian catheter with its tip in the proximal superior vena cava no pneumothorax. Diffuse osteopenia. IMPRESSION: 1. Interval left subclavian catheter placement with its tip in the proximal superior vena cava. No pneumothorax. 2. Poor inspiration with interval bibasilar atelectasis. Electronically Signed   By: Beckie Salts  M.D.   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 Hypotension, tachycardia, abdominal pain and suspected  anastomotic leak clinically. EXAM: CT ABDOMEN AND PELVIS WITH CONTRAST TECHNIQUE: Multidetector CT imaging of the abdomen and pelvis was performed using the standard protocol following bolus administration of intravenous contrast. RADIATION DOSE REDUCTION: This exam was performed according to the departmental dose-optimization program which includes automated exposure control, adjustment of the mA and/or kV according to patient size and/or use of iterative reconstruction technique. CONTRAST:  OMNIPAQUE IOHEXOL 300 MG/ML  SOLN COMPARISON:  12/29/2023 FINDINGS: Lower chest: Small bilateral pleural effusions, right greater than left with associated bibasilar atelectasis. Hepatobiliary: No focal liver abnormality is seen. Some gallbladder distension without overt gallbladder inflammation, visible calculi or biliary ductal dilatation. Pancreas: Atrophic pancreas without inflammation or masses. No pancreatic ductal dilatation. Spleen: Normal in size without focal abnormality. Adrenals/Urinary Tract: Adrenal glands are unremarkable. Kidneys are normal, without renal calculi, focal lesion, or hydronephrosis. Tiny amount of air in the bladder lumen likely relates to prior catheterization. Stomach/Bowel: Small amount of fluid in the stomach. Air in multiple small bowel loops with mild small bowel dilatation approaching 4 cm in maximum diameter. Findings are consistent with ileus. At the level of the right-sided ileocolic anastomosis located just along the inferior aspect of the gallbladder, there is suspicion by CT of an anastomotic leak with visible air communicating from the anterior aspect of the anastomosis to a large pocket of air and fluid in the anterior right perihepatic space continuing superiorly to the subphrenic space. This pocket of fluid and air measures up to 12.5 cm in transverse diameter and 16 cm in height. Multiple other pockets of free air are present in the anterior peritoneal cavity including the  left subphrenic space superior to the stomach. Multiple areas of associated free fluid throughout the peritoneal cavity with the second largest area of fluid along the lateral left upper quadrant and left subphrenic space abutting the spleen. Dependent fluid also present in the pelvis. Vascular/Lymphatic: Stable severe atherosclerosis of the abdominal aorta without aneurysmal disease. No enlarged lymph nodes identified in the abdomen or pelvis. Reproductive: Prostate is unremarkable. Other: No hernias. Musculoskeletal: No acute or significant osseous findings. IMPRESSION: 1. Findings consistent with anastomotic leak at the level of the right-sided ileocolic anastomosis located just along the inferior aspect of the gallbladder. There is visible air communicating from the anterior aspect of the anastomosis to a large pocket of air and fluid in the anterior right perihepatic space continuing superiorly to the subphrenic space. This pocket of fluid and air measures up to 12.5 cm in transverse diameter and 16 cm in height. Multiple other pockets of free air are present in the anterior peritoneal cavity including the left subphrenic space superior to the stomach. Multiple areas of associated free fluid throughout the peritoneal cavity with the second largest area of fluid along the lateral left upper quadrant and left subphrenic space abutting the spleen. Dependent fluid also present in the pelvis. 2. Small bilateral pleural effusions, right greater than left with associated bibasilar atelectasis. 3. Atrophic pancreas without inflammation or masses. 4. Stable severe atherosclerosis of the abdominal aorta without aneurysmal disease. 5. I reviewed findings directly in person with Dr. Maurine Minister shortly after the CT was performed. Electronically Signed   By: Irish Lack M.D.   On: 01/04/2024 13:35   DG  Abd 1 View Result Date: 01/04/2024 CLINICAL DATA:  Abdominal distention. Postoperative day 4 from exploratory laparotomy  and right hemicolectomy. EXAM: ABDOMEN - 1 VIEW COMPARISON:  01/01/2024 FINDINGS: Gas dilated small bowel loops are identified in the left abdomen measuring up to 4.7 cm diameter. Gas dilated loops in the lower left abdomen and right abdomen are less distended. No substantial colonic gas. Rigler Sign associated with small bowel in the left abdomen and lucency over the liver is consistent with intraperitoneal free air. Surgical skin staples overlie the midline. IMPRESSION: 1. Gas dilated small bowel loops in the left abdomen with less distended loops in the lower left abdomen and right abdomen. Given the patient is 4 days out from surgery, features are felt to be compatible with postoperative ileus. As of all vein obstruction is not excluded, close follow-up warranted. 2. Intraperitoneal free air is not unexpected given recent surgery. Electronically Signed   By: Kennith Center M.D.   On: 01/04/2024 08:23    Anti-infectives: Anti-infectives (From admission, onward)    Start     Dose/Rate Route Frequency Ordered Stop   01/05/24 2000  fluconazole (DIFLUCAN) IVPB 400 mg        400 mg 100 mL/hr over 120 Minutes Intravenous Every 24 hours 01/04/24 1859     01/04/24 2000  fluconazole (DIFLUCAN) IVPB 800 mg        800 mg 200 mL/hr over 120 Minutes Intravenous  Once 01/04/24 1859 01/04/24 2216   01/04/24 1400  piperacillin-tazobactam (ZOSYN) IVPB 3.375 g        3.375 g 12.5 mL/hr over 240 Minutes Intravenous Every 8 hours 01/04/24 1233     01/01/24 0600  piperacillin-tazobactam (ZOSYN) IVPB 3.375 g        3.375 g 12.5 mL/hr over 240 Minutes Intravenous Every 8 hours 12/31/23 1427 01/02/24 0945   12/31/23 2200  metroNIDAZOLE (FLAGYL) tablet 1,000 mg  Status:  Discontinued        1,000 mg Oral  Once 12/30/23 1138 12/30/23 1832   12/31/23 2200  neomycin (MYCIFRADIN) tablet 1,000 mg  Status:  Discontinued        1,000 mg Oral  Once 12/30/23 1138 12/30/23 1832   12/31/23 1500  metroNIDAZOLE (FLAGYL) tablet  1,000 mg  Status:  Discontinued        1,000 mg Oral  Once 12/30/23 1138 12/30/23 1832   12/31/23 1500  neomycin (MYCIFRADIN) tablet 1,000 mg  Status:  Discontinued        1,000 mg Oral  Once 12/30/23 1138 12/30/23 1832   12/31/23 1400  metroNIDAZOLE (FLAGYL) tablet 1,000 mg  Status:  Discontinued        1,000 mg Oral  Once 12/30/23 1138 12/30/23 1832   12/31/23 1400  neomycin (MYCIFRADIN) tablet 1,000 mg  Status:  Discontinued        1,000 mg Oral  Once 12/30/23 1138 12/30/23 1832   12/31/23 0800  ertapenem (INVANZ) 1 g in sodium chloride 0.9 % 100 mL IVPB        1 g 200 mL/hr over 30 Minutes Intravenous  Once 12/30/23 1832 12/31/23 0858   12/30/23 2200  neomycin (MYCIFRADIN) tablet 1,000 mg  Status:  Discontinued        1,000 mg Oral  Once 12/30/23 0915 12/30/23 1138   12/30/23 2200  metroNIDAZOLE (FLAGYL) tablet 1,000 mg  Status:  Discontinued        1,000 mg Oral  Once 12/30/23 0915 12/30/23 1138   12/30/23 1500  neomycin (MYCIFRADIN) tablet 1,000 mg  Status:  Discontinued        1,000 mg Oral  Once 12/30/23 0915 12/30/23 1138   12/30/23 1500  metroNIDAZOLE (FLAGYL) tablet 1,000 mg  Status:  Discontinued        1,000 mg Oral  Once 12/30/23 0915 12/30/23 1138   12/30/23 1400  neomycin (MYCIFRADIN) tablet 1,000 mg  Status:  Discontinued        1,000 mg Oral  Once 12/30/23 0915 12/30/23 1138   12/30/23 1400  metroNIDAZOLE (FLAGYL) tablet 1,000 mg  Status:  Discontinued        1,000 mg Oral  Once 12/30/23 0915 12/30/23 1138       Assessment/Plan:  Patient post-op day 5 and one from ex-lap, right colectomy, anastamotic leak s/p resection with ileostomy and mucous fistula placement.   N: Continue fentanyl as needed for pain and propofol for sedation. Hopeful that he can be extubated soon and can give morphine for pain. Okay to add toradol once kidney function improves. Continue IV tylenol P: Appreciate PCCM on ventilatory management.  C: Wean pressors as able, amiodarone for a. Fib, a.  Fib likely secondary to leak and intra-abdominal sepsis GI: NGT to LIWS, bowel sweat in ostomy, continue to monitor. Would start on TPN as he has been NPO for prolonged period.  RFEN: Continue LR, still in resuscitative phase: may need more volume today but hold off on bolus as he has responded to albumin nad LR given last night ID: Zosyn for 5 days, unsure if fluconazole is helpful at all given this was very distal but will defer to PCCM on abx. 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   Baker Pierini, M.D. [facility]

## 2024-01-05 NOTE — Progress Notes (Signed)
   01/05/24 0615  Spiritual Encounters  Type of Visit Follow up  Conversation partners present during encounter Nurse  Reason for visit Routine spiritual support  OnCall Visit Yes   Chaplain went over to the patient's room to see if any family present after his surgery.  Chaplain wanted to provide support and find out how the patient was doing.  Staff said no family was present so Chaplain asked to be called or she will pass on to her colleague to see if they can check on the family later.    Rev. Rana M. Earlene Plater, MDiv. Chaplain Resident [facility]

## 2024-01-05 NOTE — Progress Notes (Signed)
 PHARMACY - TOTAL PARENTERAL NUTRITION CONSULT NOTE   Indication: without adequate nutrition for 7 days   Patient Measurements: Height: 6\' 1"  (185.4 cm) Weight: 68.1 kg (150 lb 2.1 oz) IBW/kg (Calculated) : 79.9 TPN AdjBW (KG): 69.2 Body mass index is 19.81 kg/m.  Assessment: 78 y/o male with h/o HTN, HLD, DM, dementia, diverticulitis, GERD, HLD, DJD and inguinal hernia s/p repair who is admitted with LBO secondary to colon mass (Stage IIa colon adenocarcinoma) s/p exploratory laparotomy with right hemicolectomy with primary anastomosis 3/2 complicated by post op ileus and new onset atrial fibrillation with RVR.   Glucose / Insulin: SSI required/previous 24h: 8 units Electrolytes: hyperphosphatemia Renal: SCr 0.66 > 1.72 Hepatic: AST>ALT: improving Intake / Output;  03/06 0701 - 03/07 0700 In: 5661.6 [I.V.:3439.1; IV Piggyback:2222.5] Out: 615 [Urine:315; Drains:250; Blood:50] GI Imaging: 03/06 CT pelvis: anastomotic leak, Atrophic pancreas without inflammation   GI Surgeries / Procedures:  S/p ex-lap, right colectomy, anastamotic leak s/p resection with ileostomy and mucous fistula placement.   Central access: 01/04/24 TPN start date: 01/05/24  RD Assessment: Estimated Needs Total Energy Estimated Needs: 2000-2300kcal/day Total Protein Estimated Needs: 100-115g/day Total Fluid Estimated Needs: 1.8-2.1L/day  Current Nutrition:  NPO  Plan:  ---Start E8/10 TPN at 83mL/hr at 1800 ---Electrolytes in TPN(standard): Na 79mEq/L, K 59mEq/L, Ca 4.44mEq/L, Mg 32mEq/L, and Phos 74mmol/L. Cl:Ac 0.49 ---nutritional components:  Dextrose 680 kcal (200.8 g) Protein 640 kcal (160 g)  Total Kcal: 1819 ---Add standard MVI, thiamine 100 mg and trace elements to TPN ---continue Sensitive q4h SSI and adjust as needed  ---2 grams IV magnesium sulfate x 1 ---Monitor TPN labs on Mon/Thurs, daily until stable  Lowella Bandy 01/05/2024,10:02 AM

## 2024-01-06 DIAGNOSIS — G928 Other toxic encephalopathy: Secondary | ICD-10-CM | POA: Diagnosis not present

## 2024-01-06 DIAGNOSIS — A419 Sepsis, unspecified organism: Secondary | ICD-10-CM | POA: Diagnosis not present

## 2024-01-06 DIAGNOSIS — R7989 Other specified abnormal findings of blood chemistry: Secondary | ICD-10-CM | POA: Diagnosis not present

## 2024-01-06 DIAGNOSIS — R6521 Severe sepsis with septic shock: Secondary | ICD-10-CM | POA: Diagnosis not present

## 2024-01-06 LAB — MAGNESIUM: Magnesium: 2.2 mg/dL (ref 1.7–2.4)

## 2024-01-06 LAB — RENAL FUNCTION PANEL
Albumin: <1.5 g/dL — ABNORMAL LOW (ref 3.5–5.0)
Anion gap: 6 (ref 5–15)
BUN: 46 mg/dL — ABNORMAL HIGH (ref 8–23)
CO2: 24 mmol/L (ref 22–32)
Calcium: 6.8 mg/dL — ABNORMAL LOW (ref 8.9–10.3)
Chloride: 103 mmol/L (ref 98–111)
Creatinine, Ser: 1.37 mg/dL — ABNORMAL HIGH (ref 0.61–1.24)
GFR, Estimated: 53 mL/min — ABNORMAL LOW (ref >60)
Glucose, Bld: 157 mg/dL — ABNORMAL HIGH (ref 70–99)
Phosphorus: 4.6 mg/dL (ref 2.5–4.6)
Potassium: 3.9 mmol/L (ref 3.5–5.1)
Sodium: 133 mmol/L — ABNORMAL LOW (ref 135–145)

## 2024-01-06 LAB — CBC
HCT: 22.6 % — ABNORMAL LOW (ref 39.0–52.0)
Hemoglobin: 7.7 g/dL — ABNORMAL LOW (ref 13.0–17.0)
MCH: 28 pg (ref 26.0–34.0)
MCHC: 34.1 g/dL (ref 30.0–36.0)
MCV: 82.2 fL (ref 80.0–100.0)
Platelets: 156 10*3/uL (ref 150–400)
RBC: 2.75 MIL/uL — ABNORMAL LOW (ref 4.22–5.81)
RDW: 15.9 % — ABNORMAL HIGH (ref 11.5–15.5)
WBC: 13 10*3/uL — ABNORMAL HIGH (ref 4.0–10.5)
nRBC: 0 % (ref 0.0–0.2)

## 2024-01-06 LAB — GLUCOSE, CAPILLARY
Glucose-Capillary: 161 mg/dL — ABNORMAL HIGH (ref 70–99)
Glucose-Capillary: 154 mg/dL — ABNORMAL HIGH (ref 70–99)
Glucose-Capillary: 143 mg/dL — ABNORMAL HIGH (ref 70–99)
Glucose-Capillary: 195 mg/dL — ABNORMAL HIGH (ref 70–99)
Glucose-Capillary: 216 mg/dL — ABNORMAL HIGH (ref 70–99)

## 2024-01-06 MED ORDER — AMIODARONE LOAD VIA INFUSION
150.0000 mg | Freq: Once | INTRAVENOUS | Status: AC
  Administered 2024-01-06: 150 mg via INTRAVENOUS

## 2024-01-06 MED ORDER — SODIUM CHLORIDE 0.9% FLUSH: 10.0000 mL | INTRAVENOUS | Status: DC | PRN

## 2024-01-06 MED ORDER — FAT EMUL FISH OIL/PLANT BASED 20% (SMOFLIPID)IV EMUL
250.0000 mL | INTRAVENOUS | Status: AC
Start: 2024-01-06 — End: 2024-01-07
  Administered 2024-01-06: 250 mL via INTRAVENOUS
  Filled 2024-01-06: qty 250

## 2024-01-06 MED ORDER — THIAMINE HCL 100 MG/ML IJ SOLN
INTRAMUSCULAR | Status: AC
  Filled 2024-01-06: qty 2000

## 2024-01-06 MED ORDER — SODIUM CHLORIDE 0.9% FLUSH
10.0000 mL | Freq: Two times a day (BID) | INTRAVENOUS | Status: DC
  Administered 2024-01-06 – 2024-01-10 (×9): 10 mL

## 2024-01-06 MED ORDER — AMIODARONE IV BOLUS ONLY 150 MG/100ML: 150.0000 mg | Freq: Once | INTRAVENOUS | Status: AC

## 2024-01-06 MED ORDER — AMIODARONE IV BOLUS ONLY 150 MG/100ML
INTRAVENOUS | Status: AC
  Administered 2024-01-06: 150 mg via INTRAVENOUS
  Filled 2024-01-06: qty 100

## 2024-01-06 NOTE — Progress Notes (Signed)
 Patient ID: Alex Bowring., male   DOB: 07-23-1946, 78 y.o.   MRN: [id number] [facility]    SUBJECTIVE: Currently intubated sedated.  When all extremities with attempt to wean soon   Vitals:   01/06/24 1100 01/06/24 1200 01/06/24 1300 01/06/24 1400  BP:  (!) 95/51 (!) 91/46 (!) 106/54  Pulse: 83 76 74 75  Resp: (!) 28 12 11 15   Temp:  98.5 F (36.9 C)    TempSrc:  Oral    SpO2: 98% 94% 93% 97%  Weight:      Height:         Intake/Output Summary (Last 24 hours) at 01/06/2024 1441 Last data filed at 01/06/2024 1020 Gross per 24 hour  Intake 2218.17 ml  Output 1655 ml  Net 563.17 ml      PHYSICAL EXAM  General: Well developed, well nourished, in no acute distress HEENT:  Normocephalic and atramatic Neck:  No JVD.  Lungs: Clear bilaterally to auscultation and percussion. Heart: Irregular irregular. Normal S1 and S2 without gallops or murmurs.  Abdomen: Bowel sounds are positive, abdomen soft and non-tender  Msk:  Back normal, normal gait. Normal strength and tone for age. Extremities: No clubbing, cyanosis or edema.   Neuro: Alert and oriented X 3. Psych:  Good affect, responds appropriately   LABS: Basic Metabolic Panel: Recent Labs    01/05/24 0303 01/06/24 0420  NA 135 133*  K 4.7 3.9  CL 101 103  CO2 23 24  GLUCOSE 164* 157*  BUN 39* 46*  CREATININE 1.72* 1.37*  CALCIUM 7.2* 6.8*  MG 1.8 2.2  PHOS 5.6* 4.6   Liver Function Tests: Recent Labs    01/04/24 1805 01/05/24 0303 01/06/24 0420  AST 104* 93*  --   ALT 45* 42  --   ALKPHOS 58 52  --   BILITOT 0.6 0.7  --   PROT 4.1* 3.9*  --   ALBUMIN 1.8* 1.6* <1.5*   No results for input(s): "LIPASE", "AMYLASE" in the last 72 hours. CBC: Recent Labs    01/04/24 1805 01/05/24 0303 01/05/24 1508 01/06/24 0420  WBC 21.5* 16.0*  --  13.0*  NEUTROABS 20.1*  --   --   --   HGB 10.0* 9.2* 7.9* 7.7*  HCT 30.3* 27.4* 24.0* 22.6*  MCV 84.9 84.0  --  82.2  PLT 344 294  --  156   Cardiac  Enzymes: No results for input(s): "CKTOTAL", "CKMB", "CKMBINDEX", "TROPONINI" in the last 72 hours. BNP: Invalid input(s): "POCBNP" D-Dimer: No results for input(s): "DDIMER" in the last 72 hours. Hemoglobin A1C: No results for input(s): "HGBA1C" in the last 72 hours. Fasting Lipid Panel: No results for input(s): "CHOL", "HDL", "LDLCALC", "TRIG", "CHOLHDL", "LDLDIRECT" in the last 72 hours. Thyroid Function Tests: No results for input(s): "TSH", "T4TOTAL", "T3FREE", "THYROIDAB" in the last 72 hours.  Invalid input(s): "FREET3" Anemia Panel: No results for input(s): "VITAMINB12", "FOLATE", "FERRITIN", "TIBC", "IRON", "RETICCTPCT" in the last 72 hours.  ECHOCARDIOGRAM COMPLETE Result Date: 01/05/2024    ECHOCARDIOGRAM REPORT   Patient Name:   Alex COLIN Sr. Date of Exam: 01/05/2024 Medical Rec #:  [id number]          Height:       73.0 in Accession #:    [id number]         Weight:       150.1 lb Date of Birth:  1946/01/17         BSA:  1.905 m Patient Age:    77 years           BP:           89/59 mmHg Patient Gender: M                  HR:           64 bpm. Exam Location:  [facility] Procedure: 2D Echo, Color Doppler and Cardiac Doppler (Both Spectral and Color            Flow Doppler were utilized during procedure). Indications:     Atrial Fibrillation I48.91  History:         Patient has no prior history of Echocardiogram examinations.                  Risk Factors:Hypertension, Dyslipidemia and Diabetes.  Sonographer:     Cristela Blue Referring Phys:  [id number] CARALYN HUDSON Diagnosing Phys: [name] MD IMPRESSIONS  1. Left ventricular ejection fraction, by estimation, is 55 to 60%. The left ventricle has normal function. The left ventricle has no regional wall motion abnormalities. Left ventricular diastolic parameters are consistent with Grade I diastolic dysfunction (impaired relaxation).  2. Right ventricular systolic function is normal. The right ventricular size is normal.  3. The  mitral valve is normal in structure. Trivial mitral valve regurgitation.  4. The aortic valve is normal in structure. Aortic valve regurgitation is not visualized. FINDINGS  Left Ventricle: Left ventricular ejection fraction, by estimation, is 55 to 60%. The left ventricle has normal function. The left ventricle has no regional wall motion abnormalities. Strain was performed and the global longitudinal strain is indeterminate. The left ventricular internal cavity size was normal in size. There is borderline left ventricular hypertrophy. Left ventricular diastolic parameters are consistent with Grade I diastolic dysfunction (impaired relaxation). Right Ventricle: The right ventricular size is normal. No increase in right ventricular wall thickness. Right ventricular systolic function is normal. Left Atrium: Left atrial size was normal in size. Right Atrium: Right atrial size was normal in size. Pericardium: There is no evidence of pericardial effusion. Mitral Valve: The mitral valve is normal in structure. Trivial mitral valve regurgitation. MV peak gradient, 2.8 mmHg. The mean mitral valve gradient is 2.0 mmHg. Tricuspid Valve: The tricuspid valve is normal in structure. Tricuspid valve regurgitation is mild. Aortic Valve: The aortic valve is normal in structure. Aortic valve regurgitation is not visualized. Aortic valve mean gradient measures 4.3 mmHg. Aortic valve peak gradient measures 8.4 mmHg. Aortic valve area, by VTI measures 1.92 cm. Pulmonic Valve: The pulmonic valve was normal in structure. Pulmonic valve regurgitation is trivial. Aorta: The ascending aorta was not well visualized. IAS/Shunts: No atrial level shunt detected by color flow Doppler. Additional Comments: 3D was performed not requiring image post processing on an independent workstation and was indeterminate.  LEFT VENTRICLE PLAX 2D LVIDd:         4.50 cm   Diastology LVIDs:         3.20 cm   LV e' medial:    9.57 cm/s LV PW:         1.10 cm    LV E/e' medial:  6.4 LV IVS:        1.30 cm   LV e' lateral:   11.70 cm/s LVOT diam:     2.00 cm   LV E/e' lateral: 5.2 LV SV:         44 LV SV Index:  23 LVOT Area:     3.14 cm  RIGHT VENTRICLE RV Basal diam:  2.80 cm RV Mid diam:    3.00 cm RV S prime:     15.60 cm/s TAPSE (M-mode): 1.6 cm LEFT ATRIUM           Index        RIGHT ATRIUM           Index LA diam:      2.70 cm 1.42 cm/m   RA Area:     12.30 cm LA Vol (A2C): 60.9 ml 31.97 ml/m  RA Volume:   22.40 ml  11.76 ml/m LA Vol (A4C): 22.8 ml 11.97 ml/m  AORTIC VALVE AV Area (Vmax):    1.74 cm AV Area (Vmean):   1.80 cm AV Area (VTI):     1.92 cm AV Vmax:           145.33 cm/s AV Vmean:          96.400 cm/s AV VTI:            0.231 m AV Peak Grad:      8.4 mmHg AV Mean Grad:      4.3 mmHg LVOT Vmax:         80.60 cm/s LVOT Vmean:        55.100 cm/s LVOT VTI:          0.141 m LVOT/AV VTI ratio: 0.61  AORTA Ao Root diam: 3.57 cm MITRAL VALVE               TRICUSPID VALVE MV Area (PHT): 2.07 cm    TR Peak grad:   30.0 mmHg MV Area VTI:   1.92 cm    TR Vmax:        274.00 cm/s MV Peak grad:  2.8 mmHg MV Mean grad:  2.0 mmHg    SHUNTS MV Vmax:       0.83 m/s    Systemic VTI:  0.14 m MV Vmean:      58.4 cm/s   Systemic Diam: 2.00 cm MV Decel Time: 366 msec MV E velocity: 61.30 cm/s MV A velocity: 74.40 cm/s MV E/A ratio:  0.82 Adryanna Friedt D Daelon Dunivan MD Electronically signed by [name] MD Signature Date/Time: 01/05/2024/11:39:55 PM    Final    DG Chest Port 1 View Result Date: 01/04/2024 CLINICAL DATA:  [id number].  Check endotracheal tube positioning. EXAM: PORTABLE CHEST 1 VIEW COMPARISON:  Portable chest today at 2:34 p.m. FINDINGS: 6:28 p.m. ETT tip is 5.9 cm from the carina roughly mid tracheal. NGT curves to the left and cephalad within the stomach with the tip in the proximal fundus. Left subclavian line tip remains in the proximal SVC. Multiple overlying monitor wires. Electrical pads overlie the lower left chest. The heart is slightly enlarged.  The mediastinum is normally outlined. Aortic atherosclerosis. Mild central vascular prominence is again noted without overt edema. There are small pleural effusions, overlying atelectasis or consolidation in the bases and elevated right diaphragm. Remaining lungs clear with no new or worsened findings. IMPRESSION: 1. ETT tip 5.9 cm from the carina roughly mid tracheal. 2. NGT adequately placed as above. 3. Left subclavian line tip remains in the proximal SVC. 4. Small pleural effusions, overlying atelectasis or consolidation in the bases and elevated right diaphragm. Overall aeration seems unchanged. 5. Aortic atherosclerosis. Electronically Signed   By: Almira Bar M.D.   On: 01/04/2024 20:45     Echo nonspecific STTW changes preserved  left ventricular function EF of 55 to 60%:  TELEMETRY: Atrial fibrillation rate of 100   ASSESSMENT AND PLAN:  Principal Problem:   Large bowel obstruction (HCC) Active Problems:   HTN (hypertension), benign   Type 2 diabetes mellitus (HCC)   Malignant neoplasm of transverse colon (HCC)   Dementia with behavioral disturbance (HCC)   Electrolyte abnormality   Colon adenocarcinoma (HCC)   Palliative care encounter   Hypotension   Paroxysmal atrial fibrillation with RVR (HCC)   Protein-calorie malnutrition, severe   Septic shock (HCC)    Plan Paroxysmal atrial fibrillation.  Continue amiodarone therapy rate and rhythm much improved .  Right hemicolectomy for adenocarcinoma with we operation because of anastomotic leak patient now doing much better Hypertension continue pressure therapy to help with blood pressure support Diabetes type 2 uncomplicated continue insulin coverage sliding scale as necessary Septic shock continue broad-spectrum antibiotic therapy anastomotic leak Large bowel obstruction agree with surgical repair for large adenocarcinoma Agree with rate control for atrial fibrillation anticoagulation with heparin   [name],  MD 01/06/2024 2:41 PM

## 2024-01-06 NOTE — Plan of Care (Signed)
  Problem: Fluid Volume: Goal: Ability to maintain a balanced intake and output will improve Outcome: Progressing   Problem: Metabolic: Goal: Ability to maintain appropriate glucose levels will improve Outcome: Progressing   Problem: Nutritional: Goal: Maintenance of adequate nutrition will improve Outcome: Progressing Goal: Progress toward achieving an optimal weight will improve Outcome: Progressing   Problem: Skin Integrity: Goal: Risk for impaired skin integrity will decrease Outcome: Progressing   Problem: Nutrition: Goal: Adequate nutrition will be maintained Outcome: Progressing   Problem: Elimination: Goal: Will not experience complications related to bowel motility Outcome: Progressing Goal: Will not experience complications related to urinary retention Outcome: Progressing   Problem: Safety: Goal: Ability to remain free from injury will improve Outcome: Progressing

## 2024-01-06 NOTE — Progress Notes (Signed)
 PHARMACY - TOTAL PARENTERAL NUTRITION CONSULT NOTE   Indication: without adequate nutrition for 7 days   Patient Measurements: Height: 6\' 1"  (185.4 cm) Weight: 68.1 kg (150 lb 2.1 oz) IBW/kg (Calculated) : 79.9 TPN AdjBW (KG): 69.2 Body mass index is 19.81 kg/m.  Assessment: 78 y/o male with h/o HTN, HLD, DM, dementia, diverticulitis, GERD, HLD, DJD and inguinal hernia s/p repair who is admitted with LBO secondary to colon mass (Stage IIa colon adenocarcinoma) s/p exploratory laparotomy with right hemicolectomy with primary anastomosis 3/2 complicated by post op ileus and new onset atrial fibrillation with RVR.   Glucose / Insulin: SSI required/previous 24h: 8 units Electrolytes: hyperphosphatemia Renal: SCr 0.66 > 1.72 Hepatic: AST>ALT: improving Intake / Output;  03/06 0701 - 03/07 0700 In: 5661.6 [I.V.:3439.1; IV Piggyback:2222.5] Out: 615 [Urine:315; Drains:250; Blood:50] GI Imaging: 03/06 CT pelvis: anastomotic leak, Atrophic pancreas without inflammation   GI Surgeries / Procedures:  S/p ex-lap, right colectomy, anastamotic leak s/p resection with ileostomy and mucous fistula placement.   Central access: 01/04/24 TPN start date: 01/05/24  RD Assessment: Estimated Needs Total Energy Estimated Needs: 2000-2300kcal/day Total Protein Estimated Needs: 100-115g/day Total Fluid Estimated Needs: 1.8-2.1L/day  Current Nutrition:  NPO  Plan:  ---Increase to Clinimix E8/10 TPN at 3mL/hr at 1800 ---Electrolytes in TPN(standard): Na 60mEq/L, K 22mEq/L, Ca 4.52mEq/L, Mg 53mEq/L, and Phos 58mmol/L. Cl:Ac 0.49 ---nutritional components:  Dextrose 680 kcal (200.8 g) Protein 640 kcal (160 g)  Total Kcal: 1834 ---Add standard MVI, thiamine 100 mg and trace elements to TPN ---continue Sensitive q4h SSI and adjust as needed  ---2 grams IV magnesium sulfate x 1 ---Monitor TPN labs on Mon/Thurs, daily until stable  Alex Soto Alex Soto 01/06/2024,11:12 AM

## 2024-01-06 NOTE — Anesthesia Postprocedure Evaluation (Signed)
 Anesthesia Post Note  Patient: Alex MEHRINGER Sr.  Procedure(s) Performed: LAPAROTOMY, EXPLORATORY RESECTION OF ANASTOMOSIS, TAKE DOWN SPLENIC FLEXTURE CREATION, END ILEOSTOMY AND MUCOUS FISTULA  Patient location during evaluation: ICU Anesthesia Type: General Level of consciousness: awake Pain management: pain level controlled Vital Signs Assessment: post-procedure vital signs reviewed and stable Respiratory status: spontaneous breathing, nonlabored ventilation, respiratory function stable and patient connected to nasal cannula oxygen Cardiovascular status: blood pressure returned to baseline and stable Postop Assessment: no apparent nausea or vomiting Anesthetic complications: no   No notable events documented.   Last Vitals:  Vitals:   01/06/24 0150 01/06/24 0400  BP: (!) 105/59 (!) 111/57  Pulse: (!) 118 (!) 122  Resp: 10 13  Temp: 36.7 C 36.7 C  SpO2: 95% 96%    Last Pain:  Vitals:   01/06/24 0506  TempSrc:   PainSc: 5                  Corinda Gubler

## 2024-01-06 NOTE — Progress Notes (Signed)
 CC: Large bowel obstruction, anastamotic leak Subjective: Extubated yesterday, weaned off pressors overnight. Went back into A. Fib with RVR.  Reports thirst and dry lips Pain with movement.   Objective: Vital signs in last 24 hours: Temp:  [97.5 F (36.4 C)-98.5 F (36.9 C)] 98.5 F (36.9 C) (03/08 0726) Pulse Rate:  [73-126] 115 (03/08 0726) Resp:  [7-24] 10 (03/08 0726) BP: (77-149)/(47-73) 103/73 (03/08 0700) SpO2:  [93 %-97 %] 96 % (03/08 0726) Arterial Line BP: (111-174)/(37-59) 132/48 (03/08 0726) Last BM Date : 01/06/24  Intake/Output from previous day: 03/07 0701 - 03/08 0700 In: 2565.8 [I.V.:1229.5; IV Piggyback:1336.3] Out: 1685 [Urine:1525; Drains:55; Stool:105] Intake/Output this shift: No intake/output data recorded.  Physical exam: [place] in place Abdomen is soft, slightly distended, ileostomy with some liquid stool in it, dressing taken down and no signs of infection in wound.   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   Lab Results: CBC  Recent Labs    01/05/24 0303 01/05/24 1508 01/06/24 0420  WBC 16.0*  --  13.0*  HGB 9.2* 7.9* 7.7*  HCT 27.4* 24.0* 22.6*  PLT 294  --  156   BMET Recent Labs    01/05/24 0303 01/06/24 0420  NA 135 133*  K 4.7 3.9  CL 101 103  CO2 23 24  GLUCOSE 164* 157*  BUN 39* 46*  CREATININE 1.72* 1.37*  CALCIUM 7.2* 6.8*   PT/INR Recent Labs    01/04/24 1805  LABPROT 18.1*  INR 1.5*   ABG Recent Labs    01/04/24 1808 01/04/24 2352  PHART 7.35 7.43  HCO3 23.7 21.2    Studies/Results: ECHOCARDIOGRAM COMPLETE Result Date: 01/05/2024    ECHOCARDIOGRAM REPORT   Patient Name:   Alex CAMPAS Sr. Date of Exam: 01/05/2024 Medical Rec #:  [id number]          Height:       73.0 in Accession #:    [id number]         Weight:       150.1 lb Date of Birth:  November 11, 1945         BSA:          1.905 m Patient Age:    29 years           BP:            89/59 mmHg Patient Gender: M                  HR:           64 bpm. Exam Location:  [facility] Procedure: 2D Echo, Color Doppler and Cardiac Doppler (Both Spectral and Color            Flow Doppler were utilized during procedure). Indications:     Atrial Fibrillation I48.91  History:         Patient has no prior history of Echocardiogram examinations.                  Risk Factors:Hypertension, Dyslipidemia and Diabetes.  Sonographer:     Cristela Blue Referring Phys:  [id number] CARALYN HUDSON Diagnosing Phys: [name] MD IMPRESSIONS  1. Left ventricular ejection fraction, by estimation, is 55 to 60%. The left ventricle has normal function. The left ventricle has no regional wall motion abnormalities. Left ventricular diastolic parameters are consistent with Grade I diastolic dysfunction (impaired relaxation).  2. Right ventricular systolic  function is normal. The right ventricular size is normal.  3. The mitral valve is normal in structure. Trivial mitral valve regurgitation.  4. The aortic valve is normal in structure. Aortic valve regurgitation is not visualized. FINDINGS  Left Ventricle: Left ventricular ejection fraction, by estimation, is 55 to 60%. The left ventricle has normal function. The left ventricle has no regional wall motion abnormalities. Strain was performed and the global longitudinal strain is indeterminate. The left ventricular internal cavity size was normal in size. There is borderline left ventricular hypertrophy. Left ventricular diastolic parameters are consistent with Grade I diastolic dysfunction (impaired relaxation). Right Ventricle: The right ventricular size is normal. No increase in right ventricular wall thickness. Right ventricular systolic function is normal. Left Atrium: Left atrial size was normal in size. Right Atrium: Right atrial size was normal in size. Pericardium: There is no evidence of pericardial effusion. Mitral Valve: The mitral valve is normal in structure.  Trivial mitral valve regurgitation. MV peak gradient, 2.8 mmHg. The mean mitral valve gradient is 2.0 mmHg. Tricuspid Valve: The tricuspid valve is normal in structure. Tricuspid valve regurgitation is mild. Aortic Valve: The aortic valve is normal in structure. Aortic valve regurgitation is not visualized. Aortic valve mean gradient measures 4.3 mmHg. Aortic valve peak gradient measures 8.4 mmHg. Aortic valve area, by VTI measures 1.92 cm. Pulmonic Valve: The pulmonic valve was normal in structure. Pulmonic valve regurgitation is trivial. Aorta: The ascending aorta was not well visualized. IAS/Shunts: No atrial level shunt detected by color flow Doppler. Additional Comments: 3D was performed not requiring image post processing on an independent workstation and was indeterminate.  LEFT VENTRICLE PLAX 2D LVIDd:         4.50 cm   Diastology LVIDs:         3.20 cm   LV e' medial:    9.57 cm/s LV PW:         1.10 cm   LV E/e' medial:  6.4 LV IVS:        1.30 cm   LV e' lateral:   11.70 cm/s LVOT diam:     2.00 cm   LV E/e' lateral: 5.2 LV SV:         44 LV SV Index:   23 LVOT Area:     3.14 cm  RIGHT VENTRICLE RV Basal diam:  2.80 cm RV Mid diam:    3.00 cm RV S prime:     15.60 cm/s TAPSE (M-mode): 1.6 cm LEFT ATRIUM           Index        RIGHT ATRIUM           Index LA diam:      2.70 cm 1.42 cm/m   RA Area:     12.30 cm LA Vol (A2C): 60.9 ml 31.97 ml/m  RA Volume:   22.40 ml  11.76 ml/m LA Vol (A4C): 22.8 ml 11.97 ml/m  AORTIC VALVE AV Area (Vmax):    1.74 cm AV Area (Vmean):   1.80 cm AV Area (VTI):     1.92 cm AV Vmax:           145.33 cm/s AV Vmean:          96.400 cm/s AV VTI:            0.231 m AV Peak Grad:      8.4 mmHg AV Mean Grad:      4.3 mmHg LVOT Vmax:  80.60 cm/s LVOT Vmean:        55.100 cm/s LVOT VTI:          0.141 m LVOT/AV VTI ratio: 0.61  AORTA Ao Root diam: 3.57 cm MITRAL VALVE               TRICUSPID VALVE MV Area (PHT): 2.07 cm    TR Peak grad:   30.0 mmHg MV Area VTI:   1.92  cm    TR Vmax:        274.00 cm/s MV Peak grad:  2.8 mmHg MV Mean grad:  2.0 mmHg    SHUNTS MV Vmax:       0.83 m/s    Systemic VTI:  0.14 m MV Vmean:      58.4 cm/s   Systemic Diam: 2.00 cm MV Decel Time: 366 msec MV E velocity: 61.30 cm/s MV A velocity: 74.40 cm/s MV E/A ratio:  0.82 Dwayne D Callwood MD Electronically signed by [name] MD Signature Date/Time: 01/05/2024/11:39:55 PM    Final    DG Chest Port 1 View Result Date: 01/04/2024 CLINICAL DATA:  [id number].  Check endotracheal tube positioning. EXAM: PORTABLE CHEST 1 VIEW COMPARISON:  Portable chest today at 2:34 p.m. FINDINGS: 6:28 p.m. ETT tip is 5.9 cm from the carina roughly mid tracheal. NGT curves to the left and cephalad within the stomach with the tip in the proximal fundus. Left subclavian line tip remains in the proximal SVC. Multiple overlying monitor wires. Electrical pads overlie the lower left chest. The heart is slightly enlarged. The mediastinum is normally outlined. Aortic atherosclerosis. Mild central vascular prominence is again noted without overt edema. There are small pleural effusions, overlying atelectasis or consolidation in the bases and elevated right diaphragm. Remaining lungs clear with no new or worsened findings. IMPRESSION: 1. ETT tip 5.9 cm from the carina roughly mid tracheal. 2. NGT adequately placed as above. 3. Left subclavian line tip remains in the proximal SVC. 4. Small pleural effusions, overlying atelectasis or consolidation in the bases and elevated right diaphragm. Overall aeration seems unchanged. 5. Aortic atherosclerosis. Electronically Signed   By: Almira Bar M.D.   On: 01/04/2024 20:45   DG Chest 1 View Result Date: 01/04/2024 CLINICAL DATA:  Central line placement. EXAM: CHEST  1 VIEW COMPARISON:  12/16/2022 FINDINGS: Normal sized heart. Tortuous and partially calcified thoracic aorta. Poor inspiration with interval bibasilar atelectasis. Interval left subclavian catheter with its tip in the  proximal superior vena cava no pneumothorax. Diffuse osteopenia. IMPRESSION: 1. Interval left subclavian catheter placement with its tip in the proximal superior vena cava. No pneumothorax. 2. Poor inspiration with interval bibasilar atelectasis. Electronically Signed   By: Beckie Salts M.D.   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 Hypotension, tachycardia, abdominal pain and suspected anastomotic leak clinically. EXAM: CT ABDOMEN AND PELVIS WITH CONTRAST TECHNIQUE: Multidetector CT imaging of the abdomen and pelvis was performed using the standard protocol following bolus administration of intravenous contrast. RADIATION DOSE REDUCTION: This exam was performed according to the departmental dose-optimization program which includes automated exposure control, adjustment of the mA and/or kV according to patient size and/or use of iterative reconstruction technique. CONTRAST:  OMNIPAQUE IOHEXOL 300 MG/ML  SOLN COMPARISON:  12/29/2023 FINDINGS: Lower chest: Small bilateral pleural effusions, right greater than left with associated bibasilar atelectasis. Hepatobiliary: No  focal liver abnormality is seen. Some gallbladder distension without overt gallbladder inflammation, visible calculi or biliary ductal dilatation. Pancreas: Atrophic pancreas without inflammation or masses. No pancreatic ductal dilatation. Spleen: Normal in size without focal abnormality. Adrenals/Urinary Tract: Adrenal glands are unremarkable. Kidneys are normal, without renal calculi, focal lesion, or hydronephrosis. Tiny amount of air in the bladder lumen likely relates to prior catheterization. Stomach/Bowel: Small amount of fluid in the stomach. Air in multiple small bowel loops with mild small bowel dilatation approaching 4 cm in maximum diameter. Findings  are consistent with ileus. At the level of the right-sided ileocolic anastomosis located just along the inferior aspect of the gallbladder, there is suspicion by CT of an anastomotic leak with visible air communicating from the anterior aspect of the anastomosis to a large pocket of air and fluid in the anterior right perihepatic space continuing superiorly to the subphrenic space. This pocket of fluid and air measures up to 12.5 cm in transverse diameter and 16 cm in height. Multiple other pockets of free air are present in the anterior peritoneal cavity including the left subphrenic space superior to the stomach. Multiple areas of associated free fluid throughout the peritoneal cavity with the second largest area of fluid along the lateral left upper quadrant and left subphrenic space abutting the spleen. Dependent fluid also present in the pelvis. Vascular/Lymphatic: Stable severe atherosclerosis of the abdominal aorta without aneurysmal disease. No enlarged lymph nodes identified in the abdomen or pelvis. Reproductive: Prostate is unremarkable. Other: No hernias. Musculoskeletal: No acute or significant osseous findings. IMPRESSION: 1. Findings consistent with anastomotic leak at the level of the right-sided ileocolic anastomosis located just along the inferior aspect of the gallbladder. There is visible air communicating from the anterior aspect of the anastomosis to a large pocket of air and fluid in the anterior right perihepatic space continuing superiorly to the subphrenic space. This pocket of fluid and air measures up to 12.5 cm in transverse diameter and 16 cm in height. Multiple other pockets of free air are present in the anterior peritoneal cavity including the left subphrenic space superior to the stomach. Multiple areas of associated free fluid throughout the peritoneal cavity with the second largest area of fluid along the lateral left upper quadrant and left subphrenic space abutting the spleen.  Dependent fluid also present in the pelvis. 2. Small bilateral pleural effusions, right greater than left with associated bibasilar atelectasis. 3. Atrophic pancreas without inflammation or masses. 4. Stable severe atherosclerosis of the abdominal aorta without aneurysmal disease. 5. I reviewed findings directly in person with Dr. Maurine Minister shortly after the CT was performed. Electronically Signed   By: Irish Lack M.D.   On: 01/04/2024 13:35    Anti-infectives: Anti-infectives (From admission, onward)    Start     Dose/Rate Route Frequency Ordered Stop   01/05/24 2000  fluconazole (DIFLUCAN) IVPB 400 mg        400 mg 100 mL/hr over 120 Minutes Intravenous Every 24 hours 01/04/24 1859     01/04/24 2000  fluconazole (DIFLUCAN) IVPB 800 mg        800 mg 200 mL/hr over 120 Minutes Intravenous  Once 01/04/24 1859 01/04/24 2216   01/04/24 1400  piperacillin-tazobactam (ZOSYN) IVPB 3.375 g        3.375 g 12.5 mL/hr over 240 Minutes Intravenous Every 8 hours 01/04/24 1233     01/01/24 0600  piperacillin-tazobactam (ZOSYN) IVPB 3.375 g        3.375 g 12.5 mL/hr over  240 Minutes Intravenous Every 8 hours 12/31/23 1427 01/02/24 0945   12/31/23 2200  metroNIDAZOLE (FLAGYL) tablet 1,000 mg  Status:  Discontinued        1,000 mg Oral  Once 12/30/23 1138 12/30/23 1832   12/31/23 2200  neomycin (MYCIFRADIN) tablet 1,000 mg  Status:  Discontinued        1,000 mg Oral  Once 12/30/23 1138 12/30/23 1832   12/31/23 1500  metroNIDAZOLE (FLAGYL) tablet 1,000 mg  Status:  Discontinued        1,000 mg Oral  Once 12/30/23 1138 12/30/23 1832   12/31/23 1500  neomycin (MYCIFRADIN) tablet 1,000 mg  Status:  Discontinued        1,000 mg Oral  Once 12/30/23 1138 12/30/23 1832   12/31/23 1400  metroNIDAZOLE (FLAGYL) tablet 1,000 mg  Status:  Discontinued        1,000 mg Oral  Once 12/30/23 1138 12/30/23 1832   12/31/23 1400  neomycin (MYCIFRADIN) tablet 1,000 mg  Status:  Discontinued        1,000 mg Oral  Once  12/30/23 1138 12/30/23 1832   12/31/23 0800  ertapenem (INVANZ) 1 g in sodium chloride 0.9 % 100 mL IVPB        1 g 200 mL/hr over 30 Minutes Intravenous  Once 12/30/23 1832 12/31/23 0858   12/30/23 2200  neomycin (MYCIFRADIN) tablet 1,000 mg  Status:  Discontinued        1,000 mg Oral  Once 12/30/23 0915 12/30/23 1138   12/30/23 2200  metroNIDAZOLE (FLAGYL) tablet 1,000 mg  Status:  Discontinued        1,000 mg Oral  Once 12/30/23 0915 12/30/23 1138   12/30/23 1500  neomycin (MYCIFRADIN) tablet 1,000 mg  Status:  Discontinued        1,000 mg Oral  Once 12/30/23 0915 12/30/23 1138   12/30/23 1500  metroNIDAZOLE (FLAGYL) tablet 1,000 mg  Status:  Discontinued        1,000 mg Oral  Once 12/30/23 0915 12/30/23 1138   12/30/23 1400  neomycin (MYCIFRADIN) tablet 1,000 mg  Status:  Discontinued        1,000 mg Oral  Once 12/30/23 0915 12/30/23 1138   12/30/23 1400  metroNIDAZOLE (FLAGYL) tablet 1,000 mg  Status:  Discontinued        1,000 mg Oral  Once 12/30/23 0915 12/30/23 1138       Assessment/Plan:  Patient post-op day 6 and 2 from ex-lap, right colectomy, anastamotic leak s/p resection with ileostomy and mucous fistula placement.   N: Continue PRN pain medication, okay to give PO meds, would add tylenol  P: Extubated yesterday, has one prong of [place] in nose, SpO2 appropriate C:amiodarone for a. Fib, a. Fib likely secondary to leak and intra-abdominal sepsis GI: Okay to remove NGT today and give sips, would not start on CLD. Continue TPN  RFEN: Continue LR, while NPO  ID: Zosyn for 5 days, unsure if fluconazole is helpful at all given this was very distal but will defer to PCCM on abx. 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   PT/OT consult, he MUST get out of bed today   Baker Pierini, M.D. [facility]

## 2024-01-06 NOTE — Plan of Care (Signed)

## 2024-01-06 NOTE — Progress Notes (Addendum)
 NAME:  Alex Desantis., MRN:  [id number], DOB:  Jul 07, 1946, LOS: 8 ADMISSION DATE:  12/29/2023, CONSULTATION DATE: 01/04/2024 REFERRING MD: , CHIEF COMPLAINT: Dr. 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 Taken for ex-lap 01/04/24 and noted to have breakdown of the anastomosis with contaminated peritoneum. S/p resection with mucosal pouch and end ileostomy. Admitted to the ICU for post op care, remained intubated due to metabolic acidosis and multiple metabolic derangements.   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 Donneta Romberg.  He presented to [facility] ER on 02/28 from the cancer center for possible bowel obstruction.  Per ER notes the pt c/o generalized abdominal pain along with nausea/vomiting, onset several days prior to presentation.  He also had poor po intake.    ED Course In the ER significant lab results were: Na+ 130/K+ 3.2/chloride 91/glucose 194/BUN 29/wbc 3.8/hgb 10.7.  CT Abd/Pelvis concerning for worsening obstruction in the proximal transverse colon secondary to the known adenocarcinoma and possible nonspecific enterocolitis.  It was reported the pt did have a bowel movement prior to arrival to the ER.  Medications received in the ER were: 1L IV fluid bolus/zofrain/morphine.  General Surgery consulted and recommended hospital admission and to keep pt NPO for potential procedure on 02/29.  Pt admitted to the medsurg unit per hospitalist team.  See detailed hospital course below under significant events.  CT Abd/Pelvis W Contrast: Worsening obstruction in the proximal transverse colon secondary to the known adenocarcinoma compared with CT 10/14/2023. Increased wall thickening of the cecum and ascending  colon compared to 10/14/2023. There are few additional segments of wall thickening in the mid and distal small bowel. Findings are favored to represent nonspecific enterocolitis. Question mild diverticulitis in the descending colon. Aortic Atherosclerosis (ICD10-I70.0).  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  Degenerative Joint Disease of Lumbar Spine GERD  HLD Benign HTN  Kidney Stones Hypercholesterolemia  Type II Diabetes Mellitis  CKD Stage II   Micro Data:  3/6: MRSA PCR>>negative  Anti-infectives (From admission, onward)    Start     Dose/Rate Route Frequency Ordered Stop   01/05/24 2000  fluconazole (DIFLUCAN) IVPB 400 mg        400 mg 100 mL/hr over 120 Minutes Intravenous Every 24 hours 01/04/24 1859     01/04/24 2000  fluconazole (DIFLUCAN) IVPB 800 mg        800 mg 200 mL/hr over 120 Minutes Intravenous  Once 01/04/24 1859 01/04/24 2216   01/04/24 1400  piperacillin-tazobactam (ZOSYN) IVPB 3.375 g        3.375 g 12.5 mL/hr over 240 Minutes Intravenous Every 8 hours 01/04/24 1233     01/01/24 0600  piperacillin-tazobactam (ZOSYN) IVPB 3.375 g        3.375 g 12.5 mL/hr over 240 Minutes Intravenous Every 8 hours 12/31/23 1427 01/02/24 0945   12/31/23 2200  metroNIDAZOLE (FLAGYL) tablet 1,000 mg  Status:  Discontinued        1,000 mg Oral  Once 12/30/23 1138 12/30/23 1832   12/31/23 2200  neomycin (MYCIFRADIN) tablet 1,000 mg  Status:  Discontinued  1,000 mg Oral  Once 12/30/23 1138 12/30/23 1832   12/31/23 1500  metroNIDAZOLE (FLAGYL) tablet 1,000 mg  Status:  Discontinued        1,000 mg Oral  Once 12/30/23 1138 12/30/23 1832   12/31/23 1500  neomycin (MYCIFRADIN) tablet 1,000 mg  Status:  Discontinued        1,000 mg Oral  Once 12/30/23 1138 12/30/23 1832   12/31/23 1400  metroNIDAZOLE (FLAGYL) tablet 1,000 mg  Status:   Discontinued        1,000 mg Oral  Once 12/30/23 1138 12/30/23 1832   12/31/23 1400  neomycin (MYCIFRADIN) tablet 1,000 mg  Status:  Discontinued        1,000 mg Oral  Once 12/30/23 1138 12/30/23 1832   12/31/23 0800  ertapenem (INVANZ) 1 g in sodium chloride 0.9 % 100 mL IVPB        1 g 200 mL/hr over 30 Minutes Intravenous  Once 12/30/23 1832 12/31/23 0858   12/30/23 2200  neomycin (MYCIFRADIN) tablet 1,000 mg  Status:  Discontinued        1,000 mg Oral  Once 12/30/23 0915 12/30/23 1138   12/30/23 2200  metroNIDAZOLE (FLAGYL) tablet 1,000 mg  Status:  Discontinued        1,000 mg Oral  Once 12/30/23 0915 12/30/23 1138   12/30/23 1500  neomycin (MYCIFRADIN) tablet 1,000 mg  Status:  Discontinued        1,000 mg Oral  Once 12/30/23 0915 12/30/23 1138   12/30/23 1500  metroNIDAZOLE (FLAGYL) tablet 1,000 mg  Status:  Discontinued        1,000 mg Oral  Once 12/30/23 0915 12/30/23 1138   12/30/23 1400  neomycin (MYCIFRADIN) tablet 1,000 mg  Status:  Discontinued        1,000 mg Oral  Once 12/30/23 0915 12/30/23 1138   12/30/23 1400  metroNIDAZOLE (FLAGYL) tablet 1,000 mg  Status:  Discontinued        1,000 mg Oral  Once 12/30/23 0915 12/30/23 1138      Significant Hospital Events: Including procedures, antibiotic start and stop dates in addition to other pertinent events   02/28: Admitted to the medsurg unit with obstructing colon cancer  03/01: General Surgery consulted with recommendations to try a slow bowel prep over 2 days in anticipation of surgery.  However, following ingestion of a quarter of the full prep pt developed worsening abdominal distension.  KUB concerning for dilated loops of small bowel concerning for obstruction.  NGT placed by general surgery for decompression with plans to take pt to the OR on 03/2 for exploratory laparotomy and right colectomy. 03/02: Pt underwent exploratory laparotomy right hemicolectomy with primary anastomosis per general surgery  03/04: NGT removed  pt had bowel movement and continued to pass flatus.  However, upon general surgery examination pt noted to be slightly distended.  Pathology report consistent with T3 N0 colon cancer all margins negative  03/05: Palliative Care consulted to address goals of treatments: decision to continue full scope of care  03/06: Rapid response initiated due to pt developing hypotension and atrial fibrillation with rvr.  Pt also cold/clammy with abdominal pain and worsening distention.  He received 1L IV fluid bolus and amiodarone gtt initiated, however he remained hypotensive requiring transfer to ICU.  Cardiology consulted.  CT Abd/Pelvis finding consistent with a leak.  Pt taken back to the OR for exploratory lap requiring anastomotic resection; end ileostomy; and fistula. Postop pt remained mechanically intubated  03/07:  No significant events overnight. Remains on vent, on minimal vent settings (28% FiO2 & 5 PEEP).  Will plan for WUA and SBT as tolerated, optimistic for extubation. Shock, sepsis, and lactic acidosis improving, weaned to 3 mcg Levophed, vasopressin weaned off. 03/08: Remains on high flow nasal cannula at 6 L, hemodynamically stable.  Abdominal incision with intact dressing.  Fluctuating heart rate at the beginning of the shift but now stable  Interim History / Subjective:  -No significant events noted overnight -Afebrile, off pressors.  Blood pressure stable on maintenance IV fluids. -Respiratory status is stable on high flow nasal cannula at 6 L -Echocardiogram shows a left ventricular ejection fraction of 55 to 60%, ventricular hypertrophy and grade 1 diastolic dysfunction. -Patient out of bed to chair -Developed A-fib with RVR this morning requiring an amnio bolus.  Heart rate improved and stable  Objective   Blood pressure (!) 107/57, pulse 83, temperature 98.5 F (36.9 C), temperature source Oral, resp. rate (!) 28, height 6\' 1"  (1.854 m), weight 68.1 kg, SpO2 98%. CVP:  [6 mmHg-8 mmHg] 8  mmHg      Intake/Output Summary (Last 24 hours) at 01/06/2024 1358 Last data filed at 01/06/2024 1020 Gross per 24 hour  Intake 2218.17 ml  Output 1730 ml  Net 488.17 ml   Filed Weights   12/29/23 1418 12/30/23 0028 01/05/24 0420  Weight: 70.8 kg 69.2 kg 68.1 kg    Examination: General: Acutely on chronically ill appearing male, awake and responding to questions appropriately, NAD, on high flow nasal cannula HENT: Supple, no JVD  Lungs: Normal work of breathing, bilateral breath sounds, diminished in the bases, no wheezes, diffuse rhonchi in all lung fields. Cardiovascular: NSR, s1s2, no m/r/g, 1+ radial/1+ distal pulses, no edema  Abdomen: Persistent pungent odor from abdominal wounds, no audible BS, non distended, RLQ end ileostomy, LUQ mucous fistula, right-sided JP drain with serosanguinous output, midline abdominal incision clean dry and intact,  See image below   Extremities: +2 pulses, No edema Neuro: Alert and oriented x 2, speech is normal, able to follow commands GU: Indwelling foley catheter draining dark yellow urine   Resolved Hospital Problem list    #Mild hyponatremia   #Metabolic acidosis  #Lactic acidosis   Assessment & Plan:  #Acute toxic metabolic encephalopathy  #Postop pain  #Mechanical intubation pain/discomfort  Hx: Moderate lewy body dementia  -Continue IV fluids and trend CBC and lactic acid -Monitor respiratory status closely postextubation -Continue supplemental oxygen and titrate to maintain 2>92% -Patient is at risk for ICU delirium given baseline history of dementia.  Continue to oriented to environment and avoid sedating medications.  #Circulatory shock: Septic #New onset atrial fibrillation with rvr ~ CONVERTED TO NSR.   #Mildly elevated troponin suspect secondary to demand ischemia  Hx: HTN, HLD, and hypercholesterolemia  Echocardiogram 01/04/24: EF of 45 to 55%, no acute changes -Continuous cardiac monitoring -Maintain MAP >65 -IV  fluids -Hemodynamic monitoring and restart vasopressors if needed to maintain MAP goal -Trend lactic acid until normalized  -Trend HS Troponin until peaked -Continue Amiodarone gtt -Resume heparin gtt once general surgery ok with resuming  -Amiodarone 150 mg IV bolus x 1 given -Amiodarone as needed for heart rate greater than 130 bpm -Cardiology following  #CKD stage II-creatinine -Monitor I&O's / urinary output -Serial BMP -Avoid nephrotoxic agents as able -Replace electrolytes as indicated ~ Pharmacy following for assistance with electrolyte replacement -IV fluids  #Large bowel obstruction secondary to colon cancer cancer s/p hemicolectomy 03/2 #Anastomotic leak  s/p ex-lap anastomotic resection, end ileostomy, and mucous fistula -General surgery following, appreciate input  -NGT to LIS  -Keep NPO for now  -Per general surgery will initiate TPN on 03/07 -Continue to encourage ambulation as tolerated -PT OT evaluation  #Sepsis  #Intraabdominal infection  - Trend WBC and monitor fever curve  - Trend PCT  - Continue zosyn (plan for 5 day course per general surgery recommendations), along with Diflucan  #Chronic iron deficiency anemia-hemoglobin 7.7 today #Acute blood loss anemia secondary to surgery  -Monitor for S/Sx of bleeding -Trend CBC and transfuse for hemoglobin -SCD's for VTE Prophylaxis   #Type II diabetes mellitus  -CBG's q4hrs  -SSI  -Target CBG range 140 to 180  -Follow hyper/hypoglycemic protocol     Best Practice (right click and "Reselect all SmartList Selections" daily)   Diet/type: NPO; per General Surgery will start TPN on 01/05/24 DVT prophylaxis SCD (resume Heparin gtt when cleared by Surgery) Pressure ulcer(s): N/A GI prophylaxis: PPI Lines: Central line and is still needed Foley:  Yes, and it is still needed Code Status:  full code Last date of multidisciplinary goals of care discussion with family at bedside 01/06/2024  Labs   CBC: Recent  Labs  Lab 12/31/23 0732 12/31/23 0738 01/03/24 0436 01/04/24 0430 01/04/24 1805 01/05/24 0303 01/05/24 1508 01/06/24 0420  WBC 3.2*   < > 9.5 4.2 21.5* 16.0*  --  13.0*  NEUTROABS 1.8  --   --   --  20.1*  --   --   --   HGB 9.3*   < > 8.9* 10.5* 10.0* 9.2* 7.9* 7.7*  HCT 28.5*   < > 27.2* 31.3* 30.3* 27.4* 24.0* 22.6*  MCV 86.6   < > 84.7 83.2 84.9 84.0  --  82.2  PLT 240   < > 284 374 344 294  --  156   < > = values in this interval not displayed.    Basic Metabolic Panel: Recent Labs  Lab 01/01/24 0440 01/02/24 0422 01/03/24 0436 01/04/24 0430 01/04/24 1805 01/04/24 2326 01/05/24 0303 01/06/24 0420  NA 137 140   < > 134* 133* 132* 135 133*  K 4.2 4.2   < > 4.5 5.2* 4.7 4.7 3.9  CL 106 106   < > 98 100 100 101 103  CO2 23 25   < > 24 22 22 23 24   GLUCOSE 136* 119*   < > 169* 216* 189* 164* 157*  BUN 24* 23   < > 25* 36* 38* 39* 46*  CREATININE 0.87 0.69   < > 0.89 1.59* 1.61* 1.72* 1.37*  CALCIUM 7.6* 7.8*   < > 8.2* 7.1* 7.1* 7.2* 6.8*  MG 2.0 2.2  --  2.0  --   --  1.8 2.2  PHOS 2.6 2.6  --  4.1  --   --  5.6* 4.6   < > = values in this interval not displayed.   GFR: Estimated Creatinine Clearance: 43.5 mL/min (A) (by C-G formula based on SCr of 1.37 mg/dL (H)). Recent Labs  Lab 01/04/24 0430 01/04/24 0857 01/04/24 1805 01/04/24 1808 01/04/24 2326 01/05/24 0303 01/05/24 0409 01/05/24 1508 01/05/24 1819 01/06/24 0420  PROCALCITON  --   --  16.76  --   --   --   --   --   --   --   WBC 4.2  --  21.5*  --   --  16.0*  --   --   --  13.0*  LATICACIDVEN  --    < >  --    < > 3.0*  --  2.0* 1.5 1.2  --    < > = values in this interval not displayed.    Liver Function Tests: Recent Labs  Lab 01/01/24 0440 01/04/24 1805 01/05/24 0303 01/06/24 0420  AST  --  104* 93*  --   ALT  --  45* 42  --   ALKPHOS  --  58 52  --   BILITOT  --  0.6 0.7  --   PROT  --  4.1* 3.9*  --   ALBUMIN 2.3* 1.8* 1.6* <1.5*   No results for input(s): "LIPASE", "AMYLASE"  in the last 168 hours.  No results for input(s): "AMMONIA" in the last 168 hours.  ABG    Component Value Date/Time   PHART 7.43 01/04/2024 2352   PCO2ART 32 01/04/2024 2352   PO2ART 135 (H) 01/04/2024 2352   HCO3 21.2 01/04/2024 2352   TCO2 23 12/31/2023 0738   ACIDBASEDEF 2.3 (H) 01/04/2024 2352   O2SAT 99.6 01/04/2024 2352     Coagulation Profile: Recent Labs  Lab 01/04/24 1805  INR 1.5*    Cardiac Enzymes: No results for input(s): "CKTOTAL", "CKMB", "CKMBINDEX", "TROPONINI" in the last 168 hours.  HbA1C: Hgb A1c MFr Bld  Date/Time Value Ref Range Status  12/30/2023 06:07 AM 6.9 (H) 4.8 - 5.6 % Final    Comment:    (NOTE) Pre diabetes:          5.7%-6.4%  Diabetes:              >6.4%  Glycemic control for   <7.0% adults with diabetes   06/15/2023 01:48 PM 7.6 (H) 4.8 - 5.6 % Final    Comment:    (NOTE)         Prediabetes: 5.7 - 6.4         Diabetes: >6.4         Glycemic control for adults with diabetes: <7.0     CBG: Recent Labs  Lab 01/05/24 1525 01/05/24 1928 01/05/24 2353 01/06/24 0728 01/06/24 1132  GLUCAP 108* 146* 163* 161* 154*    Review of Systems:   Unable to assess pt mechanically intubated   Past Medical History:  He,  has a past medical history of Anemia, Atherosclerosis of abdominal aorta (HCC), Blind right eye, Colon cancer (HCC), Dementia (HCC), 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   Surgical History:   Past Surgical History:  Procedure Laterality Date   AMPUTATION TOE Right 03/01/2020   Procedure: AMPUTATION TOE;  Surgeon: [name], DPM;  Location: [facility] ORS;   Service: Podiatry;  Laterality: Right;   AMPUTATION TOE Right 01/03/2023   Procedure: AMPUTATION TOE, 2nd right;  Surgeon: [name], DPM;  Location: [facility];  Service: Podiatry;  Laterality: Right;   APPENDECTOMY     BIOPSY  12/20/2023   Procedure: BIOPSY;  Surgeon: Norma Fredrickson, Boykin Nearing, MD;  Location[facility];  Service: Gastroenterology;;   BONE EXCISION Right 01/03/2023   Procedure: Exostectomy interphalangeal joint right great toe;  Surgeon: [name], DPM;  Location: [facility] OR[facility];  Service: Podiatry;  Laterality: Right;   BOWEL RESECTION N/A 01/04/2024  Procedure: RESECTION OF ANASTOMOSIS, TAKE DOWN SPLENIC FLEXTURE;  Surgeon: Kandis Cocking, MD;  Location: [facility];  Service: General;  Laterality: N/A;   CATARACT EXTRACTION W/PHACO Left 09/06/2021   Procedure: CATARACT EXTRACTION PHACO AND INTRAOCULAR LENS PLACEMENT (IOC) LEFT 2.71 00:30.6;  Surgeon: Nevada Crane, MD;  Location: [facility];  Service: Ophthalmology;  Laterality: Left;  Diabetic   COLONOSCOPY WITH PROPOFOL N/A 12/20/2023   Procedure: COLONOSCOPY WITH PROPOFOL;  Surgeon: Toledo, Boykin Nearing, MD;  Location: [facility] ENDOSCOPY;  Service: Gastroenterology;  Laterality: N/A;   COLOSTOMY N/A 01/04/2024   Procedure: CREATION, END ILEOSTOMY AND MUCOUS FISTULA;  Surgeon: Kandis Cocking, MD;  Location: [facility];  Service: General;  Laterality: N/A;   ESOPHAGOGASTRODUODENOSCOPY (EGD) WITH PROPOFOL N/A 12/20/2023   Procedure: ESOPHAGOGASTRODUODENOSCOPY (EGD) WITH PROPOFOL;  Surgeon: Toledo, Boykin Nearing, MD;  Location: [facility] ENDOSCOPY;  Service: Gastroenterology;  Laterality: N/A;   EYE SURGERY     age 77   HERNIA REPAIR     INSERTION OF MESH  06/23/2023   Procedure: INSERTION OF MESH;  Surgeon: Sung Amabile, DO;  Location: [facility];  Service: General;;   KIDNEY STONE SURGERY     LAPAROTOMY Right 12/31/2023   Procedure: EXPLORATORY LAPAROTOMY WITH RIGHT COLECTOMY;  Surgeon: Kandis Cocking, MD;  Location: [facility];   Service: General;  Laterality: Right;   LAPAROTOMY N/A 01/04/2024   Procedure: LAPAROTOMY, EXPLORATORY;  Surgeon: Kandis Cocking, MD;  Location: [facility];  Service: General;  Laterality: N/A;   REMOVAL RETAINED LENS Right 04/19/2021   Procedure: CATARACT EXTRACTION PHACO,  RIGHT VISION BLUE 0.20 00:01.4 case aborted;  Surgeon: Nevada Crane, MD;  Location: [facility];  Service: Ophthalmology;  Laterality: Right;  Diabetic - oral meds   SHOULDER ARTHROSCOPY WITH BICEPS TENDON REPAIR Right 10/06/2016   Procedure: SHOULDER ARTHROSCOPY WITH BICEPS TENDON REPAIR;  Surgeon: Christena Flake, MD;  Location: [facility];  Service: Orthopedics;  Laterality: Right;   SHOULDER ARTHROSCOPY WITH OPEN ROTATOR CUFF REPAIR Right 10/06/2016   Procedure: SHOULDER ARTHROSCOPY WITH OPEN ROTATOR CUFF REPAIR;  Surgeon: Christena Flake, MD;  Location: [facility];  Service: Orthopedics;  Laterality: Right;   SHOULDER ARTHROSCOPY WITH SUBACROMIAL DECOMPRESSION Right 10/06/2016   Procedure: SHOULDER ARTHROSCOPY WITH SUBACROMIAL DECOMPRESSION AND DEBRIDEMENT;  Surgeon: Christena Flake, MD;  Location: [facility];  Service: Orthopedics;  Laterality: Right;   SUBMUCOSAL TATTOO INJECTION  12/20/2023   Procedure: SUBMUCOSAL TATTOO INJECTION;  Surgeon: Toledo, Boykin Nearing, MD;  Location: [facility] ENDOSCOPY;  Service: Gastroenterology;;   TONSILLECTOMY       Social History:   reports that he quit smoking about 54 years ago. His smoking use included cigarettes. He started smoking about 64 years ago. He has a 2.5 pack-year smoking history. He has been exposed to tobacco smoke. He has quit using smokeless tobacco. He reports that he does not drink alcohol and does not use drugs.   Family History:  His Family history is unknown by patient.   Allergies No Known Allergies   Home Medications  Prior to Admission medications   Medication Sig Start Date End Date Taking? Authorizing Provider  aspirin 81 MG tablet Take 81 mg by mouth daily.    Yes [provider]  atorvastatin (LIPITOR) 80 MG tablet Take 80 mg by mouth daily.   Yes [provider]  Cholecalciferol (VITAMIN D3) 50 MCG (2000 UT) TABS Take 1 tablet by mouth daily.   Yes [provider]  cyanocobalamin (VITAMIN B12)  1000 MCG tablet Take 1,000 mcg by mouth daily.   Yes [provider]  ferrous sulfate 325 (65 FE) MG tablet Take 325 mg by mouth 2 (two) times daily with a meal.   Yes [provider]  glipiZIDE (GLUCOTROL) 5 MG tablet Take 5 mg by mouth daily.   Yes [provider]  losartan-hydrochlorothiazide (HYZAAR) 100-12.5 MG tablet Take 1 tablet by mouth daily.   Yes [provider]  metFORMIN (GLUCOPHAGE) 500 MG tablet Take 500 mg by mouth 2 (two) times daily with a meal.   Yes [provider]  pioglitazone (ACTOS) 15 MG tablet Take 15 mg by mouth daily.   Yes [provider]  rivastigmine (EXELON) 3 MG capsule Take 3 mg by mouth 2 (two) times daily.   Yes [provider]     Critical care time: 40 minutes    Katie Moch S. [facility] ANP-BC Pulmonary and Critical Care Medicine [facility] Pager [phone] or [phone]  NB: This document was prepared using Dragon voice recognition software and may include unintentional dictation errors.

## 2024-01-07 DIAGNOSIS — K56609 Unspecified intestinal obstruction, unspecified as to partial versus complete obstruction: Secondary | ICD-10-CM | POA: Diagnosis not present

## 2024-01-07 LAB — RENAL FUNCTION PANEL
Albumin: <1.5 g/dL — ABNORMAL LOW (ref 3.5–5.0)
Anion gap: 5 (ref 5–15)
BUN: 52 mg/dL — ABNORMAL HIGH (ref 8–23)
CO2: 24 mmol/L (ref 22–32)
Calcium: 6.8 mg/dL — ABNORMAL LOW (ref 8.9–10.3)
Chloride: 103 mmol/L (ref 98–111)
Creatinine, Ser: 1.09 mg/dL (ref 0.61–1.24)
GFR, Estimated: >60 mL/min (ref >60)
Glucose, Bld: 254 mg/dL — ABNORMAL HIGH (ref 70–99)
Phosphorus: 3.2 mg/dL (ref 2.5–4.6)
Potassium: 3.8 mmol/L (ref 3.5–5.1)
Sodium: 132 mmol/L — ABNORMAL LOW (ref 135–145)

## 2024-01-07 LAB — CBC
HCT: 21.4 % — ABNORMAL LOW (ref 39.0–52.0)
Hemoglobin: 7.3 g/dL — ABNORMAL LOW (ref 13.0–17.0)
MCH: 28.5 pg (ref 26.0–34.0)
MCHC: 34.1 g/dL (ref 30.0–36.0)
MCV: 83.6 fL (ref 80.0–100.0)
Platelets: 116 10*3/uL — ABNORMAL LOW (ref 150–400)
RBC: 2.56 MIL/uL — ABNORMAL LOW (ref 4.22–5.81)
RDW: 16.1 % — ABNORMAL HIGH (ref 11.5–15.5)
WBC: 9.5 10*3/uL (ref 4.0–10.5)
nRBC: 0 % (ref 0.0–0.2)

## 2024-01-07 LAB — GLUCOSE, CAPILLARY
Glucose-Capillary: 204 mg/dL — ABNORMAL HIGH (ref 70–99)
Glucose-Capillary: 212 mg/dL — ABNORMAL HIGH (ref 70–99)
Glucose-Capillary: 212 mg/dL — ABNORMAL HIGH (ref 70–99)
Glucose-Capillary: 216 mg/dL — ABNORMAL HIGH (ref 70–99)
Glucose-Capillary: 225 mg/dL — ABNORMAL HIGH (ref 70–99)
Glucose-Capillary: 236 mg/dL — ABNORMAL HIGH (ref 70–99)

## 2024-01-07 MED ORDER — TRACE MINERALS CU-MN-SE-ZN 300-55-60-3000 MCG/ML IV SOLN
INTRAVENOUS | Status: AC
  Filled 2024-01-07: qty 2000

## 2024-01-07 MED ORDER — FAT EMUL FISH OIL/PLANT BASED 20% (SMOFLIPID)IV EMUL
250.0000 mL | INTRAVENOUS | Status: AC
  Administered 2024-01-07: 250 mL via INTRAVENOUS
  Filled 2024-01-07: qty 250

## 2024-01-07 NOTE — Evaluation (Signed)
 Physical Therapy Evaluation Patient Details Name: Alex CLOKE Sr. MRN: [id number] DOB: [birth year] Today's Date: 01/07/2024  History of Present Illness  Pt is a 78 yo s/p exploratory laparatomy and colectomy presenting with abdominal pain and sepsis found to have colonic obstruction and leak on abdominal CT. PMH of DM, dementia, colon cancer  Clinical Impression  Pt presenting with changes in status due to requiring ICU level care. The pt is reporting high levels of pain which limit tolerance to all mobility at this time. Prior to this session the patient was dependent for achieving EOB, however this session was able to achieve EOB with Max A. The pt will continue to require skilled PT in order to optimize functional mobility and return towards baseline level of function.       If plan is discharge home, recommend the following: Assist for transportation;Direct supervision/assist for financial management;Assistance with cooking/housework;Help with stairs or ramp for entrance;Two people to help with walking and/or transfers;Two people to help with bathing/dressing/bathroom   Can travel by private vehicle   No    Equipment Recommendations None recommended by PT  Recommendations for Other Services       Functional Status Assessment Patient has had a recent decline in their functional status and demonstrates the ability to make significant improvements in function in a reasonable and predictable amount of time.     Precautions / Restrictions Precautions Precautions: Fall Restrictions Weight Bearing Restrictions Per Provider Order: No      Mobility  Bed Mobility Overal bed mobility: Needs Assistance Bed Mobility: Supine to Sit, Sit to Supine     Supine to sit: Max assist, HOB elevated Sit to supine: Total assist, HOB elevated        Transfers                        Ambulation/Gait                  Stairs            Wheelchair Mobility     Tilt  Bed    Modified Rankin (Stroke Patients Only)       Balance Overall balance assessment: Needs assistance Sitting-balance support: Feet supported Sitting balance-Leahy Scale: Poor                                       Pertinent Vitals/Pain Pain Assessment Pain Assessment: Faces Faces Pain Scale: Hurts even more Pain Intervention(s): Limited activity within patient's tolerance, Monitored during session    Home Living Family/patient expects to be discharged to:: Skilled nursing facility Living Arrangements: Spouse/significant other;Children Available Help at Discharge: Family;Available 24 hours/day Type of Home: House           Home Equipment: Agricultural consultant (2 wheels);Hand held shower head Additional Comments: Information taken from previous note    Prior Function Prior Level of Function : Independent/Modified Independent             Mobility Comments: MOD I/IND ADLs Comments: IND with ADLs, family assisted with all IADLs and drove him to appts, etc.     Extremity/Trunk Assessment   Upper Extremity Assessment Upper Extremity Assessment: Overall WFL for tasks assessed    Lower Extremity Assessment Lower Extremity Assessment: Generalized weakness (pain with mobility of either LE)       Communication        Cognition  Arousal: Lethargic Behavior During Therapy: WFL for tasks assessed/performed   PT - Cognitive impairments: History of cognitive impairments                           Following commands impaired: Only follows one step commands consistently     Cueing       General Comments      Exercises     Assessment/Plan    PT Assessment Patient needs continued PT services  PT Problem List Decreased strength;Decreased activity tolerance;Decreased balance;Decreased mobility;Decreased knowledge of precautions;Decreased knowledge of use of DME;Pain       PT Treatment Interventions DME instruction;Balance training;Gait  training;Neuromuscular re-education;Stair training;Functional mobility training;Therapeutic activities;Therapeutic exercise;Patient/family education    PT Goals (Current goals can be found in the Care Plan section)  Acute Rehab PT Goals Patient Stated Goal: to have less pain PT Goal Formulation: With patient Time For Goal Achievement: 01/21/24 Potential to Achieve Goals: Good    Frequency Min 2X/week     Co-evaluation               AM-PAC PT "6 Clicks" Mobility  Outcome Measure Help needed turning from your back to your side while in a flat bed without using bedrails?: A Lot Help needed moving from lying on your back to sitting on the side of a flat bed without using bedrails?: A Lot Help needed moving to and from a bed to a chair (including a wheelchair)?: Total Help needed standing up from a chair using your arms (e.g., wheelchair or bedside chair)?: Total Help needed to walk in hospital room?: Total Help needed climbing 3-5 steps with a railing? : Total 6 Click Score: 8    End of Session Equipment Utilized During Treatment: Oxygen Activity Tolerance: Patient limited by pain Patient left: with call bell/phone within reach;in bed;with family/visitor present Nurse Communication: Mobility status PT Visit Diagnosis: Other abnormalities of gait and mobility (R26.89);Difficulty in walking, not elsewhere classified (R26.2);Muscle weakness (generalized) (M62.81);Pain    Time: 1228-1250 PT Time Calculation (min) (ACUTE ONLY): 22 min   Charges:   PT Evaluation $PT Eval Moderate Complexity: 1 Mod PT Treatments $Therapeutic Activity: 8-22 mins PT General Charges $$ ACUTE PT VISIT: 1 Visit         1:33 PM, 01/07/24 Alex Soto PT, DPT Physical Therapist - [facility]   Alex Soto 01/07/2024, 1:31 PM

## 2024-01-07 NOTE — Progress Notes (Signed)
 Patient ID: Alex Soto., male   DOB: 1946-07-19, 78 y.o.   MRN: [id number] [facility]    SUBJECTIVE: Extubated fatigue weak responding to verbal stimuli and pain stimuli.  Still has significant abdominal discomfort resting comfortably   Vitals:   01/07/24 1250 01/07/24 1300 01/07/24 1400 01/07/24 1500  BP: (!) 128/51 (!) 111/51 (!) 118/51 (!) 121/56  Pulse: 72 72 70   Resp: (!) 22 15 18 18   Temp:      TempSrc:      SpO2: 95% 96% 97%   Weight:      Height:         Intake/Output Summary (Last 24 hours) at 01/07/2024 1637 Last data filed at 01/07/2024 1914 Gross per 24 hour  Intake --  Output 830 ml  Net -830 ml      PHYSICAL EXAM  General: Well developed, well nourished, in no acute distress HEENT:  Normocephalic and atramatic Neck:  No JVD.  Lungs: Clear bilaterally to auscultation and percussion. Heart: HRRR . Normal S1 and S2 without gallops or murmurs.  Abdomen: Bowel sounds are positive, abdomen soft and non-tender  Msk:  Back normal, normal gait. Normal strength and tone for age. Extremities: No clubbing, cyanosis or edema.   Neuro: Alert and oriented X 3. Psych:  Good affect, responds appropriately   LABS: Basic Metabolic Panel: Recent Labs    01/05/24 0303 01/06/24 0420 01/07/24 0420  NA 135 133* 132*  K 4.7 3.9 3.8  CL 101 103 103  CO2 23 24 24   GLUCOSE 164* 157* 254*  BUN 39* 46* 52*  CREATININE 1.72* 1.37* 1.09  CALCIUM 7.2* 6.8* 6.8*  MG 1.8 2.2  --   PHOS 5.6* 4.6 3.2   Liver Function Tests: Recent Labs    01/04/24 1805 01/05/24 0303 01/06/24 0420 01/07/24 0420  AST 104* 93*  --   --   ALT 45* 42  --   --   ALKPHOS 58 52  --   --   BILITOT 0.6 0.7  --   --   PROT 4.1* 3.9*  --   --   ALBUMIN 1.8* 1.6* <1.5* <1.5*   No results for input(s): "LIPASE", "AMYLASE" in the last 72 hours. CBC: Recent Labs    01/04/24 1805 01/05/24 0303 01/06/24 0420 01/07/24 0420  WBC 21.5*   < > 13.0* 9.5  NEUTROABS 20.1*  --   --   --    HGB 10.0*   < > 7.7* 7.3*  HCT 30.3*   < > 22.6* 21.4*  MCV 84.9   < > 82.2 83.6  PLT 344   < > 156 116*   < > = values in this interval not displayed.   Cardiac Enzymes: No results for input(s): "CKTOTAL", "CKMB", "CKMBINDEX", "TROPONINI" in the last 72 hours. BNP: Invalid input(s): "POCBNP" D-Dimer: No results for input(s): "DDIMER" in the last 72 hours. Hemoglobin A1C: No results for input(s): "HGBA1C" in the last 72 hours. Fasting Lipid Panel: No results for input(s): "CHOL", "HDL", "LDLCALC", "TRIG", "CHOLHDL", "LDLDIRECT" in the last 72 hours. Thyroid Function Tests: No results for input(s): "TSH", "T4TOTAL", "T3FREE", "THYROIDAB" in the last 72 hours.  Invalid input(s): "FREET3" Anemia Panel: No results for input(s): "VITAMINB12", "FOLATE", "FERRITIN", "TIBC", "IRON", "RETICCTPCT" in the last 72 hours.  No results found.   Echo shows left ventricular function  TELEMETRY: Sinus rhythm rate of 80:  ASSESSMENT AND PLAN:  Principal Problem:   Large bowel obstruction (HCC) Active Problems:  HTN (hypertension), benign   Type 2 diabetes mellitus (HCC)   Malignant neoplasm of transverse colon (HCC)   Dementia with behavioral disturbance (HCC)   Electrolyte abnormality   Colon adenocarcinoma (HCC)   Palliative care encounter   Hypotension   Paroxysmal atrial fibrillation with RVR (HCC)   Protein-calorie malnutrition, severe   Septic shock (HCC)    Plan Postop from adenocarcinoma right hemicolectomy patient recovering well Respiratory failure s/p extubation with normal respiratory effort continue inhalers and supplemental oxygen as necessary Hypotension relatively continue fluid resuscitation low-grade pressures as necessary Sepsis continue broad-spectrum antibiotic therapy Paroxysmal atrial fibrillation treated with amiodarone rhythm converted to sinus continue heparin as necessary for anticoagulation Diabetes continue insulin sliding scale therapy as  necessary Acute on chronic renal insufficiency continue adequate hydration consider nephrology Continue conservative cardiac input we will transition to p.o. amnio soon   Alex Pea, MD 01/07/2024 4:37 PM

## 2024-01-07 NOTE — Progress Notes (Addendum)
**Note Alex-Identified via Obfuscation**  Progress Note    Alex HACKBART Sr.  YNW:[id number] DOB: 03/15/1946  DOA: 12/29/2023 PCP: [name], MD      Brief Narrative:    Medical records reviewed and are as summarized below:  Alex Burrs. is a 78 y.o. male with medical history significant for DM, HTN, Lewy body dementia, recently diagnosed adenocarcinoma of the colon on colonoscopy 12/20/2023 that was done for iron deficiency anemia and abdominal pain.  She saw her oncologist on 12/27/2023 to discuss therapy for newly diagnosed colon cancer.  He presented to the hospital with nausea, vomiting and worsening right lower quadrant abdominal pain.  He was found to have large bowel obstruction.  He underwent exploratory laparotomy with right hemicolectomy and primary anastomosis on 12/31/2023.  Hospital course was complicated by anastomotic leak.  He was taken back to the OR for resection with ileostomy and mucous fistula placement on 01/04/2024.  He was transferred to the ICU for postoperative care because he could not be extubated after surgery.  He required vasopressors for septic shock in the ICU.  He did follow-up to atrial fibrillation with RVR as well and required IV amiodarone drip.  He was eventually extubated and taken off of vasopressors.  He was subsequently transferred to the hospitalist service on 01/07/2024.     Assessment/Plan:   Principal Problem:   Large bowel obstruction (HCC) Active Problems:   HTN (hypertension), benign   Type 2 diabetes mellitus (HCC)   Malignant neoplasm of transverse colon (HCC)   Dementia with behavioral disturbance (HCC)   Electrolyte abnormality   Colon adenocarcinoma (HCC)   Palliative care encounter   Hypotension   Paroxysmal atrial fibrillation with RVR (HCC)   Protein-calorie malnutrition, severe   Septic shock (HCC)   Body mass index is 18.18 kg/m.   Large bowel obstruction, recently diagnosed transverse colon adenocarcinoma: S/p ex laparotomy, right  hemicolectomy with primary anastomosis on 12/31/2023.   S/p repair of anastomotic leak resection with ileostomy and mucous fistula placement on 01/04/2024 Continue empiric IV Zosyn and IV fluconazole He is on TPN for nutrition Analgesics as needed for pain. Discontinue IV fluids. Follow-up with general surgeon.   Acute postoperative anemia, chronic iron deficiency anemia:  Hemoglobin down to 7.3.  Continue to monitor. Probably from combination of blood loss from recent surgery from hemodilution from IV fluids.  No indication for blood transfusion at this time.  Monitor H&H. Ferritin was 10, iron sat duration ratio 17 and iron level 48 on 12/27/2023   Septic shock secondary to perforated colon/peritonitis: Continue empiric IV Zosyn and IV fluconazole. He is off of vasopressors.   Atrial fibrillation with RVR: Heart rate has improved.  He is on IV amiodarone drip.  Follow-up with cardiologist.   Postop ventilatory requirement: S/p liberation from mechanical ventilator.  Continue 3 L/min oxygen via [place].   AKI: Improved.  Discontinue IV fluids   Severe protein calorie malnutrition, severe hypoalbuminemia: Continue TPN   Hypokalemia: Improved. Hyponatremia: Stable.  Fluctuating sodium levels. Monitor electrolytes and replete as needed   Type II DM: NovoLog correction scale as needed for hyperglycemia   Hypertension: Home losartan-HCTZ has been held.   Lewy body dementia: No acute issues.   Diet Order             Diet NPO time specified Except for: Ice Chips, Other (See Comments), Sips with Meds  Diet effective now  Consultants: General surgeon Intensivist Oncologist Cardiologist Palliative care  Procedures: Exploratory laparotomy right hemicolectomy with primary anastomosis on 12/31/2023 Repair of anastomotic leak resection with ileostomy and mucous fistula placement on 01/04/2024    Medications:    Chlorhexidine Gluconate  Cloth  6 each Topical Daily   enoxaparin (LOVENOX) injection  40 mg Subcutaneous Q24H   insulin aspart  0-9 Units Subcutaneous Q4H   sodium chloride flush  10-40 mL Intracatheter Q12H   Continuous Infusions:  .TPN (CLINIMIX-E) Adult 84 mL/hr at 01/06/24 1822   .TPN (CLINIMIX-E) Adult     And   fat emul(SMOFlipid)     amiodarone 30 mg/hr (01/07/24 0548)   fluconazole (DIFLUCAN) IV 400 mg (01/06/24 1945)   lactated ringers 100 mL/hr at 01/06/24 0535   piperacillin-tazobactam (ZOSYN)  IV 3.375 g (01/07/24 0547)   vasopressin Stopped (01/05/24 0933)     Anti-infectives (From admission, onward)    Start     Dose/Rate Route Frequency Ordered Stop   01/05/24 2000  fluconazole (DIFLUCAN) IVPB 400 mg        400 mg 100 mL/hr over 120 Minutes Intravenous Every 24 hours 01/04/24 1859     01/04/24 2000  fluconazole (DIFLUCAN) IVPB 800 mg        800 mg 200 mL/hr over 120 Minutes Intravenous  Once 01/04/24 1859 01/04/24 2216   01/04/24 1400  piperacillin-tazobactam (ZOSYN) IVPB 3.375 g        3.375 g 12.5 mL/hr over 240 Minutes Intravenous Every 8 hours 01/04/24 1233     01/01/24 0600  piperacillin-tazobactam (ZOSYN) IVPB 3.375 g        3.375 g 12.5 mL/hr over 240 Minutes Intravenous Every 8 hours 12/31/23 1427 01/02/24 0945   12/31/23 2200  metroNIDAZOLE (FLAGYL) tablet 1,000 mg  Status:  Discontinued        1,000 mg Oral  Once 12/30/23 1138 12/30/23 1832   12/31/23 2200  neomycin (MYCIFRADIN) tablet 1,000 mg  Status:  Discontinued        1,000 mg Oral  Once 12/30/23 1138 12/30/23 1832   12/31/23 1500  metroNIDAZOLE (FLAGYL) tablet 1,000 mg  Status:  Discontinued        1,000 mg Oral  Once 12/30/23 1138 12/30/23 1832   12/31/23 1500  neomycin (MYCIFRADIN) tablet 1,000 mg  Status:  Discontinued        1,000 mg Oral  Once 12/30/23 1138 12/30/23 1832   12/31/23 1400  metroNIDAZOLE (FLAGYL) tablet 1,000 mg  Status:  Discontinued        1,000 mg Oral  Once 12/30/23 1138 12/30/23 1832    12/31/23 1400  neomycin (MYCIFRADIN) tablet 1,000 mg  Status:  Discontinued        1,000 mg Oral  Once 12/30/23 1138 12/30/23 1832   12/31/23 0800  ertapenem (INVANZ) 1 g in sodium chloride 0.9 % 100 mL IVPB        1 g 200 mL/hr over 30 Minutes Intravenous  Once 12/30/23 1832 12/31/23 0858   12/30/23 2200  neomycin (MYCIFRADIN) tablet 1,000 mg  Status:  Discontinued        1,000 mg Oral  Once 12/30/23 0915 12/30/23 1138   12/30/23 2200  metroNIDAZOLE (FLAGYL) tablet 1,000 mg  Status:  Discontinued        1,000 mg Oral  Once 12/30/23 0915 12/30/23 1138   12/30/23 1500  neomycin (MYCIFRADIN) tablet 1,000 mg  Status:  Discontinued        1,000 mg Oral  Once  12/30/23 0915 12/30/23 1138   12/30/23 1500  metroNIDAZOLE (FLAGYL) tablet 1,000 mg  Status:  Discontinued        1,000 mg Oral  Once 12/30/23 0915 12/30/23 1138   12/30/23 1400  neomycin (MYCIFRADIN) tablet 1,000 mg  Status:  Discontinued        1,000 mg Oral  Once 12/30/23 0915 12/30/23 1138   12/30/23 1400  metroNIDAZOLE (FLAGYL) tablet 1,000 mg  Status:  Discontinued        1,000 mg Oral  Once 12/30/23 0915 12/30/23 1138              Family Communication/Anticipated D/C date and plan/Code Status   DVT prophylaxis: enoxaparin (LOVENOX) injection 40 mg Start: 01/05/24 1000 SCDs Start: 12/29/23 2147     Code Status: Full Code  Family Communication: None Disposition Plan: Plan to discharge home   Status is: Inpatient Remains inpatient appropriate because: Bowel obstruction s/p surgery       Subjective:   Interval events noted.  He complains of abdominal pain.  He asked for something to drink.  Objective:    Vitals:   01/07/24 0900 01/07/24 1000 01/07/24 1100 01/07/24 1114  BP: (!) 116/50 (!) 123/52 (!) 140/62   Pulse: 75 69 74 73  Resp: 18 18 (!) 30 20  Temp:    98.1 F (36.7 C)  TempSrc:    Oral  SpO2: 95% 92% 94% 95%  Weight:      Height:       No data found.   Intake/Output Summary (Last 24  hours) at 01/07/2024 1227 Last data filed at 01/07/2024 5409 Gross per 24 hour  Intake --  Output 830 ml  Net -830 ml   Filed Weights   12/30/23 0028 01/05/24 0420 01/07/24 0425  Weight: 69.2 kg 68.1 kg 62.5 kg    Exam:  GEN: NAD SKIN: Warm and dry EYES: No pallor or icterus ENT: MMM CV: RRR PULM: CTA B ABD: soft, ND, surgical tenderness, dressing on midline abdominal surgical incision is wet but intact.  +BS, + colostomy end ileostomy  CNS: AAO x 2 (person and place), non focal EXT: No edema or tenderness       Data Reviewed:   I have personally reviewed following labs and imaging studies:  Labs: Labs show the following:   Basic Metabolic Panel: Recent Labs  Lab 01/01/24 0440 01/02/24 0422 01/03/24 0436 01/04/24 0430 01/04/24 1805 01/04/24 2326 01/05/24 0303 01/06/24 0420 01/07/24 0420  NA 137 140   < > 134* 133* 132* 135 133* 132*  K 4.2 4.2   < > 4.5 5.2* 4.7 4.7 3.9 3.8  CL 106 106   < > 98 100 100 101 103 103  CO2 23 25   < > 24 22 22 23 24 24   GLUCOSE 136* 119*   < > 169* 216* 189* 164* 157* 254*  BUN 24* 23   < > 25* 36* 38* 39* 46* 52*  CREATININE 0.87 0.69   < > 0.89 1.59* 1.61* 1.72* 1.37* 1.09  CALCIUM 7.6* 7.8*   < > 8.2* 7.1* 7.1* 7.2* 6.8* 6.8*  MG 2.0 2.2  --  2.0  --   --  1.8 2.2  --   PHOS 2.6 2.6  --  4.1  --   --  5.6* 4.6 3.2   < > = values in this interval not displayed.   GFR Estimated Creatinine Clearance: 50.2 mL/min (by C-G formula based on SCr of 1.09  mg/dL). Liver Function Tests: Recent Labs  Lab 01/01/24 0440 01/04/24 1805 01/05/24 0303 01/06/24 0420 01/07/24 0420  AST  --  104* 93*  --   --   ALT  --  45* 42  --   --   ALKPHOS  --  58 52  --   --   BILITOT  --  0.6 0.7  --   --   PROT  --  4.1* 3.9*  --   --   ALBUMIN 2.3* 1.8* 1.6* <1.5* <1.5*   No results for input(s): "LIPASE", "AMYLASE" in the last 168 hours.  No results for input(s): "AMMONIA" in the last 168 hours. Coagulation profile Recent Labs  Lab  01/04/24 1805  INR 1.5*    CBC: Recent Labs  Lab 01/04/24 0430 01/04/24 1805 01/05/24 0303 01/05/24 1508 01/06/24 0420 01/07/24 0420  WBC 4.2 21.5* 16.0*  --  13.0* 9.5  NEUTROABS  --  20.1*  --   --   --   --   HGB 10.5* 10.0* 9.2* 7.9* 7.7* 7.3*  HCT 31.3* 30.3* 27.4* 24.0* 22.6* 21.4*  MCV 83.2 84.9 84.0  --  82.2 83.6  PLT 374 344 294  --  156 116*   Cardiac Enzymes: No results for input(s): "CKTOTAL", "CKMB", "CKMBINDEX", "TROPONINI" in the last 168 hours. BNP (last 3 results) No results for input(s): "PROBNP" in the last 8760 hours. CBG: Recent Labs  Lab 01/06/24 1949 01/06/24 2322 01/07/24 0404 01/07/24 0757 01/07/24 1059  GLUCAP 195* 216* 225* 212* 216*   D-Dimer: No results for input(s): "DDIMER" in the last 72 hours. Hgb A1c: No results for input(s): "HGBA1C" in the last 72 hours.  Lipid Profile: No results for input(s): "CHOL", "HDL", "LDLCALC", "TRIG", "CHOLHDL", "LDLDIRECT" in the last 72 hours. Thyroid function studies: No results for input(s): "TSH", "T4TOTAL", "T3FREE", "THYROIDAB" in the last 72 hours.  Invalid input(s): "FREET3" Anemia work up: No results for input(s): "VITAMINB12", "FOLATE", "FERRITIN", "TIBC", "IRON", "RETICCTPCT" in the last 72 hours.  Sepsis Labs: Recent Labs  Lab 01/04/24 1805 01/04/24 1808 01/04/24 2326 01/05/24 0303 01/05/24 0409 01/05/24 1508 01/05/24 1819 01/06/24 0420 01/07/24 0420  PROCALCITON 16.76  --   --   --   --   --   --   --   --   WBC 21.5*  --   --  16.0*  --   --   --  13.0* 9.5  LATICACIDVEN  --    < > 3.0*  --  2.0* 1.5 1.2  --   --    < > = values in this interval not displayed.    Microbiology Recent Results (from the past 240 hours)  MRSA Next Gen by PCR, Nasal     Status: None   Collection Time: 01/04/24  9:25 AM   Specimen: Nasal Mucosa; Nasal Swab  Result Value Ref Range Status   MRSA by PCR Next Gen NOT DETECTED NOT DETECTED Final    Comment: (NOTE) The GeneXpert MRSA Assay  (FDA approved for NASAL specimens only), is one component of a comprehensive MRSA colonization surveillance program. It is not intended to diagnose MRSA infection nor to guide or monitor treatment for MRSA infections. Test performance is not FDA approved in patients less than 8 years old. Performed at [facility], [address] [place], [place] [address]     Procedures and diagnostic studies:  No results found.  LOS: 9 days   Morey Andonian  [employer]ered loss adjuster on [email or web address]. If 7PM-7AM, please contact night-coverage at [email or web address]     01/07/2024, 12:27 PM

## 2024-01-07 NOTE — Progress Notes (Signed)
 Patient with leaking ostomy. Removed ostomy, cleaned area, skin barrier used, new ostomy placed. Patient tolerated well. Pads under patient changed and patient cleaned.

## 2024-01-07 NOTE — Progress Notes (Signed)
 PHARMACY - TOTAL PARENTERAL NUTRITION CONSULT NOTE   Indication: without adequate nutrition for 7 days   Patient Measurements: Height: 6\' 1"  (185.4 cm) Weight: 62.5 kg (137 lb 12.6 oz) IBW/kg (Calculated) : 79.9 TPN AdjBW (KG): 69.2 Body mass index is 18.18 kg/m.  Assessment: 78 y/o male with h/o HTN, HLD, DM, dementia, diverticulitis, GERD, HLD, DJD and inguinal hernia s/p repair who is admitted with LBO secondary to colon mass (Stage IIa colon adenocarcinoma) s/p exploratory laparotomy with right hemicolectomy with primary anastomosis 3/2 complicated by post op ileus and new onset atrial fibrillation with RVR.   Glucose / Insulin: SSI required/previous 24h: 13 units Electrolytes: WNL Renal: SCr 0.66-->1.09 Hepatic: AST>ALT: improving Intake / Output;  03/08 0701 - 03/09 0700: -1.1L Net (+5.9L) GI Imaging: 03/06 CT pelvis: anastomotic leak, Atrophic pancreas without inflammation   GI Surgeries / Procedures:  S/p ex-lap, right colectomy, anastamotic leak s/p resection with ileostomy and mucous fistula placement.   Central access: 01/04/24 TPN start date: 01/05/24  RD Assessment: Estimated Needs Total Energy Estimated Needs: 2000-2300kcal/day Total Protein Estimated Needs: 100-115g/day Total Fluid Estimated Needs: 1.8-2.1L/day  Current Nutrition:  NPO  Plan:  ---Continue Clinimix E8/10 TPN at 61mL/hr at 1800 ---Electrolytes in TPN(standard): Na 31mEq/L, K 2mEq/L, Ca 4.76mEq/L, Mg 68mEq/L, and Phos 51mmol/L. Cl:Ac 0.49 ---nutritional components:  Dextrose 680 kcal (200.8 g) Protein 640 kcal (160 g)  Total Kcal: 1834 ---Add standard MVI, thiamine 100 mg and trace elements to TPN ---continue Sensitive q4h SSI and adjust as needed  ---2 grams IV magnesium sulfate x 1 ---Monitor TPN labs on Mon/Thurs, daily until stable  Leslye Puccini A Alexsus Papadopoulos 01/07/2024,10:44 AM

## 2024-01-07 NOTE — Progress Notes (Signed)
 Subjective:  CC: Alex Soto. is a 78 y.o. male  Hospital stay day 9, 3 Days Post-Op from ex-lap, right colectomy, anastamotic leak s/p resection with ileostomy and mucous fistula placement.   HPI: No acute events reported overnight.  Patient states abdominal pain still present.  ROS:  General: Denies weight loss, weight gain, fatigue, fevers, chills, and night sweats. Heart: Denies chest pain, palpitations, racing heart, irregular heartbeat, leg pain or swelling, and decreased activity tolerance. Respiratory: Denies breathing difficulty, shortness of breath, wheezing, cough, and sputum. GI: Denies change in appetite, heartburn, nausea, vomiting, constipation, diarrhea, and blood in stool. GU: Denies difficulty urinating, pain with urinating, urgency, frequency, blood in urine.   Objective:   Temp:  [97.9 F (36.6 C)-98.7 F (37.1 C)] 98.1 F (36.7 C) (03/09 1114) Pulse Rate:  [69-83] 81 (03/09 1200) Resp:  [15-32] 28 (03/09 1200) BP: (102-172)/(42-73) 128/51 (03/09 1200) SpO2:  [92 %-100 %] 96 % (03/09 1200) Weight:  [62.5 kg] 62.5 kg (03/09 0425)     Height: 6\' 1"  (185.4 cm) Weight: 62.5 kg BMI (Calculated): 18.18   Intake/Output this shift:   Intake/Output Summary (Last 24 hours) at 01/07/2024 1301 Last data filed at 01/07/2024 5284 Gross per 24 hour  Intake --  Output 830 ml  Net -830 ml    Constitutional :  alert, cooperative, appears stated age, and no distress  Respiratory:  clear to auscultation bilaterally  Cardiovascular:  regular rate and rhythm  Gastrointestinal: Soft, no guarding, midline wound clean.  Productive ostomy as well as some output noted from the mucous fistula as well .   Skin: Cool and moist.   Psychiatric: Normal affect, non-agitated, not confused       LABS:     Latest Ref Rng & Units 01/07/2024    4:20 AM 01/06/2024    4:20 AM 01/05/2024    3:03 AM  CMP  Glucose 70 - 99 mg/dL 132  440  102   BUN 8 - 23 mg/dL 52  46  39   Creatinine 0.61 -  1.24 mg/dL 7.25  3.66  4.40   Sodium 135 - 145 mmol/L 132  133  135   Potassium 3.5 - 5.1 mmol/L 3.8  3.9  4.7   Chloride 98 - 111 mmol/L 103  103  101   CO2 22 - 32 mmol/L 24  24  23    Calcium 8.9 - 10.3 mg/dL 6.8  6.8  7.2   Total Protein 6.5 - 8.1 g/dL   3.9   Total Bilirubin 0.0 - 1.2 mg/dL   0.7   Alkaline Phos 38 - 126 U/L   52   AST 15 - 41 U/L   93   ALT 0 - 44 U/L   42       Latest Ref Rng & Units 01/07/2024    4:20 AM 01/06/2024    4:20 AM 01/05/2024    3:08 PM  CBC  WBC 4.0 - 10.5 K/uL 9.5  13.0    Hemoglobin 13.0 - 17.0 g/dL 7.3  7.7  7.9   Hematocrit 39.0 - 52.0 % 21.4  22.6  24.0   Platelets 150 - 400 K/uL 116  156      RADS: N/a Assessment:   from ex-lap, right colectomy, anastamotic leak s/p resection with ileostomy and mucous fistula placement.   Overall stable from GI standpoint.  Although output is noted within both bags, patient overall still looks very frail, unsure if patient can tolerate  oral intake at this point.  Recommend continuing n.p.o. for 1 more day and continue TPN and reassess in the morning.  labs/images/medications/previous chart entries reviewed personally and relevant changes/updates noted above.

## 2024-01-08 ENCOUNTER — Other Ambulatory Visit: Payer: Self-pay

## 2024-01-08 DIAGNOSIS — K56609 Unspecified intestinal obstruction, unspecified as to partial versus complete obstruction: Secondary | ICD-10-CM | POA: Diagnosis not present

## 2024-01-08 DIAGNOSIS — F03918 Unspecified dementia, unspecified severity, with other behavioral disturbance: Secondary | ICD-10-CM | POA: Diagnosis not present

## 2024-01-08 DIAGNOSIS — C189 Malignant neoplasm of colon, unspecified: Secondary | ICD-10-CM | POA: Diagnosis not present

## 2024-01-08 DIAGNOSIS — I9589 Other hypotension: Secondary | ICD-10-CM | POA: Diagnosis not present

## 2024-01-08 DIAGNOSIS — I10 Essential (primary) hypertension: Secondary | ICD-10-CM

## 2024-01-08 LAB — COMPREHENSIVE METABOLIC PANEL
ALT: 22 U/L (ref 0–44)
AST: 33 U/L (ref 15–41)
Albumin: <1.5 g/dL — ABNORMAL LOW (ref 3.5–5.0)
Alkaline Phosphatase: 143 U/L — ABNORMAL HIGH (ref 38–126)
Anion gap: 8 (ref 5–15)
BUN: 48 mg/dL — ABNORMAL HIGH (ref 8–23)
CO2: 23 mmol/L (ref 22–32)
Calcium: 7.1 mg/dL — ABNORMAL LOW (ref 8.9–10.3)
Chloride: 102 mmol/L (ref 98–111)
Creatinine, Ser: 0.88 mg/dL (ref 0.61–1.24)
GFR, Estimated: >60 mL/min (ref >60)
Glucose, Bld: 194 mg/dL — ABNORMAL HIGH (ref 70–99)
Potassium: 3.5 mmol/L (ref 3.5–5.1)
Sodium: 133 mmol/L — ABNORMAL LOW (ref 135–145)
Total Bilirubin: 0.5 mg/dL (ref 0.0–1.2)
Total Protein: 4.2 g/dL — ABNORMAL LOW (ref 6.5–8.1)

## 2024-01-08 LAB — GLUCOSE, CAPILLARY
Glucose-Capillary: 147 mg/dL — ABNORMAL HIGH (ref 70–99)
Glucose-Capillary: 170 mg/dL — ABNORMAL HIGH (ref 70–99)
Glucose-Capillary: 173 mg/dL — ABNORMAL HIGH (ref 70–99)
Glucose-Capillary: 174 mg/dL — ABNORMAL HIGH (ref 70–99)
Glucose-Capillary: 174 mg/dL — ABNORMAL HIGH (ref 70–99)
Glucose-Capillary: 223 mg/dL — ABNORMAL HIGH (ref 70–99)

## 2024-01-08 LAB — SURGICAL PATHOLOGY

## 2024-01-08 LAB — RENAL FUNCTION PANEL
Albumin: <1.5 g/dL — ABNORMAL LOW (ref 3.5–5.0)
Anion gap: 7 (ref 5–15)
BUN: 48 mg/dL — ABNORMAL HIGH (ref 8–23)
CO2: 23 mmol/L (ref 22–32)
Calcium: 7.1 mg/dL — ABNORMAL LOW (ref 8.9–10.3)
Chloride: 103 mmol/L (ref 98–111)
Creatinine, Ser: 0.89 mg/dL (ref 0.61–1.24)
GFR, Estimated: >60 mL/min (ref >60)
Glucose, Bld: 192 mg/dL — ABNORMAL HIGH (ref 70–99)
Phosphorus: 2.4 mg/dL — ABNORMAL LOW (ref 2.5–4.6)
Potassium: 3.5 mmol/L (ref 3.5–5.1)
Sodium: 133 mmol/L — ABNORMAL LOW (ref 135–145)

## 2024-01-08 LAB — CBC
HCT: 20.5 % — ABNORMAL LOW (ref 39.0–52.0)
Hemoglobin: 7 g/dL — ABNORMAL LOW (ref 13.0–17.0)
MCH: 27.5 pg (ref 26.0–34.0)
MCHC: 34.1 g/dL (ref 30.0–36.0)
MCV: 80.4 fL (ref 80.0–100.0)
Platelets: 112 10*3/uL — ABNORMAL LOW (ref 150–400)
RBC: 2.55 MIL/uL — ABNORMAL LOW (ref 4.22–5.81)
RDW: 16.2 % — ABNORMAL HIGH (ref 11.5–15.5)
WBC: 8 10*3/uL (ref 4.0–10.5)
nRBC: 0 % (ref 0.0–0.2)

## 2024-01-08 LAB — TRIGLYCERIDES: Triglycerides: 126 mg/dL (ref <150)

## 2024-01-08 LAB — PHOSPHORUS: Phosphorus: 2.5 mg/dL (ref 2.5–4.6)

## 2024-01-08 LAB — MAGNESIUM: Magnesium: 2.4 mg/dL (ref 1.7–2.4)

## 2024-01-08 MED ORDER — FAT EMUL FISH OIL/PLANT BASED 20% (SMOFLIPID)IV EMUL
250.0000 mL | INTRAVENOUS | Status: AC
Start: 2024-01-08 — End: 2024-01-09
  Administered 2024-01-08: 250 mL via INTRAVENOUS
  Filled 2024-01-08: qty 250

## 2024-01-08 MED ORDER — DEXTROSE 10 % IV SOLN: INTRAVENOUS | Status: DC

## 2024-01-08 MED ORDER — SODIUM CHLORIDE 0.9% FLUSH: 10.0000 mL | INTRAVENOUS | Status: DC | PRN

## 2024-01-08 MED ORDER — AMIODARONE HCL 200 MG PO TABS: 200.0000 mg | ORAL_TABLET | Freq: Every day | ORAL | Status: DC

## 2024-01-08 MED ORDER — TRACE MINERALS CU-MN-SE-ZN 300-55-60-3000 MCG/ML IV SOLN
INTRAVENOUS | Status: AC
  Filled 2024-01-08: qty 1000

## 2024-01-08 MED ORDER — AMIODARONE HCL 200 MG PO TABS
100.0000 mg | ORAL_TABLET | Freq: Every day | ORAL | Status: DC
  Administered 2024-01-08 – 2024-01-10 (×3): 100 mg via ORAL
  Filled 2024-01-08 (×3): qty 1

## 2024-01-08 MED ORDER — SODIUM CHLORIDE 0.9% FLUSH
10.0000 mL | Freq: Two times a day (BID) | INTRAVENOUS | Status: DC
  Administered 2024-01-08 – 2024-01-10 (×5): 10 mL

## 2024-01-08 NOTE — Progress Notes (Signed)
 Peripherally Inserted Central Catheter Placement  The IV Nurse has discussed with the patient and/or persons authorized to consent for the patient, the purpose of this procedure and the potential benefits and risks involved with this procedure.  The benefits include less needle sticks, lab draws from the catheter, and the patient may be discharged home with the catheter. Risks include, but not limited to, infection, bleeding, blood clot (thrombus formation), and puncture of an artery; nerve damage and irregular heartbeat and possibility to perform a PICC exchange if needed/ordered by physician.  Alternatives to this procedure were also discussed.  Bard Power PICC patient education guide, fact sheet on infection prevention and patient information card has been provided to patient /or left at bedside.    PICC Placement Documentation  PICC Triple Lumen 01/08/24 Right Brachial 41 cm 0 cm (Active)  Indication for Insertion or Continuance of Line Administration of hyperosmolar/irritating solutions (i.e. TPN, Vancomycin, etc.) 01/08/24 1135  Exposed Catheter (cm) 0 cm 01/08/24 1135  Site Assessment Clean, Dry, Intact 01/08/24 1135  Lumen #1 Status Flushed;Saline locked;Blood return noted 01/08/24 1135  Lumen #2 Status Flushed;Saline locked;Blood return noted 01/08/24 1135  Lumen #3 Status Flushed;Saline locked;Blood return noted 01/08/24 1135  Dressing Type Transparent;Securing device 01/08/24 1135  Dressing Status Antimicrobial disc/dressing in place;Clean, Dry, Intact 01/08/24 1135  Line Adjustment (NICU/IV Team Only) No 01/08/24 1135  Dressing Change Due 01/15/24 01/08/24 1135       Romie Jumper 01/08/2024, 11:43 AM

## 2024-01-08 NOTE — Progress Notes (Signed)
 Consult rec'd for troubleshooting TPN/lipids infusing via central line that pt. Pulled out.  Secure chatted RN and expained They are contaminated and will have to be trashed. Wanted info on PICC.  Explained not available until 7am.  Advised to message MD.

## 2024-01-08 NOTE — Progress Notes (Signed)
 Patient has removed central line access. TPN and fat emulsion has been paused. Dr. Arville Care is aware and has ordered a PICC line placement.

## 2024-01-08 NOTE — Consult Note (Addendum)
 WOC Nurse ostomy consult note Reason for Consult: Visit after surgery to change ostomy bag and ostomy teaching. 4 days Post-op. Wound type: incision on midline, Ileostomy on the right and fistula on the left.  Reason for Consult: Visit after surgery. 4 days Post-op. Stoma type/location: Right lower quadrant ileostomy and fistula on the left lower quadrant. Stomal assessment/size: Ileostomy 25 mm round black on the top, red and viable in the base of the ostomy, functional. Fistula 35 mm x 30 mm, yellow/gray tissue, draining serosanguinous 20 ml at 1200. Peristomal assessment: intact Treatment options for stomal/peristomal skin:  Output 100 ml today at 1200 from the ileostomy. Ostomy pouching: 2pc.  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    Enrolled patient in [employer] DC program: No   The WOC team will follow weekly to further ostomy teaching.   Thank-you,  Denyse Amass BSN, RN, [employer], WOC  (Pager: [phone])

## 2024-01-08 NOTE — Progress Notes (Signed)
 Progress Note   Patient: Alex DEALMEIDA Sr. MVH:[id number] DOB: 1946-02-05 DOA: 12/29/2023     10 DOS: the patient was seen and examined on 01/08/2024   Brief hospital course: [name] is a 78 y.o. male with medical history significant for DM, HTN, Lewy body dementia, recently diagnosed adenocarcinoma of the colon on colonoscopy 12/20/2023 that was done for iron deficiency anemia and abdominal pain.  She saw her oncologist on 12/27/2023 to discuss therapy for newly diagnosed colon cancer.  He presented to the hospital with nausea, vomiting and worsening right lower quadrant abdominal pain, found to have large bowel obstruction.    Patient underwent exploratory laparotomy with right hemicolectomy and primary anastomosis on 12/31/2023.  Hospital course was complicated by anastomotic leak.  He was taken back to the OR for resection with ileostomy and mucous fistula placement on 01/04/2024.  He was transferred to the ICU for postoperative care because he could not be extubated after surgery.  He required vasopressors for septic shock in the ICU.  Also had atrial fibrillation with RVR and required IV amiodarone drip.  He was eventually extubated and taken off vasopressors. Patient subsequently transferred to the hospitalist service on 01/07/2024.  Assessment and Plan: Large bowel obstruction, recently diagnosed transverse colon adenocarcinoma: S/p ex laparotomy, right hemicolectomy with primary anastomosis on 12/31/2023.   S/p repair of anastomotic leak resection with ileostomy and mucous fistula placement on 01/04/2024 Continue empiric IV Zosyn and IV fluconazole He is on TPN for nutrition Analgesics as needed for pain. Discontinue IV fluids. Follow-up with general surgeon.   Acute postoperative anemia of blood loss on chronic iron deficiency anemia:  Hemoglobin down to 7.0 today.  Continue to monitor H/H daily. Possible hemodilution contributing. No indication for blood transfusion at this time.   Ferritin was 10, iron sat duration ratio 17 and iron level 48 on 12/27/2023   Septic shock secondary to perforated colon/peritonitis: Continue IV Zosyn and IV fluconazole. He is off of vasopressors. Continue to monitor vitals closely.   Atrial fibrillation with RVR: Heart rate around 100.  Continue IV amiodarone drip as per cardiology recommendations. Continue telemetry monitoring   Postop ventilatory requirement: S/p extubation.  Continue 3 L/min oxygen via [place].   AKI: Improved.  Discontinue IV fluids, continue PPN   Severe protein calorie malnutrition, severe hypoalbuminemia:  BMI 19.92.  He is eating poorly. Continue TPN, after PICC line replacement   Hypokalemia: Improved. Hyponatremia: Seems chronic, stable.   Monitor electrolytes and replete as needed   Type II DM: Continue NovoLog correction scale as needed for hyperglycemia. Hypoglycemia protocol   Hypertension: BP stable.  losartan-HCTZ has been held.   Lewy body dementia: No acute issues. Nursing supportive care  Fall, aspiration precautions. DVT prophylaxis   Code Status: Full Code  Subjective: Patient is seen and examined today morning.  Patient pulled out his PICC line yesterday.  Patient has mittens.  Able to answer me appropriately.  He is eating poor, has nausea, abdominal discomfort.    Physical Exam: Vitals:   01/08/24 1300 01/08/24 1303 01/08/24 1400 01/08/24 1500  BP: (!) 147/60  (!) 112/57 134/62  Pulse: 73 67 63 65  Resp: (!) 23 (!) 22 19 (!) 25  Temp:      TempSrc:      SpO2: 98% 98% 97% 92%  Weight:      Height:        General - Elderly thin built Caucasian male, distress due to nausea HEENT - PERRLA,  EOMI, atraumatic head, non tender sinuses. Lung - Clear, basal rales, rhonchi, no wheezes. Heart - S1, S2 heard, no murmurs, rubs, trace pedal edema. Abdomen - Soft, non tender, drain, ileostomy bag with stool right lower quadrant, midline dressing noted. Neuro - Alert, awake and able to follow  simple commands, non focal exam. Skin - Warm and dry.  Data Reviewed:      Latest Ref Rng & Units 01/08/2024    3:12 AM 01/07/2024    4:20 AM 01/06/2024    4:20 AM  CBC  WBC 4.0 - 10.5 K/uL 8.0  9.5  13.0   Hemoglobin 13.0 - 17.0 g/dL 7.0  7.3  7.7   Hematocrit 39.0 - 52.0 % 20.5  21.4  22.6   Platelets 150 - 400 K/uL 112  116  156       Latest Ref Rng & Units 01/08/2024    3:12 AM 01/07/2024    4:20 AM 01/06/2024    4:20 AM  BMP  Glucose 70 - 99 mg/dL 70 - 99 mg/dL 102    725  366  440   BUN 8 - 23 mg/dL 8 - 23 mg/dL 48    48  52  46   Creatinine 0.61 - 1.24 mg/dL 3.47 - 4.25 mg/dL 9.56    3.87  5.64  3.32   Sodium 135 - 145 mmol/L 135 - 145 mmol/L 133    133  132  133   Potassium 3.5 - 5.1 mmol/L 3.5 - 5.1 mmol/L 3.5    3.5  3.8  3.9   Chloride 98 - 111 mmol/L 98 - 111 mmol/L 103    102  103  103   CO2 22 - 32 mmol/L 22 - 32 mmol/L 23    23  24  24    Calcium 8.9 - 10.3 mg/dL 8.9 - 95.1 mg/dL 7.1    7.1  6.8  6.8    [place] EKG SITE RITE Result Date: 01/08/2024 If Site Rite image not attached, placement could not be confirmed due to current cardiac rhythm.   Family Communication: Discussed with patient, he understand and agree. All questions answered.  Disposition: Status is: Inpatient Remains inpatient appropriate because: Status post ex lap, complicated hospital stay, weak, on TPN  Planned Discharge Destination: Home with Home Health    MDM level 3: Patient had complicated hospital stay status post ex lap right hemicolectomy, anastomotic leak, ileostomy.  Patient is on TPN, IV antibiotic therapy.  He is still weak, confused.  He has A-fib RVR on amiodarone drip.  Patient will need close monitoring and is at high risk for sudden clinical deterioration.  Author: Marcelino Duster, MD 01/08/2024 4:15 PM Secure chat 7am to 7pm For on call review [email or web address].

## 2024-01-08 NOTE — Progress Notes (Signed)
 PHARMACY - TOTAL PARENTERAL NUTRITION CONSULT NOTE   Indication: without adequate nutrition for 7 days   Patient Measurements: Height: 6\' 1"  (185.4 cm) Weight: 68.5 kg (151 lb 0.2 oz) IBW/kg (Calculated) : 79.9 TPN AdjBW (KG): 69.2 Body mass index is 19.92 kg/m.  Assessment: 78 y/o male with h/o HTN, HLD, DM, dementia, diverticulitis, GERD, HLD, DJD and inguinal hernia s/p repair who is admitted with LBO secondary to colon mass (Stage IIa colon adenocarcinoma) s/p exploratory laparotomy with right hemicolectomy with primary anastomosis 3/2 complicated by post op ileus and new onset atrial fibrillation with RVR.   Glucose / Insulin: SSI required/previous 24h: 17 units Electrolytes: WNL Renal: SCr 0.66-->1.09 > 0.89 Hepatic: AST>ALT: improving Intake / Output;  03/09 0701 - 03/10 0700 In: 3747.6 [I.V.:2818.2; IV Piggyback:929.4] Out: 1631 [Urine:950; Drains:10; Stool:671]  GI Imaging: 03/06 CT pelvis: anastomotic leak, Atrophic pancreas without inflammation   GI Surgeries / Procedures:  S/p ex-lap, right colectomy, anastamotic leak s/p resection with ileostomy and mucous fistula placement.   Central access: 01/04/24 TPN start date: 01/05/24  RD Assessment: Estimated Needs Total Energy Estimated Needs: 2000-2300kcal/day Total Protein Estimated Needs: 100-115g/day Total Fluid Estimated Needs: 1.8-2.1L/day  Current Nutrition:  NPO  Plan:  ---Continue Clinimix E8/10 TPN at 13mL/hr at 1800 ---Electrolytes in TPN(standard): Na 62mEq/L, K 16mEq/L, Ca 4.71mEq/L, Mg 71mEq/L, and Phos 94mmol/L. Cl:Ac 0.49 ---nutritional components:  Dextrose 680 kcal (200.8 g) Protein 640 kcal (160 g)  Total Kcal: 1834 ---Add standard MVI, thiamine 100 mg and trace elements to TPN ---continue Sensitive q4h SSI and adjust as needed  ---Monitor TPN labs on Mon/Thurs, daily until stable  Alex Soto 01/08/2024,8:28 AM

## 2024-01-08 NOTE — Consult Note (Addendum)
 WOC team received consult for new ileostomy placed 01/04/2024 by Dr. Maurine Minister.  Patient currently in ICU setting and confused per physician notes.  WOC team will follow.   Thank you,    Priscella Mann MSN, RN-BC, [employer] [phone]

## 2024-01-08 NOTE — Plan of Care (Signed)
  Problem: Education: Goal: Ability to describe self-care measures that may prevent or decrease complications (Diabetes Survival Skills Education) will improve Outcome: Not Progressing   Problem: Health Behavior/Discharge Planning: Goal: Ability to identify and utilize available resources and services will improve Outcome: Not Progressing   Problem: Skin Integrity: Goal: Risk for impaired skin integrity will decrease Outcome: Progressing   Problem: Tissue Perfusion: Goal: Adequacy of tissue perfusion will improve Outcome: Progressing

## 2024-01-08 NOTE — Progress Notes (Signed)
 [facility] SURGICAL PROGRESS NOTE  Hospital Day(s): 10.   Post op day(s): 4 Days Post-Op.   Interval History:  Patient seen and examined Confused overnight Pulled central line Leukocytosis remains resolved; WBC 8.0K Hgb to 7.0; slow down trend Renal function normal; sCr - 0.88; UO - 950 ccs No significant electrolyte derangements  Drain with 10 ccs out; serous  Ileostomy with good output Continues on Zosyn/Diflucan   Vital signs in last 24 hours: [min-max] current  Temp:  [97.7 F (36.5 C)-99.3 F (37.4 C)] 97.7 F (36.5 C) (03/10 0454) Pulse Rate:  [63-106] 71 (03/10 0600) Resp:  [15-32] 28 (03/10 0600) BP: (111-149)/(50-80) 126/58 (03/10 0600) SpO2:  [92 %-99 %] 99 % (03/10 0600) Weight:  [68.5 kg] 68.5 kg (03/10 0402)     Height: 6\' 1"  (185.4 cm) Weight: 68.5 kg BMI (Calculated): 19.93   Intake/Output last 2 shifts:  03/09 0701 - 03/10 0700 In: 3747.6 [I.V.:2818.2; IV Piggyback:929.4] Out: 1631 [Urine:950; Drains:10; Stool:671]   Physical Exam:  Constitutional: confused, arouses appropriately, NAD Respiratory: breathing non-labored at rest, on [place] Cardiovascular: tachycardic to 105 bpm and sinus rhythm  Gastrointestinal: soft, expected tenderness, non-distended, no rebound/guarding. 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 Mucus fistula in left abdomen; scant bowel sweat in bag. Drain in right lateral abdomen; serous.  Integumentary: Midline wound healing via secondary intention, fascia intact  Labs:     Latest Ref Rng & Units 01/08/2024    3:12 AM 01/07/2024    4:20 AM 01/06/2024    4:20 AM  CBC  WBC 4.0 - 10.5 K/uL 8.0  9.5  13.0   Hemoglobin 13.0 - 17.0 g/dL 7.0  7.3  7.7   Hematocrit 39.0 - 52.0 % 20.5  21.4  22.6   Platelets 150 - 400 K/uL 112  116  156       Latest Ref Rng & Units 01/08/2024    3:12 AM 01/07/2024    4:20 AM 01/06/2024    4:20 AM  CMP  Glucose 70 - 99 mg/dL 70 - 99 mg/dL 409    811  914  782    BUN 8 - 23 mg/dL 8 - 23 mg/dL 48    48  52  46   Creatinine 0.61 - 1.24 mg/dL 9.56 - 2.13 mg/dL 0.86    5.78  4.69  6.29   Sodium 135 - 145 mmol/L 135 - 145 mmol/L 133    133  132  133   Potassium 3.5 - 5.1 mmol/L 3.5 - 5.1 mmol/L 3.5    3.5  3.8  3.9   Chloride 98 - 111 mmol/L 98 - 111 mmol/L 103    102  103  103   CO2 22 - 32 mmol/L 22 - 32 mmol/L 23    23  24  24    Calcium 8.9 - 10.3 mg/dL 8.9 - 52.8 mg/dL 7.1    7.1  6.8  6.8   Total Protein 6.5 - 8.1 g/dL 4.2     Total Bilirubin 0.0 - 1.2 mg/dL 0.5     Alkaline Phos 38 - 126 U/L 143     AST 15 - 41 U/L 33     ALT 0 - 44 U/L 22        Imaging studies: No new pertinent imaging studies   Assessment/Plan: 78 y.o. male 4 Days Post-Op s/p exploratory laparotomy with resection of right ileocolonic anastomosis with creation of end ileostomy and  mucus fistula for anastomotic leak following initial right hemicolectomy for colon CA   - We can trial CLD as tolerated - Agree with replacing PICC for TPN - Continue antimicrobial (Zosyn, Diflucan) - Monitor abdominal examination - Monitor ostomy outputs; engage WOC for ostomy teaching when feasible  - Wound Care: Pack midline wound with gauze, cover, secure. Change daily and as needed - Pain control prn; antiemetics prn - Work with therapies as feasible; anticipate significant deconditioning    - Appreciate cardiology input   - Further management per primary service; we will follow   All of the above findings and recommendations were discussed with the patient, and the medical team, and all of patient's questions were answered to his expressed satisfaction.  -- Lynden Oxford, PA-C [facility] 01/08/2024, 7:20 AM M-F: 7am - 4pm

## 2024-01-09 DIAGNOSIS — I9589 Other hypotension: Secondary | ICD-10-CM | POA: Diagnosis not present

## 2024-01-09 DIAGNOSIS — K56609 Unspecified intestinal obstruction, unspecified as to partial versus complete obstruction: Secondary | ICD-10-CM | POA: Diagnosis not present

## 2024-01-09 DIAGNOSIS — F03918 Unspecified dementia, unspecified severity, with other behavioral disturbance: Secondary | ICD-10-CM | POA: Diagnosis not present

## 2024-01-09 DIAGNOSIS — C189 Malignant neoplasm of colon, unspecified: Secondary | ICD-10-CM | POA: Diagnosis not present

## 2024-01-09 LAB — GLUCOSE, CAPILLARY
Glucose-Capillary: 202 mg/dL — ABNORMAL HIGH (ref 70–99)
Glucose-Capillary: 189 mg/dL — ABNORMAL HIGH (ref 70–99)
Glucose-Capillary: 221 mg/dL — ABNORMAL HIGH (ref 70–99)
Glucose-Capillary: 173 mg/dL — ABNORMAL HIGH (ref 70–99)
Glucose-Capillary: 182 mg/dL — ABNORMAL HIGH (ref 70–99)

## 2024-01-09 LAB — RENAL FUNCTION PANEL
Albumin: <1.5 g/dL — ABNORMAL LOW (ref 3.5–5.0)
Anion gap: 7 (ref 5–15)
BUN: 39 mg/dL — ABNORMAL HIGH (ref 8–23)
CO2: 23 mmol/L (ref 22–32)
Calcium: 7.4 mg/dL — ABNORMAL LOW (ref 8.9–10.3)
Chloride: 101 mmol/L (ref 98–111)
Creatinine, Ser: 0.77 mg/dL (ref 0.61–1.24)
GFR, Estimated: >60 mL/min (ref >60)
Glucose, Bld: 193 mg/dL — ABNORMAL HIGH (ref 70–99)
Phosphorus: 2.3 mg/dL — ABNORMAL LOW (ref 2.5–4.6)
Potassium: 3.2 mmol/L — ABNORMAL LOW (ref 3.5–5.1)
Sodium: 131 mmol/L — ABNORMAL LOW (ref 135–145)

## 2024-01-09 LAB — CBC
HCT: 19.9 % — ABNORMAL LOW (ref 39.0–52.0)
Hemoglobin: 6.9 g/dL — ABNORMAL LOW (ref 13.0–17.0)
MCH: 27.5 pg (ref 26.0–34.0)
MCHC: 34.7 g/dL (ref 30.0–36.0)
MCV: 79.3 fL — ABNORMAL LOW (ref 80.0–100.0)
Platelets: 110 10*3/uL — ABNORMAL LOW (ref 150–400)
RBC: 2.51 MIL/uL — ABNORMAL LOW (ref 4.22–5.81)
RDW: 16.3 % — ABNORMAL HIGH (ref 11.5–15.5)
WBC: 7.3 10*3/uL (ref 4.0–10.5)
nRBC: 0.3 % — ABNORMAL HIGH (ref 0.0–0.2)

## 2024-01-09 LAB — MAGNESIUM: Magnesium: 2.2 mg/dL (ref 1.7–2.4)

## 2024-01-09 LAB — PREPARE RBC (CROSSMATCH)

## 2024-01-09 MED ORDER — POTASSIUM CHLORIDE 20 MEQ PO PACK
40.0000 meq | PACK | Freq: Once | ORAL | Status: AC
  Administered 2024-01-09: 40 meq via ORAL
  Filled 2024-01-09: qty 2

## 2024-01-09 MED ORDER — FAT EMUL FISH OIL/PLANT BASED 20% (SMOFLIPID)IV EMUL
250.0000 mL | INTRAVENOUS | Status: AC
  Administered 2024-01-09: 250 mL via INTRAVENOUS
  Filled 2024-01-09: qty 250

## 2024-01-09 MED ORDER — INSULIN ASPART 100 UNIT/ML IJ SOLN
0.0000 [IU] | INTRAMUSCULAR | Status: DC
  Administered 2024-01-09 – 2024-01-10 (×4): 3 [IU] via SUBCUTANEOUS
  Administered 2024-01-10 (×3): 5 [IU] via SUBCUTANEOUS
  Filled 2024-01-09 (×7): qty 1

## 2024-01-09 MED ORDER — TRACE MINERALS CU-MN-SE-ZN 300-55-60-3000 MCG/ML IV SOLN
INTRAVENOUS | Status: AC
  Filled 2024-01-09: qty 2000

## 2024-01-09 MED ORDER — ONDANSETRON HCL 4 MG/2ML IJ SOLN
4.0000 mg | Freq: Three times a day (TID) | INTRAMUSCULAR | Status: DC | PRN
  Administered 2024-01-09 – 2024-01-10 (×2): 4 mg via INTRAVENOUS
  Filled 2024-01-09 (×3): qty 2

## 2024-01-09 MED ORDER — SODIUM CHLORIDE 0.9% IV SOLUTION: Freq: Once | INTRAVENOUS | Status: AC

## 2024-01-09 NOTE — Progress Notes (Addendum)
 PHARMACY - TOTAL PARENTERAL NUTRITION CONSULT NOTE   Indication: without adequate nutrition for 7 days   Patient Measurements: Height: 6\' 1"  (185.4 cm) Weight: 73.1 kg (161 lb 2.5 oz) IBW/kg (Calculated) : 79.9 TPN AdjBW (KG): 69.2 Body mass index is 21.26 kg/m.  Assessment: 78 y/o male with h/o HTN, HLD, DM, dementia, diverticulitis, GERD, HLD, DJD and inguinal hernia s/p repair who is admitted with LBO secondary to colon mass (Stage IIa colon adenocarcinoma) s/p exploratory laparotomy with right hemicolectomy with primary anastomosis 3/2 complicated by post op ileus and new onset atrial fibrillation with RVR.   Glucose / Insulin: SSI required/previous 24h: 12 units Electrolytes: WNL Renal: SCr 0.66-->1.09 > 0.77 Hepatic: AST>ALT: improving Intake / Output;  03/10 0701 - 03/11 0700 In: 1951.7 [I.V.:1398.8; IV Piggyback:552.8] Out: 1235 [Urine:1200; Stool:35] GI Imaging: 03/06 CT pelvis: anastomotic leak, Atrophic pancreas without inflammation   GI Surgeries / Procedures:  S/p ex-lap, right colectomy, anastamotic leak s/p resection with ileostomy and mucous fistula placement.   Central access: 01/04/24 TPN start date: 01/05/24  RD Assessment: Estimated Needs Total Energy Estimated Needs: 2000-2300kcal/day Total Protein Estimated Needs: 100-115g/day Total Fluid Estimated Needs: 1.8-2.1L/day  Current Nutrition:  NPO  Plan:  ---Continue Clinimix E8/10 TPN at 42mL/hr at 1800 ---Electrolytes in TPN(standard): Na 4mEq/L, K 49mEq/L, Ca 4.40mEq/L, Mg 83mEq/L, and Phos 41mmol/L. Cl:Ac 0.49 ---nutritional components:  Dextrose 680 kcal (200.8 g) Protein 640 kcal (160 g)  Total Kcal: 1834 ---Add standard MVI, thiamine 100 mg and trace elements to TPN ---adjust to moderate  q4h SSI and adjust as needed  ---Monitor TPN labs on Mon/Thurs, daily until stable  Lowella Bandy 01/09/2024,7:55 AM

## 2024-01-09 NOTE — Consult Note (Addendum)
 PHARMACY CONSULT NOTE - FOLLOW UP  Pharmacy Consult for Electrolyte Monitoring and Replacement   Recent Labs: Potassium (mmol/L)  Date Value  01/09/2024 3.2 (L)  04/09/2014 3.9   Magnesium (mg/dL)  Date Value  16/08/9603 2.2   Calcium (mg/dL)  Date Value  54/07/8118 7.4 (L)   Calcium, Total (mg/dL)  Date Value  14/78/2956 8.9   Albumin (g/dL)  Date Value  21/30/8657 <1.5 (L)  04/08/2014 4.1   Phosphorus (mg/dL)  Date Value  84/69/6295 2.3 (L)   Sodium (mmol/L)  Date Value  01/09/2024 131 (L)  04/09/2014 136   Assessment: Alex Soto is a 78 yo male who presented with nausea, vomiting and worsening right quadrant pain, found to have a large bowel obstruction. During the course of their hospitalization, they developed atrial fibrillation with RVR requiring amiodarone. Patient was admitted to the ICU due an inability to wean them from the ventilator post operation for resection with ileostomy and mucous fistula placement on 3/6/202. Pharmacy was consulted to manage this patient's electrolytes while they're in the ICU.    Goal of Therapy:  Electrolytes WNL K > 4 and Mg > 2 (due to new onset atrial fib)   Plan:  -- K = 3.2: Replace with 40 mEq of Kcl PO x 1  -- Recommend consider increasing K in TPN  -- No other replacement indicated at this time -- Continue to monitor electrolytes with AM labs   Effie Shy, PharmD Pharmacy Resident  01/09/2024 6:49 AM

## 2024-01-09 NOTE — Progress Notes (Signed)
 [facility] CLINIC CARDIOLOGY PROGRESS NOTE       Patient ID: Alex MATTERS Sr. MRN: [id number] DOB/AGE: [birth year] 78 y.o.  Admit date: 12/29/2023 Referring Physician Dr. Marrion Coy Primary Physician Dareen Piano Marya Amsler, MD  Primary Cardiologist None Reason for Consultation AF RVR  HPI: Alex Feinstein. is a 78 y.o. male  with a past medical history of hypertension, type II diabetes, lewy body dementia, adenocarcinoma of colon who presented to the ED on 12/29/2023 for abdominal pain. Underwent ex lap with R hemicolectomy 12/31/2023. Earlier this AM he developed AF RVR and hypotension. Cardiology was consulted for further evaluation.   Interval history: -Patient seen and examined this AM, resting comfortably in bed.  -Does not answer most questions but has no complaints and denies any pain.  -HR stable, in NSR. BP stable.   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  Only wears upper    Past Surgical History:  Procedure Laterality Date   AMPUTATION TOE Right 03/01/2020   Procedure: AMPUTATION TOE;  Surgeon: [name], DPM;  Location: [facility] OR[facility];  Service: Podiatry;  Laterality: Right;   AMPUTATION TOE Right 01/03/2023   Procedure: AMPUTATION TOE, 2nd right;   Surgeon: [name], DPM;  Location: [facility];  Service: Podiatry;  Laterality: Right;   APPENDECTOMY     BIOPSY  12/20/2023   Procedure: BIOPSY;  Surgeon: Norma Fredrickson, Boykin Nearing, MD;  Location: [facility] ENDOSCOPY;  Service: Gastroenterology;;   BONE EXCISION Right 01/03/2023   Procedure: Exostectomy interphalangeal joint right great toe;  Surgeon: [name], DPM;  Location: [facility];  Service: Podiatry;  Laterality: Right;   BOWEL RESECTION N/A 01/04/2024   Procedure: RESECTION OF ANASTOMOSIS, TAKE DOWN SPLENIC FLEXTURE;  Surgeon: Kandis Cocking, MD;  Location: [facility];  Service: General;  Laterality: N/A;   CATARACT EXTRACTION W/PHACO Left 09/06/2021   Procedure: CATARACT EXTRACTION PHACO AND INTRAOCULAR LENS PLACEMENT (IOC) LEFT 2.71 00:30.6;  Surgeon: Nevada Crane, MD;  Location: [facility];  Service: Ophthalmology;  Laterality: Left;  Diabetic   COLONOSCOPY WITH PROPOFOL N/A 12/20/2023   Procedure: COLONOSCOPY WITH PROPOFOL;  Surgeon: Toledo, Boykin Nearing, MD;  Location: [facility] ENDOSCOPY;  Service: Gastroenterology;  Laterality: N/A;   COLOSTOMY N/A 01/04/2024   Procedure: CREATION, END ILEOSTOMY AND MUCOUS FISTULA;  Surgeon: Kandis Cocking, MD;  Location: [facility];  Service: General;  Laterality: N/A;   ESOPHAGOGASTRODUODENOSCOPY (EGD) WITH PROPOFOL N/A 12/20/2023   Procedure: ESOPHAGOGASTRODUODENOSCOPY (EGD) WITH PROPOFOL;  Surgeon: Toledo, Boykin Nearing, MD;  Location: [facility] ENDOSCOPY;  Service: Gastroenterology;  Laterality: N/A;   EYE SURGERY     age 70   HERNIA REPAIR  INSERTION OF MESH  06/23/2023   Procedure: INSERTION OF MESH;  Surgeon: Sung Amabile, DO;  Location: [facility];  Service: General;;   KIDNEY STONE SURGERY     LAPAROTOMY Right 12/31/2023   Procedure: EXPLORATORY LAPAROTOMY WITH RIGHT COLECTOMY;  Surgeon: Kandis Cocking, MD;  Location: [facility];  Service: General;  Laterality: Right;   LAPAROTOMY N/A 01/04/2024   Procedure: LAPAROTOMY, EXPLORATORY;  Surgeon:  Kandis Cocking, MD;  Location: [facility];  Service: General;  Laterality: N/A;   REMOVAL RETAINED LENS Right 04/19/2021   Procedure: CATARACT EXTRACTION PHACO,  RIGHT VISION BLUE 0.20 00:01.4 case aborted;  Surgeon: Nevada Crane, MD;  Location: [facility];  Service: Ophthalmology;  Laterality: Right;  Diabetic - oral meds   SHOULDER ARTHROSCOPY WITH BICEPS TENDON REPAIR Right 10/06/2016   Procedure: SHOULDER ARTHROSCOPY WITH BICEPS TENDON REPAIR;  Surgeon: Christena Flake, MD;  Location: [facility];  Service: Orthopedics;  Laterality: Right;   SHOULDER ARTHROSCOPY WITH OPEN ROTATOR CUFF REPAIR Right 10/06/2016   Procedure: SHOULDER ARTHROSCOPY WITH OPEN ROTATOR CUFF REPAIR;  Surgeon: Christena Flake, MD;  Location: [facility];  Service: Orthopedics;  Laterality: Right;   SHOULDER ARTHROSCOPY WITH SUBACROMIAL DECOMPRESSION Right 10/06/2016   Procedure: SHOULDER ARTHROSCOPY WITH SUBACROMIAL DECOMPRESSION AND DEBRIDEMENT;  Surgeon: Christena Flake, MD;  Location: [facility];  Service: Orthopedics;  Laterality: Right;   SUBMUCOSAL TATTOO INJECTION  12/20/2023   Procedure: SUBMUCOSAL TATTOO INJECTION;  Surgeon: Toledo, Boykin Nearing, MD;  Location: [facility] ENDOSCOPY;  Service: Gastroenterology;;   TONSILLECTOMY      Medications Prior to Admission  Medication Sig Dispense Refill Last Dose/Taking   aspirin 81 MG tablet Take 81 mg by mouth daily.   Past Week   atorvastatin (LIPITOR) 80 MG tablet Take 80 mg by mouth daily.   Past Week   Cholecalciferol (VITAMIN D3) 50 MCG (2000 UT) TABS Take 1 tablet by mouth daily.   Past Week   cyanocobalamin (VITAMIN B12) 1000 MCG tablet Take 1,000 mcg by mouth daily.   Past Week   ferrous sulfate 325 (65 FE) MG tablet Take 325 mg by mouth 2 (two) times daily with a meal.   Past Week   glipiZIDE (GLUCOTROL) 5 MG tablet Take 5 mg by mouth daily.   Past Week   losartan-hydrochlorothiazide (HYZAAR) 100-12.5 MG tablet Take 1 tablet by mouth daily.   Past Week   metFORMIN  (GLUCOPHAGE) 500 MG tablet Take 500 mg by mouth 2 (two) times daily with a meal.   Past Week   pioglitazone (ACTOS) 15 MG tablet Take 15 mg by mouth daily.   Past Week   rivastigmine (EXELON) 3 MG capsule Take 3 mg by mouth 2 (two) times daily.   Past Week   Social History   Socioeconomic History   Marital status: Married    Spouse name: Burna Mortimer   Number of children: 3   Years of education: Not on file   Highest education level: Not on file  Occupational History   Not on file  Tobacco Use   Smoking status: Former    Current packs/day: 0.00    Average packs/day: 0.3 packs/day for 10.0 years (2.5 ttl pk-yrs)    Types: Cigarettes    Start date: 10/01/1959    Quit date: 09/30/1969    Years since quitting: 54.3    Passive exposure: Past   Smokeless tobacco: Former  Building services engineer status: Never Used  Substance and Sexual Activity   Alcohol  use: No   Drug use: No   Sexual activity: Not on file  Other Topics Concern   Not on file  Social History Narrative   Not on file   Social Drivers of Health   Financial Resource Strain: Low Risk  (07/18/2023)   Received from [facility]   Overall Financial Resource Strain (CARDIA)    Difficulty of Paying Living Expenses: Not hard at all  Food Insecurity: No Food Insecurity (12/29/2023)   Hunger Vital Sign    Worried About Running Out of Food in the Last Year: Never true    Ran Out of Food in the Last Year: Never true  Transportation Needs: No Transportation Needs (12/29/2023)   PRAPARE - Administrator, Civil Service (Medical): No    Lack of Transportation (Non-Medical): No  Physical Activity: Not on file  Stress: Not on file  Social Connections: Moderately Integrated (12/30/2023)   Social Connection and Isolation Panel [NHANES]    Frequency of Communication with Friends and Family: More than three times a week    Frequency of Social Gatherings with Friends and Family: Twice a week    Attends Religious  Services: More than 4 times per year    Active Member of [employer] or Organizations: No    Attends Banker Meetings: Never    Marital Status: Married  Catering manager Violence: Not At Risk (12/29/2023)   Humiliation, Afraid, Rape, and Kick questionnaire    Fear of Current or Ex-Partner: No    Emotionally Abused: No    Physically Abused: No    Sexually Abused: No    Family History  Family history unknown: Yes     Vitals:   01/09/24 1000 01/09/24 1100 01/09/24 1149 01/09/24 1150  BP: (!) 142/56 (!) 127/58  (!) 140/65  Pulse: 65 65  69  Resp: 17 12  18   Temp:   98.3 F (36.8 C)   TempSrc:   Axillary   SpO2: 93% 94%  95%  Weight:      Height:        PHYSICAL EXAM General: Ill-appearing elderly male, well nourished, in no acute distress. HEENT: Normocephalic and atraumatic. Neck: No JVD.  Lungs: Normal respiratory effort on 2 L nasal cannula. Clear bilaterally to auscultation. No wheezes, crackles, rhonchi.  Heart: HRRR. Normal S1 and S2 without gallops or murmurs.  Abdomen: Non-distended appearing.  Msk: Normal strength and tone for age. Extremities: Warm and well perfused. No clubbing, cyanosis.  No significant edema.  Neuro: Alert and oriented X 3. Psych: Answers questions appropriately.   Labs: Basic Metabolic Panel: Recent Labs    01/08/24 0312 01/09/24 0445  NA 133*  133* 131*  K 3.5  3.5 3.2*  CL 103  102 101  CO2 23  23 23   GLUCOSE 192*  194* 193*  BUN 48*  48* 39*  CREATININE 0.89  0.88 0.77  CALCIUM 7.1*  7.1* 7.4*  MG 2.4 2.2  PHOS 2.4*  2.5 2.3*   Liver Function Tests: Recent Labs    01/08/24 0312 01/09/24 0445  AST 33  --   ALT 22  --   ALKPHOS 143*  --   BILITOT 0.5  --   PROT 4.2*  --   ALBUMIN <1.5*  <1.5* <1.5*   No results for input(s): "LIPASE", "AMYLASE" in the last 72 hours. CBC: Recent Labs    01/08/24 0312 01/09/24 0445  WBC 8.0 7.3  HGB 7.0* 6.9*  HCT 20.5*  19.9*  MCV 80.4 79.3*  PLT 112* 110*    Cardiac Enzymes: No results for input(s): "CKTOTAL", "CKMB", "CKMBINDEX", "TROPONINIHS" in the last 72 hours. BNP: No results for input(s): "BNP" in the last 72 hours. D-Dimer: No results for input(s): "DDIMER" in the last 72 hours. Hemoglobin A1C: No results for input(s): "HGBA1C" in the last 72 hours. Fasting Lipid Panel: Recent Labs    01/08/24 0312  TRIG 126   Thyroid Function Tests: No results for input(s): "TSH", "T4TOTAL", "T3FREE", "THYROIDAB" in the last 72 hours.  Invalid input(s): "FREET3" Anemia Panel: No results for input(s): "VITAMINB12", "FOLATE", "FERRITIN", "TIBC", "IRON", "RETICCTPCT" in the last 72 hours.   Radiology: [place] EKG SITE RITE Result Date: 01/08/2024 If Site Rite image not attached, placement could not be confirmed due to current cardiac rhythm.  ECHOCARDIOGRAM COMPLETE Result Date: 01/05/2024    ECHOCARDIOGRAM REPORT   Patient Name:   Alex EASOM Sr. Date of Exam: 01/05/2024 Medical Rec #:  [id number]          Height:       73.0 in Accession #:    [id number]         Weight:       150.1 lb Date of Birth:  [birth year]         BSA:          1.905 m Patient Age:    16 years           BP:           89/59 mmHg Patient Gender: M                  HR:           64 bpm. Exam Location:  [facility] Procedure: 2D Echo, Color Doppler and Cardiac Doppler (Both Spectral and Color            Flow Doppler were utilized during procedure). Indications:     Atrial Fibrillation I48.91  History:         Patient has no prior history of Echocardiogram examinations.                  Risk Factors:Hypertension, Dyslipidemia and Diabetes.  Sonographer:     Cristela Blue Referring Phys:  [id number] Naylee Frankowski Diagnosing Phys: [name] MD IMPRESSIONS  1. Left ventricular ejection fraction, by estimation, is 55 to 60%. The left ventricle has normal function. The left ventricle has no regional wall motion abnormalities. Left ventricular diastolic parameters are consistent with Grade I  diastolic dysfunction (impaired relaxation).  2. Right ventricular systolic function is normal. The right ventricular size is normal.  3. The mitral valve is normal in structure. Trivial mitral valve regurgitation.  4. The aortic valve is normal in structure. Aortic valve regurgitation is not visualized. FINDINGS  Left Ventricle: Left ventricular ejection fraction, by estimation, is 55 to 60%. The left ventricle has normal function. The left ventricle has no regional wall motion abnormalities. Strain was performed and the global longitudinal strain is indeterminate. The left ventricular internal cavity size was normal in size. There is borderline left ventricular hypertrophy. Left ventricular diastolic parameters are consistent with Grade I diastolic dysfunction (impaired relaxation). Right Ventricle: The right ventricular size is normal. No increase in right ventricular wall thickness. Right ventricular systolic function is normal. Left Atrium: Left atrial size was normal in size. Right Atrium: Right atrial size was normal in size. Pericardium: There is no evidence of pericardial effusion. Mitral Valve:  The mitral valve is normal in structure. Trivial mitral valve regurgitation. MV peak gradient, 2.8 mmHg. The mean mitral valve gradient is 2.0 mmHg. Tricuspid Valve: The tricuspid valve is normal in structure. Tricuspid valve regurgitation is mild. Aortic Valve: The aortic valve is normal in structure. Aortic valve regurgitation is not visualized. Aortic valve mean gradient measures 4.3 mmHg. Aortic valve peak gradient measures 8.4 mmHg. Aortic valve area, by VTI measures 1.92 cm. Pulmonic Valve: The pulmonic valve was normal in structure. Pulmonic valve regurgitation is trivial. Aorta: The ascending aorta was not well visualized. IAS/Shunts: No atrial level shunt detected by color flow Doppler. Additional Comments: 3D was performed not requiring image post processing on an independent workstation and was  indeterminate.  LEFT VENTRICLE PLAX 2D LVIDd:         4.50 cm   Diastology LVIDs:         3.20 cm   LV e' medial:    9.57 cm/s LV PW:         1.10 cm   LV E/e' medial:  6.4 LV IVS:        1.30 cm   LV e' lateral:   11.70 cm/s LVOT diam:     2.00 cm   LV E/e' lateral: 5.2 LV SV:         44 LV SV Index:   23 LVOT Area:     3.14 cm  RIGHT VENTRICLE RV Basal diam:  2.80 cm RV Mid diam:    3.00 cm RV S prime:     15.60 cm/s TAPSE (M-mode): 1.6 cm LEFT ATRIUM           Index        RIGHT ATRIUM           Index LA diam:      2.70 cm 1.42 cm/m   RA Area:     12.30 cm LA Vol (A2C): 60.9 ml 31.97 ml/m  RA Volume:   22.40 ml  11.76 ml/m LA Vol (A4C): 22.8 ml 11.97 ml/m  AORTIC VALVE AV Area (Vmax):    1.74 cm AV Area (Vmean):   1.80 cm AV Area (VTI):     1.92 cm AV Vmax:           145.33 cm/s AV Vmean:          96.400 cm/s AV VTI:            0.231 m AV Peak Grad:      8.4 mmHg AV Mean Grad:      4.3 mmHg LVOT Vmax:         80.60 cm/s LVOT Vmean:        55.100 cm/s LVOT VTI:          0.141 m LVOT/AV VTI ratio: 0.61  AORTA Ao Root diam: 3.57 cm MITRAL VALVE               TRICUSPID VALVE MV Area (PHT): 2.07 cm    TR Peak grad:   30.0 mmHg MV Area VTI:   1.92 cm    TR Vmax:        274.00 cm/s MV Peak grad:  2.8 mmHg MV Mean grad:  2.0 mmHg    SHUNTS MV Vmax:       0.83 m/s    Systemic VTI:  0.14 m MV Vmean:      58.4 cm/s   Systemic Diam: 2.00 cm MV Decel Time: 366 msec MV E velocity: 61.30 cm/s  MV A velocity: 74.40 cm/s MV E/A ratio:  0.82 [name] MD Electronically signed by [name] MD Signature Date/Time: 01/05/2024/11:39:55 PM    Final    DG Chest Port 1 View Result Date: 01/04/2024 CLINICAL DATA:  [id number].  Check endotracheal tube positioning. EXAM: PORTABLE CHEST 1 VIEW COMPARISON:  Portable chest today at 2:34 p.m. FINDINGS: 6:28 p.m. ETT tip is 5.9 cm from the carina roughly mid tracheal. NGT curves to the left and cephalad within the stomach with the tip in the proximal fundus. Left  subclavian line tip remains in the proximal SVC. Multiple overlying monitor wires. Electrical pads overlie the lower left chest. The heart is slightly enlarged. The mediastinum is normally outlined. Aortic atherosclerosis. Mild central vascular prominence is again noted without overt edema. There are small pleural effusions, overlying atelectasis or consolidation in the bases and elevated right diaphragm. Remaining lungs clear with no new or worsened findings. IMPRESSION: 1. ETT tip 5.9 cm from the carina roughly mid tracheal. 2. NGT adequately placed as above. 3. Left subclavian line tip remains in the proximal SVC. 4. Small pleural effusions, overlying atelectasis or consolidation in the bases and elevated right diaphragm. Overall aeration seems unchanged. 5. Aortic atherosclerosis. Electronically Signed   By: Almira Bar M.D.   On: 01/04/2024 20:45   DG Chest 1 View Result Date: 01/04/2024 CLINICAL DATA:  Central line placement. EXAM: CHEST  1 VIEW COMPARISON:  12/16/2022 FINDINGS: Normal sized heart. Tortuous and partially calcified thoracic aorta. Poor inspiration with interval bibasilar atelectasis. Interval left subclavian catheter with its tip in the proximal superior vena cava no pneumothorax. Diffuse osteopenia. IMPRESSION: 1. Interval left subclavian catheter placement with its tip in the proximal superior vena cava. No pneumothorax. 2. Poor inspiration with interval bibasilar atelectasis. Electronically Signed   By: Beckie Salts M.D.   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 Hypotension, tachycardia, abdominal pain and suspected anastomotic leak clinically. EXAM: CT ABDOMEN AND PELVIS WITH CONTRAST TECHNIQUE: Multidetector CT imaging of the abdomen and pelvis was performed using the standard protocol following bolus  administration of intravenous contrast. RADIATION DOSE REDUCTION: This exam was performed according to the departmental dose-optimization program which includes automated exposure control, adjustment of the mA and/or kV according to patient size and/or use of iterative reconstruction technique. CONTRAST:  OMNIPAQUE IOHEXOL 300 MG/ML  SOLN COMPARISON:  12/29/2023 FINDINGS: Lower chest: Small bilateral pleural effusions, right greater than left with associated bibasilar atelectasis. Hepatobiliary: No focal liver abnormality is seen. Some gallbladder distension without overt gallbladder inflammation, visible calculi or biliary ductal dilatation. Pancreas: Atrophic pancreas without inflammation or masses. No pancreatic ductal dilatation. Spleen: Normal in size without focal abnormality. Adrenals/Urinary Tract: Adrenal glands are unremarkable. Kidneys are normal, without renal calculi, focal lesion, or hydronephrosis. Tiny amount of air in the bladder lumen likely relates to prior catheterization. Stomach/Bowel: Small amount of fluid in the stomach. Air in multiple small bowel loops with mild small bowel dilatation approaching 4 cm in maximum diameter. Findings are consistent with ileus. At the level of the right-sided ileocolic anastomosis located just along the inferior aspect of the gallbladder, there is suspicion by CT of an anastomotic leak with visible air communicating from the anterior aspect of the anastomosis to a large pocket of air and fluid in the anterior right perihepatic space continuing superiorly to the  subphrenic space. This pocket of fluid and air measures up to 12.5 cm in transverse diameter and 16 cm in height. Multiple other pockets of free air are present in the anterior peritoneal cavity including the left subphrenic space superior to the stomach. Multiple areas of associated free fluid throughout the peritoneal cavity with the second largest area of fluid along the lateral left upper  quadrant and left subphrenic space abutting the spleen. Dependent fluid also present in the pelvis. Vascular/Lymphatic: Stable severe atherosclerosis of the abdominal aorta without aneurysmal disease. No enlarged lymph nodes identified in the abdomen or pelvis. Reproductive: Prostate is unremarkable. Other: No hernias. Musculoskeletal: No acute or significant osseous findings. IMPRESSION: 1. Findings consistent with anastomotic leak at the level of the right-sided ileocolic anastomosis located just along the inferior aspect of the gallbladder. There is visible air communicating from the anterior aspect of the anastomosis to a large pocket of air and fluid in the anterior right perihepatic space continuing superiorly to the subphrenic space. This pocket of fluid and air measures up to 12.5 cm in transverse diameter and 16 cm in height. Multiple other pockets of free air are present in the anterior peritoneal cavity including the left subphrenic space superior to the stomach. Multiple areas of associated free fluid throughout the peritoneal cavity with the second largest area of fluid along the lateral left upper quadrant and left subphrenic space abutting the spleen. Dependent fluid also present in the pelvis. 2. Small bilateral pleural effusions, right greater than left with associated bibasilar atelectasis. 3. Atrophic pancreas without inflammation or masses. 4. Stable severe atherosclerosis of the abdominal aorta without aneurysmal disease. 5. I reviewed findings directly in person with Dr. Maurine Minister shortly after the CT was performed. Electronically Signed   By: Irish Lack M.D.   On: 01/04/2024 13:35   DG Abd 1 View Result Date: 01/04/2024 CLINICAL DATA:  Abdominal distention. Postoperative day 4 from exploratory laparotomy and right hemicolectomy. EXAM: ABDOMEN - 1 VIEW COMPARISON:  01/01/2024 FINDINGS: Gas dilated small bowel loops are identified in the left abdomen measuring up to 4.7 cm diameter. Gas  dilated loops in the lower left abdomen and right abdomen are less distended. No substantial colonic gas. Rigler Sign associated with small bowel in the left abdomen and lucency over the liver is consistent with intraperitoneal free air. Surgical skin staples overlie the midline. IMPRESSION: 1. Gas dilated small bowel loops in the left abdomen with less distended loops in the lower left abdomen and right abdomen. Given the patient is 4 days out from surgery, features are felt to be compatible with postoperative ileus. As of all vein obstruction is not excluded, close follow-up warranted. 2. Intraperitoneal free air is not unexpected given recent surgery. Electronically Signed   By: Kennith Center M.D.   On: 01/04/2024 08:23   DG Abd Portable 1V Result Date: 01/01/2024 CLINICAL DATA:  Nasogastric tube placement. Radiologic records indicates laparotomy yesterday. EXAM: PORTABLE ABDOMEN - 1 VIEW COMPARISON:  Radiograph 12/30/2023 FINDINGS: Tip and side port of the enteric tube below the diaphragm in the stomach. No bowel dilatation in the upper abdomen. Midline abdominal staples seen. Free air under the right hemidiaphragm is not unexpected given recent abdominal surgery. IMPRESSION: 1. Tip and side port of the enteric tube below the diaphragm in the stomach. 2. Free air under the right hemidiaphragm is not unexpected given recent abdominal surgery. Electronically Signed   By: Narda Rutherford M.D.   On: 01/01/2024 21:39   DG Abd 1  View Result Date: 12/30/2023 CLINICAL DATA:  Nasogastric tube placement. EXAM: ABDOMEN - 1 VIEW COMPARISON:  Earlier today FINDINGS: Tip and side port of the enteric tube below the diaphragm in the stomach. Gaseous small bowel distension centrally. IMPRESSION: Tip and side port of the enteric tube below the diaphragm in the stomach. Electronically Signed   By: Narda Rutherford M.D.   On: 12/30/2023 18:02   DG Abd 1 View Result Date: 12/30/2023 CLINICAL DATA:  Abdominal distention,  watery stools. EXAM: ABDOMEN - 1 VIEW COMPARISON:  CT abdomen pelvis dated 12/29/2023 FINDINGS: Multiple dilated loops of small bowel are seen in the midabdomen, similar to prior exam accounting for differences in technique. Gas overlies the rectum. Degenerative changes are seen in the spine. IMPRESSION: Multiple dilated loops of small bowel in the midabdomen, similar to prior exam accounting for differences in technique. These findings likely reflect bowel obstruction. Electronically Signed   By: Romona Curls M.D.   On: 12/30/2023 16:59   CT ABDOMEN PELVIS W CONTRAST Result Date: 12/29/2023 CLINICAL DATA:  Nausea, vomiting, generalized abdominal pain for 1-2 days. Recent diagnosis of colon cancer. Rule out obstruction. EXAM: CT ABDOMEN AND PELVIS WITH CONTRAST TECHNIQUE: Multidetector CT imaging of the abdomen and pelvis was performed using the standard protocol following bolus administration of intravenous contrast. RADIATION DOSE REDUCTION: This exam was performed according to the departmental dose-optimization program which includes automated exposure control, adjustment of the mA and/or kV according to patient size and/or use of iterative reconstruction technique. CONTRAST:  OMNIPAQUE IOHEXOL 300 MG/ML  SOLN COMPARISON:  CT abdomen and pelvis 10/14/2023 FINDINGS: Lower chest: No acute abnormality. Hepatobiliary: No focal hepatic lesion. Unremarkable gallbladder and biliary tree. Pancreas: Unremarkable. Spleen: Unremarkable. Adrenals/Urinary Tract: Stable adrenal glands. No urinary calculi or hydronephrosis. Unremarkable bladder. Stomach/Bowel: Stomach is within normal limits. Fluid-filled mid and distal small bowel at the upper limits of normal in caliber and increased compared to 10/14/2023. The cecum and ascending colon are fluid-filled with abrupt transition point in the proximal transverse colon at the site of the mass seen on colonoscopy 12/20/2023. There is increased wall thickening of the cecum  and ascending colon compared to 10/14/2023. There are few additional segments of wall thickening in the mid and distal small bowel. Diverticulosis in the descending colon. Trace adjacent stranding and fluid. Vascular/Lymphatic: Aortic atherosclerosis. No enlarged abdominal or pelvic lymph nodes. Reproductive: No acute abnormality. Other: No free intraperitoneal air. Musculoskeletal: No acute fracture.  No destructive osseous lesion. IMPRESSION: 1. Worsening obstruction in the proximal transverse colon secondary to the known adenocarcinoma compared with CT 10/14/2023. 2. Increased wall thickening of the cecum and ascending colon compared to 10/14/2023. There are few additional segments of wall thickening in the mid and distal small bowel. Findings are favored to represent nonspecific enterocolitis. 3. Question mild diverticulitis in the descending colon. 4. Aortic Atherosclerosis (ICD10-I70.0). Electronically Signed   By: Minerva Fester M.D.   On: 12/29/2023 19:18    ECHO as above  TELEMETRY reviewed by me 01/09/2024: Atrial fibrillation rate 140s  EKG reviewed by me: None for review in chart, telemetry strip from 8:51 this a.m. with rapid atrial fibrillation rate 190  Data reviewed by me 01/09/2024: last 24h vitals tele labs imaging I/O ED provider note, admission H&P, general surgery notes, hospitalist progress note  Principal Problem:   Large bowel obstruction (HCC) Active Problems:   HTN (hypertension), benign   Type 2 diabetes mellitus (HCC)   Malignant neoplasm of transverse colon (HCC)  Dementia with behavioral disturbance (HCC)   Electrolyte abnormality   Colon adenocarcinoma (HCC)   Palliative care encounter   Hypotension   Paroxysmal atrial fibrillation with RVR (HCC)   Protein-calorie malnutrition, severe   Septic shock (HCC)    ASSESSMENT AND PLAN:  Kerrington Greenhalgh. is a 78 y.o. male  with a past medical history of hypertension, type II diabetes, lewy body dementia,  adenocarcinoma of colon who presented to the ED on 12/29/2023 for abdominal pain. Underwent ex lap with R hemicolectomy 12/31/2023. Earlier this AM he developed AF RVR and hypotension. Cardiology was consulted for further evaluation.   # Atrial fibrillation RVR # New onset atrial fibrillation # Lactic acidosis # S/p R hemicolectomy Patient initially presented to the ED with complaints of abdominal pain, taken for exploratory laparotomy 3/2 and underwent right hemicolectomy for adenocarcinoma of the colon.  Recovering well until this morning when he developed atrial fibrillation RVR and hypotension.  No prior history of A-fib.  Rapid response was called and he was transferred to the ICU. S/p ex lap for repair of anastomotic leak with colostomy 01/04/24.  -Continue amiodarone 100 mg daily for rate/rhythm control. -AC held given anemia.  -Further recommendations per general surgery, primary team. -No plan for further cardiac diagnostics at this time.  Cardiology will sign off. Please haiku with questions or re-engage if needed.    This patient's plan of care was discussed and created with Dr. Melton Alar and she is in agreement.  Signed: Gale Journey, PA-C  01/09/2024, 1:35 PM [facility]

## 2024-01-09 NOTE — Progress Notes (Signed)
 Pt transported to room 237 on telemetry and O2. Blood, TPN, and antibiotics continue to infuse as previously. Final hand-off completed with French Ana at bedside. Family at bedside. External catheter reconnected to suction.

## 2024-01-09 NOTE — Progress Notes (Signed)
 Report called to French Ana, care RN for room 237.

## 2024-01-09 NOTE — Progress Notes (Signed)
 [facility] SURGICAL PROGRESS NOTE  Hospital Day(s): 11.   Post op day(s): 5 Days Post-Op.   Interval History:  Patient seen and examined No acute events overnight Leukocytosis remains resolved; WBC 7.3K Hgb to 6.9; slow down trend Renal function normal; sCr - 0.77; UO - 1200 ccs Hypokalemia to 3.2 Drain with 0 ccs out recorded; serous  Ileostomy with stool in bag, no gas  Continues on Zosyn/Diflucan   Vital signs in last 24 hours: [min-max] current  Temp:  [97.7 F (36.5 C)-98.6 F (37 C)] 98.6 F (37 C) (03/11 0809) Pulse Rate:  [34-102] 64 (03/11 0700) Resp:  [14-29] 14 (03/11 0700) BP: (112-171)/(49-99) 124/60 (03/11 0700) SpO2:  [91 %-98 %] 93 % (03/11 0700) Weight:  [73.1 kg] 73.1 kg (03/11 0336)     Height: 6\' 1"  (185.4 cm) Weight: 73.1 kg BMI (Calculated): 21.27   Intake/Output last 2 shifts:  03/10 0701 - 03/11 0700 In: 1951.7 [I.V.:1398.8; IV Piggyback:552.8] Out: 1235 [Urine:1200; Stool:35]   Physical Exam:  Constitutional: Alert, less confused, arouses appropriately, NAD Respiratory: breathing non-labored at rest, on [place] Cardiovascular: Irregularly irregular, normal rate at 80 bpm Gastrointestinal: soft, expected tenderness, non-distended, no rebound/guarding. 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 Mucus fistula in left abdomen; scant bowel sweat in bag. Drain in right lateral abdomen; serous.  Integumentary: Midline wound healing via secondary intention, fascia intact  Labs:     Latest Ref Rng & Units 01/09/2024    4:45 AM 01/08/2024    3:12 AM 01/07/2024    4:20 AM  CBC  WBC 4.0 - 10.5 K/uL 7.3  8.0  9.5   Hemoglobin 13.0 - 17.0 g/dL 6.9  7.0  7.3   Hematocrit 39.0 - 52.0 % 19.9  20.5  21.4   Platelets 150 - 400 K/uL 110  112  116       Latest Ref Rng & Units 01/09/2024    4:45 AM 01/08/2024    3:12 AM 01/07/2024    4:20 AM  CMP  Glucose 70 - 99 mg/dL 161  096    045  409   BUN 8 - 23 mg/dL 39  48    48  52    Creatinine 0.61 - 1.24 mg/dL 8.11  9.14    7.82  9.56   Sodium 135 - 145 mmol/L 131  133    133  132   Potassium 3.5 - 5.1 mmol/L 3.2  3.5    3.5  3.8   Chloride 98 - 111 mmol/L 101  103    102  103   CO2 22 - 32 mmol/L 23  23    23  24    Calcium 8.9 - 10.3 mg/dL 7.4  7.1    7.1  6.8   Total Protein 6.5 - 8.1 g/dL  4.2    Total Bilirubin 0.0 - 1.2 mg/dL  0.5    Alkaline Phos 38 - 126 U/L  143    AST 15 - 41 U/L  33    ALT 0 - 44 U/L  22       Imaging studies: No new pertinent imaging studies   Assessment/Plan: 78 y.o. male 5 Days Post-Op s/p exploratory laparotomy with resection of right ileocolonic anastomosis with creation of end ileostomy and mucus fistula for anastomotic leak following initial right hemicolectomy for colon CA   - Continue CLD for now - Continue TPN; monitor electrolytes  - Continue antimicrobial (Zosyn, Diflucan) - Monitor  abdominal examination - Monitor ostomy outputs; WOC following  - Wound Care: Pack midline wound with gauze, cover, secure. Change daily and as needed - Pain control prn; antiemetics prn - Work with therapies as feasible; anticipate significant deconditioning    - Appreciate cardiology input   - Further management per primary service; we will follow   All of the above findings and recommendations were discussed with the patient, and the medical team, and all of patient's questions were answered to his expressed satisfaction.  -- Lynden Oxford, PA-C [facility] 01/09/2024, 9:12 AM M-F: 7am - 4pm

## 2024-01-09 NOTE — Progress Notes (Signed)
 Progress Note   Patient: Alex PRITCHARD Sr. WJX:[id number] DOB: 06-Mar-1946 DOA: 12/29/2023     11 DOS: the patient was seen and examined on 01/09/2024   Brief hospital course: Alex Rekowski. is a 78 y.o. male with medical history significant for DM, HTN, Lewy body dementia, recently diagnosed adenocarcinoma of the colon on colonoscopy 12/20/2023 that was done for iron deficiency anemia and abdominal pain.  She saw her oncologist on 12/27/2023 to discuss therapy for newly diagnosed colon cancer.  He presented to the hospital with nausea, vomiting and worsening right lower quadrant abdominal pain, found to have large bowel obstruction.    Patient underwent exploratory laparotomy with right hemicolectomy and primary anastomosis on 12/31/2023.  Hospital course was complicated by anastomotic leak.  He was taken back to the OR for resection with ileostomy and mucous fistula placement on 01/04/2024.  He was transferred to the ICU for postoperative care because he could not be extubated after surgery.  He required vasopressors for septic shock in the ICU.  Also had atrial fibrillation with RVR and required IV amiodarone drip.  He was eventually extubated and taken off vasopressors. Patient subsequently transferred to the hospitalist service on 01/07/2024.  Assessment and Plan: Large bowel obstruction, recently diagnosed transverse colon adenocarcinoma: S/p ex laparotomy, right hemicolectomy with primary anastomosis on 12/31/2023.   S/p repair of anastomotic leak resection with ileostomy and mucous fistula placement on 01/04/2024 Continue empiric IV Zosyn and IV fluconazole Continue TPN for nutrition Analgesics as needed for pain. Can move him out of SDU to PCU.   Acute postoperative anemia of blood loss on chronic iron deficiency anemia:  Hemoglobin down to 6.9 today.  Continue to monitor H/H daily. One unit PRBC transfusion ordered.   Septic shock secondary to perforated colon/peritonitis: Continue IV Zosyn  and IV fluconazole. He is off of vasopressors. Continue to monitor vitals closely.   Atrial fibrillation with RVR: Heart rate around 100.  Continue IV amiodarone drip as per cardiology recommendations. Continue telemetry monitoring   Postop ventilatory requirement: S/p extubation.  Continue 2-3 L/min oxygen via [place].   AKI: Improved.  Discontinue IV fluids, continue PPN   Severe protein calorie malnutrition, severe hypoalbuminemia:  BMI 19.92.  He is eating poorly. Continue TPN, after PICC line replacement   Hypokalemia: electrolyte repletion per pharmacy. Hyponatremia: Seems chronic, stable.   Monitor electrolytes and replete as needed   Type II DM: Continue NovoLog correction scale as needed for hyperglycemia. Hypoglycemia protocol   Hypertension: BP better.  Will consider resuming losartan.   Lewy body dementia: No acute issues. Nursing supportive care  Fall, aspiration precautions. DVT prophylaxis   Code Status: Full Code  Subjective: Patient is seen and examined today morning.  Patient is sleeping comfortably.  He is eating poor, has intermittent nausea. Mittens for safety.   Physical Exam: Vitals:   01/09/24 1430 01/09/24 1445 01/09/24 1500 01/09/24 1515  BP: (!) 130/59 (!) 143/83 (!) 125/57 (!) 140/57  Pulse: 66 70    Resp: 19 (!) 22 16 (!) 22  Temp:      TempSrc:      SpO2: 90% 96%    Weight:      Height:        General - Elderly thin built Caucasian male, no apparent distress HEENT - PERRLA, EOMI, atraumatic head, non tender sinuses. Lung - Clear, basal rales, rhonchi, no wheezes. Heart - S1, S2 heard, no murmurs, rubs, trace pedal edema. Abdomen - Soft, non tender, drain,  ileostomy bag with stool right lower quadrant, midline dressing noted. Neuro - Alert, awake and able to follow simple commands, non focal exam. Skin - Warm and dry.  Data Reviewed:      Latest Ref Rng & Units 01/09/2024    4:45 AM 01/08/2024    3:12 AM 01/07/2024    4:20 AM  CBC   WBC 4.0 - 10.5 K/uL 7.3  8.0  9.5   Hemoglobin 13.0 - 17.0 g/dL 6.9  7.0  7.3   Hematocrit 39.0 - 52.0 % 19.9  20.5  21.4   Platelets 150 - 400 K/uL 110  112  116       Latest Ref Rng & Units 01/09/2024    4:45 AM 01/08/2024    3:12 AM 01/07/2024    4:20 AM  BMP  Glucose 70 - 99 mg/dL 295  621    308  657   BUN 8 - 23 mg/dL 39  48    48  52   Creatinine 0.61 - 1.24 mg/dL 8.46  9.62    9.52  8.41   Sodium 135 - 145 mmol/L 131  133    133  132   Potassium 3.5 - 5.1 mmol/L 3.2  3.5    3.5  3.8   Chloride 98 - 111 mmol/L 101  103    102  103   CO2 22 - 32 mmol/L 23  23    23  24    Calcium 8.9 - 10.3 mg/dL 7.4  7.1    7.1  6.8    [place] EKG SITE RITE Result Date: 01/08/2024 If Site Rite image not attached, placement could not be confirmed due to current cardiac rhythm.   Disposition: Status is: Inpatient Remains inpatient appropriate because: Status post ex lap, complicated hospital stay, weak, on TPN  Planned Discharge Destination: Home with Home Health    MDM level 3: Patient had complicated hospital stay status post ex lap right hemicolectomy, anastomotic leak, ileostomy.  Patient is on TPN, IV antibiotic therapy.  He is still weak, confused.  He has A-fib RVR on amiodarone drip.  Patient will need close monitoring and is at high risk for sudden clinical deterioration.  Author: Marcelino Duster, MD 01/09/2024 3:30 PM Secure chat 7am to 7pm For on call review [email or web address].

## 2024-01-09 NOTE — Plan of Care (Signed)
  Problem: Education: Goal: Ability to describe self-care measures that may prevent or decrease complications (Diabetes Survival Skills Education) will improve Outcome: Progressing   Problem: Education: Goal: Ability to describe self-care measures that may prevent or decrease complications (Diabetes Survival Skills Education) will improve Outcome: Progressing   Problem: Fluid Volume: Goal: Ability to maintain a balanced intake and output will improve Outcome: Progressing   Problem: Fluid Volume: Goal: Ability to maintain a balanced intake and output will improve Outcome: Progressing   Problem: Metabolic: Goal: Ability to maintain appropriate glucose levels will improve Outcome: Progressing   Problem: Metabolic: Goal: Ability to maintain appropriate glucose levels will improve Outcome: Progressing   Problem: Nutritional: Goal: Maintenance of adequate nutrition will improve Outcome: Progressing Goal: Progress toward achieving an optimal weight will improve Outcome: Progressing   Problem: Nutritional: Goal: Maintenance of adequate nutrition will improve Outcome: Progressing   Problem: Nutritional: Goal: Progress toward achieving an optimal weight will improve Outcome: Progressing

## 2024-01-10 ENCOUNTER — Inpatient Hospital Stay

## 2024-01-10 DIAGNOSIS — J9601 Acute respiratory failure with hypoxia: Secondary | ICD-10-CM

## 2024-01-10 DIAGNOSIS — J9602 Acute respiratory failure with hypercapnia: Secondary | ICD-10-CM | POA: Diagnosis not present

## 2024-01-10 DIAGNOSIS — I4891 Unspecified atrial fibrillation: Secondary | ICD-10-CM

## 2024-01-10 DIAGNOSIS — K56609 Unspecified intestinal obstruction, unspecified as to partial versus complete obstruction: Secondary | ICD-10-CM | POA: Diagnosis not present

## 2024-01-10 DIAGNOSIS — E878 Other disorders of electrolyte and fluid balance, not elsewhere classified: Secondary | ICD-10-CM

## 2024-01-10 LAB — GLUCOSE, CAPILLARY
Glucose-Capillary: 238 mg/dL — ABNORMAL HIGH (ref 70–99)
Glucose-Capillary: 187 mg/dL — ABNORMAL HIGH (ref 70–99)
Glucose-Capillary: 245 mg/dL — ABNORMAL HIGH (ref 70–99)
Glucose-Capillary: 239 mg/dL — ABNORMAL HIGH (ref 70–99)
Glucose-Capillary: 228 mg/dL — ABNORMAL HIGH (ref 70–99)
Glucose-Capillary: 182 mg/dL — ABNORMAL HIGH (ref 70–99)
Glucose-Capillary: 175 mg/dL — ABNORMAL HIGH (ref 70–99)

## 2024-01-10 LAB — BLOOD GAS, ARTERIAL
Acid-base deficit: 5.9 mmol/L — ABNORMAL HIGH (ref 0.0–2.0)
Bicarbonate: 23.5 mmol/L (ref 20.0–28.0)
O2 Content: 15 L/min
O2 Saturation: 83.9 %
Patient temperature: 37
pCO2 arterial: 63 mmHg — ABNORMAL HIGH (ref 32–48)
pH, Arterial: 7.18 — CL (ref 7.35–7.45)
pO2, Arterial: 57 mmHg — ABNORMAL LOW (ref 83–108)

## 2024-01-10 LAB — RENAL FUNCTION PANEL
Albumin: 1.6 g/dL — ABNORMAL LOW (ref 3.5–5.0)
Anion gap: 9 (ref 5–15)
BUN: 34 mg/dL — ABNORMAL HIGH (ref 8–23)
CO2: 21 mmol/L — ABNORMAL LOW (ref 22–32)
Calcium: 7.6 mg/dL — ABNORMAL LOW (ref 8.9–10.3)
Chloride: 103 mmol/L (ref 98–111)
Creatinine, Ser: 0.75 mg/dL (ref 0.61–1.24)
GFR, Estimated: >60 mL/min (ref >60)
Glucose, Bld: 222 mg/dL — ABNORMAL HIGH (ref 70–99)
Phosphorus: 2.3 mg/dL — ABNORMAL LOW (ref 2.5–4.6)
Potassium: 3.6 mmol/L (ref 3.5–5.1)
Sodium: 133 mmol/L — ABNORMAL LOW (ref 135–145)

## 2024-01-10 LAB — CBC
HCT: 24.8 % — ABNORMAL LOW (ref 39.0–52.0)
Hemoglobin: 8.4 g/dL — ABNORMAL LOW (ref 13.0–17.0)
MCH: 28 pg (ref 26.0–34.0)
MCHC: 33.9 g/dL (ref 30.0–36.0)
MCV: 82.7 fL (ref 80.0–100.0)
Platelets: 142 10*3/uL — ABNORMAL LOW (ref 150–400)
RBC: 3 MIL/uL — ABNORMAL LOW (ref 4.22–5.81)
RDW: 15.9 % — ABNORMAL HIGH (ref 11.5–15.5)
WBC: 7.6 10*3/uL (ref 4.0–10.5)
nRBC: 0 % (ref 0.0–0.2)

## 2024-01-10 LAB — TYPE AND SCREEN
ABO/RH(D): O POS
Antibody Screen: NEGATIVE
Unit division: 0

## 2024-01-10 LAB — BPAM RBC
Blood Product Expiration Date: 202503272359
ISSUE DATE / TIME: 202503111543
Unit Type and Rh: 5100

## 2024-01-10 MED ORDER — ACETAMINOPHEN 325 MG PO TABS: 650.0000 mg | ORAL_TABLET | Freq: Four times a day (QID) | ORAL | Status: DC | PRN

## 2024-01-10 MED ORDER — GLYCOPYRROLATE 0.2 MG/ML IJ SOLN: 0.2000 mg | INTRAMUSCULAR | Status: DC | PRN

## 2024-01-10 MED ORDER — SODIUM CHLORIDE 0.9% FLUSH: 3.0000 mL | INTRAVENOUS | Status: DC | PRN

## 2024-01-10 MED ORDER — GLYCOPYRROLATE 1 MG PO TABS: 1.0000 mg | ORAL_TABLET | ORAL | Status: DC | PRN

## 2024-01-10 MED ORDER — MORPHINE 100MG IN NS 100ML (1MG/ML) PREMIX INFUSION
0.0000 mg/h | INTRAVENOUS | Status: DC
  Administered 2024-01-10: 5 mg/h via INTRAVENOUS
  Filled 2024-01-10: qty 100

## 2024-01-10 MED ORDER — LORAZEPAM 2 MG/ML IJ SOLN
2.0000 mg | INTRAMUSCULAR | Status: DC | PRN
  Administered 2024-01-11: 2 mg via INTRAVENOUS
  Filled 2024-01-10: qty 1

## 2024-01-10 MED ORDER — MORPHINE BOLUS VIA INFUSION
5.0000 mg | INTRAVENOUS | Status: DC | PRN
  Administered 2024-01-11 (×2): 5 mg via INTRAVENOUS

## 2024-01-10 MED ORDER — SODIUM CHLORIDE 0.9% FLUSH: 3.0000 mL | Freq: Two times a day (BID) | INTRAVENOUS | Status: DC

## 2024-01-10 MED ORDER — ACETAMINOPHEN 650 MG RE SUPP: 650.0000 mg | Freq: Four times a day (QID) | RECTAL | Status: DC | PRN

## 2024-01-10 MED ORDER — HYDROMORPHONE HCL-NACL 50-0.9 MG/50ML-% IV SOLN: 0.0000 mg/h | INTRAVENOUS | Status: DC

## 2024-01-10 MED ORDER — GLYCOPYRROLATE 0.2 MG/ML IJ SOLN
0.2000 mg | INTRAMUSCULAR | Status: DC | PRN
  Administered 2024-01-10: 0.2 mg via INTRAVENOUS
  Filled 2024-01-10: qty 1

## 2024-01-10 MED ORDER — FAT EMUL FISH OIL/PLANT BASED 20% (SMOFLIPID)IV EMUL
250.0000 mL | INTRAVENOUS | Status: DC
Start: 2024-01-10 — End: 2024-01-10
  Administered 2024-01-10: 250 mL via INTRAVENOUS
  Filled 2024-01-10: qty 250

## 2024-01-10 MED ORDER — TRACE MINERALS CU-MN-SE-ZN 300-55-60-3000 MCG/ML IV SOLN
INTRAVENOUS | Status: DC
  Filled 2024-01-10: qty 2000

## 2024-01-10 MED ORDER — POLYVINYL ALCOHOL 1.4 % OP SOLN: 1.0000 [drp] | Freq: Four times a day (QID) | OPHTHALMIC | Status: DC | PRN

## 2024-01-10 MED ORDER — HYDROMORPHONE BOLUS VIA INFUSION: 1.0000 mg | INTRAVENOUS | Status: DC | PRN

## 2024-01-10 NOTE — Plan of Care (Signed)

## 2024-01-10 NOTE — Progress Notes (Signed)
 PHARMACY - TOTAL PARENTERAL NUTRITION CONSULT NOTE   Indication: without adequate nutrition for 7 days   Patient Measurements: Height: 6\' 1"  (185.4 cm) Weight: 73.1 kg (161 lb 2.5 oz) IBW/kg (Calculated) : 79.9 TPN AdjBW (KG): 69.2 Body mass index is 21.26 kg/m.  Assessment: 78 y/o male with h/o HTN, HLD, DM, dementia, diverticulitis, GERD, HLD, DJD and inguinal hernia s/p repair who is admitted with LBO secondary to colon mass (Stage IIa colon adenocarcinoma) s/p exploratory laparotomy with right hemicolectomy with primary anastomosis 3/2 complicated by post op ileus and new onset atrial fibrillation with RVR.   Glucose / Insulin: SSI required/previous 24h: 14 units (BG remains > 200) Electrolytes: WNL Renal: SCr 0.66-->1.09 > 0.77 Hepatic: AST>ALT: improving Intake / Output;  03/10 0701 - 03/11 0700 In: 1951.7 [I.V.:1398.8; IV Piggyback:552.8] Out: 1235 [Urine:1200; Stool:35] GI Imaging: 03/06 CT pelvis: anastomotic leak, Atrophic pancreas without inflammation   GI Surgeries / Procedures:  S/p ex-lap, right colectomy, anastamotic leak s/p resection with ileostomy and mucous fistula placement.   Central access: 01/04/24 TPN start date: 01/05/24  RD Assessment: Estimated Needs Total Energy Estimated Needs: 2000-2300kcal/day Total Protein Estimated Needs: 100-115g/day Total Fluid Estimated Needs: 1.8-2.1L/day  Current Nutrition: clear liquid, with poor oral intake  Plan: (recommendations discussed with clinical dietitian as well) Continue Clinimix E8/10 TPN at 58mL/hr at 1800 Electrolytes in TPN(standard): Na 13mEq/L, K 55mEq/L, Ca 4.62mEq/L, Mg 44mEq/L, and Phos 28mmol/L. Cl:Ac 0.49 Nutritional components: Dextrose 680 kcal (200.8 g),Protein 640 kcal (160 g),Total Kcal: 1834 Add standard MVI, thiamine 100 mg, trace elements, and insulin aspart 10 units to TPN Continue  q4h SSI and adjust as needed  Monitor TPN labs on Mon/Thurs, daily until stable  Jarren Para Rodriguez-Guzman  PharmD, BCPS 01/10/2024 8:42 AM

## 2024-01-10 NOTE — IPAL (Signed)
  Interdisciplinary Goals of Care Family Meeting   Date carried out: 01/10/2024  Location of the meeting: {IDGC Location:21390}  Member's involved: {IDGC Participants:[id number]::"Physician","Bedside Registered Nurse","Social Worker","Family Member or next of kin"}  Durable Power of Insurance risk surveyor: ***    Discussion: We discussed goals of care for Alex Silversmith Sr. .  ***  Code status:   Code Status: Do not attempt resuscitation (DNR) - Comfort care   Disposition: {IDGC Disposition:21372}  Time spent for the meeting: ***    Alex Byars Rust-Chester, NP  01/10/2024, 10:36 PM

## 2024-01-10 NOTE — Progress Notes (Signed)
 RT paged to patient bedside for assessment. Patient found on 6L HFNC, SAT 82%. Patient has coarse crackle breath sounds. Mouth suctioned with bright yellow secretions returned. Rapid response called. Patient placed on NRBM at 15L with SAT improving to 98%, RN and MD at bedside.

## 2024-01-10 NOTE — Progress Notes (Signed)
 eLink Physician-Brief Progress Note Patient Name: Alex Soto DOB: 1946-01-11 MRN: [id number]   Date of Service  01/10/2024  HPI/Events of Note  Patient with post-operative respiratory failure requiring mechanical ventilation who was successfully extubated,  who developed hypoxemia in the context of suspected aspiration after placement of an NG tube, and was transferred to the ICU as an RRT.  eICU Interventions  New Patient Evaluation.        Migdalia Dk 01/10/2024, 10:25 PM

## 2024-01-10 NOTE — Progress Notes (Signed)
 Progress Note   Patient: Alex GLASHEEN Sr. UJW:[id number] DOB: 1945-12-09 DOA: 12/29/2023     12 DOS: the patient was seen and examined on 01/10/2024   Brief hospital course: Kashius Dominic. is a 78 y.o. male with medical history significant for DM, HTN, Lewy body dementia, recently diagnosed adenocarcinoma of the colon on colonoscopy 12/20/2023 that was done for iron deficiency anemia and abdominal pain.  She saw her oncologist on 12/27/2023 to discuss therapy for newly diagnosed colon cancer.  He presented to the hospital with nausea, vomiting and worsening right lower quadrant abdominal pain, found to have large bowel obstruction.    Patient underwent exploratory laparotomy with right hemicolectomy and primary anastomosis on 12/31/2023.  Hospital course was complicated by anastomotic leak.  He was taken back to the OR for resection with ileostomy and mucous fistula placement on 01/04/2024.  He was transferred to the ICU for postoperative care because he could not be extubated after surgery.  He required vasopressors for septic shock in the ICU.  Also had atrial fibrillation with RVR and required IV amiodarone drip.  He was eventually extubated and taken off vasopressors. Patient subsequently transferred to the hospitalist service on 01/07/2024.  Assessment and Plan: Large bowel obstruction, recently diagnosed transverse colon adenocarcinoma: S/p ex laparotomy, right hemicolectomy with primary anastomosis on 12/31/2023.   S/p repair of anastomotic leak resection with ileostomy and mucous fistula placement on 01/04/2024 Continue empiric IV Zosyn and IV fluconazole Continue TPN for nutrition Analgesics as needed for pain. Can move him out of SDU to PCU.   Acute postoperative anemia of blood loss on chronic iron deficiency anemia:  Hemoglobin 8.4 s/p  one unit PRBC transfusion. Continue to monitor h/h.   Septic shock secondary to perforated colon/peritonitis: Continue IV Zosyn and IV fluconazole. He  is off of vasopressors. Continue to monitor vitals closely.   Atrial fibrillation with RVR: Heart rate around 60-90.  Continue amiodarone 100 mg as per cardiology recommendations. Continue telemetry monitoring.   Postop ventilatory requirement: S/p extubation.   Has hypoxia. Continue 3-6 L/min oxygen via [place].   AKI: Improved.  Discontinue IV fluids, continue PPN   Severe protein calorie malnutrition, severe hypoalbuminemia:  BMI 19.92.  He is eating poorly. Continue TPN, after PICC line replacement   Hypokalemia: electrolyte repletion per pharmacy. Hyponatremia: Seems chronic, stable.   Monitor electrolytes and replete as needed   Type II DM: Continue NovoLog correction scale as needed for hyperglycemia. Hypoglycemia protocol   Hypertension: BP better.  Will consider resuming losartan.   Lewy body dementia: No acute issues. Nursing supportive care  Fall, aspiration precautions. DVT prophylaxis   Code Status: Full Code  Subjective: Patient is seen and examined today morning.  Patient is sleeping comfortably.  He is requiring 3-6L supplemental o2. Eating poor, has intermittent nausea. Mittens for safety.   Physical Exam: Vitals:   01/10/24 1215 01/10/24 1510 01/10/24 1938 01/10/24 2104  BP: (!) 146/66 (!) 155/60 (!) 171/66 (!) 153/51  Pulse: 71 94 90 81  Resp: 18 18 20    Temp: 98.1 F (36.7 C) 98 F (36.7 C) 98.3 F (36.8 C)   TempSrc: Axillary Axillary    SpO2: 95% 93% 97% 92%  Weight:      Height:        General - Elderly thin built Caucasian male, mild respiratory distress HEENT - PERRLA, EOMI, atraumatic head, non tender sinuses. Lung - Clear, basal rales, rhonchi, no wheezes. Heart - S1, S2 heard, no murmurs,  rubs, right>left pedal edema. Abdomen - Soft, non tender, drain, ileostomy bag with stool right lower quadrant, open midline wound with dressing noted. Neuro - sleeping, arousable, follows simple commands, non focal exam. Skin - Warm and dry.  Data  Reviewed:      Latest Ref Rng & Units 01/10/2024    4:54 AM 01/09/2024    4:45 AM 01/08/2024    3:12 AM  CBC  WBC 4.0 - 10.5 K/uL 7.6  7.3  8.0   Hemoglobin 13.0 - 17.0 g/dL 8.4  6.9  7.0   Hematocrit 39.0 - 52.0 % 24.8  19.9  20.5   Platelets 150 - 400 K/uL 142  110  112       Latest Ref Rng & Units 01/10/2024    4:54 AM 01/09/2024    4:45 AM 01/08/2024    3:12 AM  BMP  Glucose 70 - 99 mg/dL 536  644  034    742   BUN 8 - 23 mg/dL 34  39  48    48   Creatinine 0.61 - 1.24 mg/dL 5.95  6.38  7.56    4.33   Sodium 135 - 145 mmol/L 133  131  133    133   Potassium 3.5 - 5.1 mmol/L 3.6  3.2  3.5    3.5   Chloride 98 - 111 mmol/L 103  101  103    102   CO2 22 - 32 mmol/L 21  23  23    23    Calcium 8.9 - 10.3 mg/dL 7.6  7.4  7.1    7.1    No results found.   Disposition: Status is: Inpatient Remains inpatient appropriate because: Status post ex lap, complicated hospital stay, weak, on TPN  Planned Discharge Destination: Home with Home Health    Time spent .  Author: Marcelino Duster, MD 01/10/2024 9:17 PM Secure chat 7am to 7pm For on call review [email or web address].

## 2024-01-10 NOTE — Progress Notes (Signed)
 Pt right foot noted to be more swollen than my initial assessment and redness. MD notified and new orders are rendered.

## 2024-01-10 NOTE — Progress Notes (Signed)
 [facility] SURGICAL PROGRESS NOTE  Hospital Day(s): 12.   Post op day(s): 6 Days Post-Op.   Interval History:  Patient seen and examined Transferred to 2A Still confused Leukocytosis remains resolved; WBC 7.6K Hgb to 8.4; responded to transfusion Renal function normal; sCr - 0.75; UO - 1425 ccs No significant electrolyte derangements  Drain with 5 ccs out; serous  Ileostomy with good output Continues on Zosyn/Diflucan   Vital signs in last 24 hours: [min-max] current  Temp:  [97.9 F (36.6 C)-98.8 F (37.1 C)] 98 F (36.7 C) (03/12 0500) Pulse Rate:  [59-108] 80 (03/12 0500) Resp:  [12-30] 19 (03/12 0500) BP: (124-159)/(50-128) 153/60 (03/12 0500) SpO2:  [90 %-99 %] 93 % (03/12 0500)     Height: 6\' 1"  (185.4 cm) Weight: 73.1 kg BMI (Calculated): 21.27   Intake/Output last 2 shifts:  03/11 0701 - 03/12 0700 In: 3720.9 [P.O.:1200; I.V.:1656.9; Blood:450; IV Piggyback:414] Out: 1605 [Urine:1425; Drains:5; Stool:165]   Physical Exam:  Constitutional: confused, arouses appropriately, NAD Respiratory: breathing non-labored at rest, on [place] Cardiovascular: regular rate and irregularly irregular Gastrointestinal: soft, expected tenderness, non-distended, no rebound/guarding. 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 Mucus fistula in left abdomen; scant bowel sweat in bag. Drain in right lateral abdomen; serous.  Integumentary: Midline wound healing via secondary intention, there is controlled dehiscence, visible bowel, serosanguinous drainage  Musculoskeletal: Mittens on upper extremities    Labs:     Latest Ref Rng & Units 01/10/2024    4:54 AM 01/09/2024    4:45 AM 01/08/2024    3:12 AM  CBC  WBC 4.0 - 10.5 K/uL 7.6  7.3  8.0   Hemoglobin 13.0 - 17.0 g/dL 8.4  6.9  7.0   Hematocrit 39.0 - 52.0 % 24.8  19.9  20.5   Platelets 150 - 400 K/uL 142  110  112       Latest Ref Rng & Units 01/10/2024    4:54 AM 01/09/2024    4:45 AM  01/08/2024    3:12 AM  CMP  Glucose 70 - 99 mg/dL 409  811  914    782   BUN 8 - 23 mg/dL 34  39  48    48   Creatinine 0.61 - 1.24 mg/dL 9.56  2.13  0.86    5.78   Sodium 135 - 145 mmol/L 133  131  133    133   Potassium 3.5 - 5.1 mmol/L 3.6  3.2  3.5    3.5   Chloride 98 - 111 mmol/L 103  101  103    102   CO2 22 - 32 mmol/L 21  23  23    23    Calcium 8.9 - 10.3 mg/dL 7.6  7.4  7.1    7.1   Total Protein 6.5 - 8.1 g/dL   4.2   Total Bilirubin 0.0 - 1.2 mg/dL   0.5   Alkaline Phos 38 - 126 U/L   143   AST 15 - 41 U/L   33   ALT 0 - 44 U/L   22      Imaging studies: No new pertinent imaging studies   Assessment/Plan: 78 y.o. male 6 Days Post-Op s/p exploratory laparotomy with resection of right ileocolonic anastomosis with creation of end ileostomy and mucus fistula for anastomotic leak following initial right hemicolectomy for colon CA   - Wound Care: We will transition to wound vac, white foam base with  black foam on top, -125 mmHg, Change M/W/F schedule for now. WOC aware but limited availability.    - We can continue CLD as tolerated - Continue TPN; monitor electrolytes  - Continue antimicrobial (Zosyn, Diflucan) - Monitor abdominal examination - Monitor ostomy outputs; WOC following ostomy teaching when feasible - Pain control prn; antiemetics prn - Work with therapies as feasible; anticipate significant deconditioning    - Appreciate cardiology input   - Further management per primary service; we will follow   All of the above findings and recommendations were discussed with the patient, and the medical team, and all of patient's questions were answered to his expressed satisfaction.  -- Lynden Oxford, PA-C [facility] 01/10/2024, 9:00 AM M-F: 7am - 4pm

## 2024-01-10 NOTE — IPAL (Incomplete)
  Interdisciplinary Goals of Care Family Meeting   Date carried out: 01/10/2024  Location of the meeting: {IDGC Location:21390}  Member's involved: {IDGC Participants:[id number]::"Physician","Bedside Registered Nurse","Social Worker","Family Member or next of kin"}  Durable Power of Insurance risk surveyor: ***    Discussion: We discussed goals of care for Alex Silversmith Sr. .  ***  Code status:   Code Status: Limited: Do not attempt resuscitation (DNR) -DNR-LIMITED -Do Not Intubate/DNI    Disposition: {IDGC Disposition:21372}  Time spent for the meeting: ***    [name], NP  01/10/2024, 10:24 PM

## 2024-01-10 NOTE — TOC Progression Note (Signed)
 Transition of Care (TOC) - Progression Note    Patient Details  Name: Alex Soto. MRN: [id number] Date of Birth: Nov 01, 1945  Transition of Care [facility]) CM/SW Contact  Truddie Hidden, RN Phone Number: 01/10/2024, 9:46 AM  Clinical Narrative:    TOC continuing to follow patient's progress throughout discharge planning.        Expected Discharge Plan and Services                                               Social Determinants of Health (SDOH) Interventions SDOH Screenings   Food Insecurity: No Food Insecurity (12/29/2023)  Housing: Low Risk  (12/29/2023)  Transportation Needs: No Transportation Needs (12/29/2023)  Utilities: Not At Risk (12/29/2023)  Depression (PHQ2-9): Low Risk  (12/27/2023)  Financial Resource Strain: Low Risk  (07/18/2023)   Received from [facility]  Social Connections: Moderately Integrated (12/30/2023)  Tobacco Use: Medium Risk (12/30/2023)    Readmission Risk Interventions     No data to display

## 2024-01-10 NOTE — Progress Notes (Signed)
   01/10/24 2300  Spiritual Encounters  Type of Visit Follow up  Care provided to: Pt and family  Conversation partners present during encounter Nurse;Physician  Referral source Code page;Trauma page  Reason for visit Code  OnCall Visit Yes  Spiritual Framework  Presenting Themes Rituals and practive   Chaplain received a rapid response for room 237. Met with the family and escorted them to the chapel while medical staff assisted patient and prayed with them. Chaplain assisted with relaying the family's wishes to the NP. Chaplain stayed with the family while patient was put on comfort care. Chaplain remains available to assist the family.

## 2024-01-10 NOTE — Progress Notes (Signed)
 Physical Therapy Treatment Patient Details Name: Alex BEDEL Sr. MRN: [id number] DOB: 02/27/1946 Today's Date: 01/10/2024   History of Present Illness Pt is a 78 yo s/p exploratory laparatomy and colectomy presenting with abdominal pain and sepsis found to have colonic obstruction and leak on abdominal CT. PMH of DM, dementia, colon cancer. Hospital course was complicated by anastomotic leak.  He was taken back to the OR for resection with ileostomy and mucous fistula placement on 01/04/2024.  He was transferred to the ICU for postoperative care because he could not be extubated after surgery.  He required vasopressors for septic shock in the ICU.  Also had atrial fibrillation with RVR and required IV amiodarone drip.  He was eventually extubated and taken off vasopressors. Re-eval for PT performed on 01/07/24.    PT Comments  Per notes, control dehiscence at abdominal wound. Upon arrival, pt swinging B LEs off bed. Assisted in re-directing and provided therapeutic there-ex to assist in calming strategies. Pt with B mitts on. Wound visualized at beginning and end of session and blanket donned to prevent pt messing with wound. Wound vac supplies at bedside and per RN will be applied today. RN cleared for gentle ROM that doesn't strain abdomen until wound vac applied. Bed alarm activated. Will continue to progress as able- ideally would be great candidate for OT co-treat.    If plan is discharge home, recommend the following: Assist for transportation;Direct supervision/assist for financial management;Assistance with cooking/housework;Help with stairs or ramp for entrance;Two people to help with walking and/or transfers;Two people to help with bathing/dressing/bathroom   Can travel by private vehicle     No  Equipment Recommendations  None recommended by PT    Recommendations for Other Services       Precautions / Restrictions Precautions Precautions: Fall Recall of Precautions/Restrictions:  Impaired Precaution/Restrictions Comments: mittens on B UE Restrictions Weight Bearing Restrictions Per Provider Order: No     Mobility  Bed Mobility               General bed mobility comments: upon arrival, pt bringing legs off bed. Assisted pt back into bed. Appears very restless. Further mobility not performed and pt educated to stay in bed.    Transfers                        Ambulation/Gait                   Stairs             Wheelchair Mobility     Tilt Bed    Modified Rankin (Stroke Patients Only)       Balance                                            Communication Communication Communication: No apparent difficulties  Cognition Arousal: Alert Behavior During Therapy: Restless   PT - Cognitive impairments: History of cognitive impairments                       PT - Cognition Comments: able to state name only. Following commands: Impaired Following commands impaired: Only follows one step commands consistently    Cueing Cueing Techniques: Verbal cues, Tactile cues, Gestural cues  Exercises Other Exercises Other Exercises: Called RN to clear B UE/LE ther-ex. RN agreeable as  long as no straining of abdomen. B shoulder flexion, elbow flex/ext, and knee flex. Pt unable to perform QS or AP. 5 reps performed of each    General Comments        Pertinent Vitals/Pain Pain Assessment Pain Assessment: No/denies pain    Home Living                          Prior Function            PT Goals (current goals can now be found in the care plan section) Acute Rehab PT Goals Patient Stated Goal: unable to state PT Goal Formulation: With patient Time For Goal Achievement: 01/21/24 Potential to Achieve Goals: Good Progress towards PT goals: Progressing toward goals    Frequency    Min 1X/week      PT Plan      Co-evaluation              AM-PAC PT "6 Clicks" Mobility    Outcome Measure  Help needed turning from your back to your side while in a flat bed without using bedrails?: A Lot Help needed moving from lying on your back to sitting on the side of a flat bed without using bedrails?: A Lot Help needed moving to and from a bed to a chair (including a wheelchair)?: Total Help needed standing up from a chair using your arms (e.g., wheelchair or bedside chair)?: Total Help needed to walk in hospital room?: Total Help needed climbing 3-5 steps with a railing? : Total 6 Click Score: 8    End of Session Equipment Utilized During Treatment: Oxygen Activity Tolerance: Patient tolerated treatment well Patient left: in bed;with bed alarm set Nurse Communication: Mobility status PT Visit Diagnosis: Other abnormalities of gait and mobility (R26.89);Difficulty in walking, not elsewhere classified (R26.2);Muscle weakness (generalized) (M62.81);Pain     Time: 1135-1143 PT Time Calculation (min) (ACUTE ONLY): 8 min  Charges:    $Therapeutic Exercise: 8-22 mins PT General Charges $$ ACUTE PT VISIT: 1 Visit                     Elizabeth Palau, PT, DPT, GCS [phone]    Alex Soto 01/10/2024, 11:54 AM

## 2024-01-10 NOTE — Progress Notes (Incomplete)
 Significant Event Progress Note Rapid response   Patient: Alex MOCTEZUMA Sr. ZOX:[id number] DOB: September 23, 1946 DOA: 12/29/2023     12 DOS: the patient was seen and examined on 01/10/2024   Brief hospital course: Gladys Gutman. is a 78 y.o. male  with newly diagnosed colon cancer admitted 2/28 with large bowel obstruction.  s/p exp lap/resection with failed anastomosis, ileostomy and mucous fistula; septic shock and respiratory failure. extubated and off pressors. Transferred to [facility] on 01/07/2024. H/H drop gave PRBC 06/10/24. On TPN, eating poorly  Concern as stated by nurse / staff   Rapid response overhead for patient  -Upon my arrival to bedside, rapid response team at bedside.  Reporting difficulty breathing following an episode of green emesis.  NG tube previously placed and KUB ordered by NP Jon Billings.     Assessment/physical exam Physical Exam Vitals and nursing note reviewed.  Constitutional:      General: He is in acute distress.     Comments: [place] elderly male awake, tachypneic, head of bed elevated, NG tube in place, on NRB, increased work of breathing.  Gurgling/sonorous breath sounds  HENT:     Head: Normocephalic and atraumatic.  Cardiovascular:     Rate and Rhythm: Normal rate and regular rhythm.     Heart sounds: Normal heart sounds.  Pulmonary:     Effort: Tachypnea present.     Comments: Course gurgling breath sounds, Diminished at bases Abdominal:     General: Abdomen is protuberant. There is distension.     Tenderness: There is no abdominal tenderness.     Comments: Distended abdomen.  Wound VAC over surgical wound.  Colostomy left lower quadrant  Musculoskeletal:     Right lower leg: No edema.     Left lower leg: No edema.  Neurological:     Mental Status: Mental status is at baseline.      Workup X-rays personally reviewed.  NG tube not in place, projecting onto right side of the lung.  Distended stomach.  Distended small bowel ABG     Component Value Date/Time   PHART 7.18 (LL) 01/10/2024 2209   PCO2ART 63 (H) 01/10/2024 2209   PO2ART 57 (L) 01/10/2024 2209   HCO3 23.5 01/10/2024 2209   TCO2 23 12/31/2023 0738   ACIDBASEDEF 5.9 (H) 01/10/2024 2209   O2SAT 83.9 01/10/2024 2209     Assessment and  Interventions   Assessment: Ileus versus obstruction in the setting of recent laparotomy with bowel resection and colostomy 3/7 Aspiration with pneumonitis Acute respiratory failure with hypoxia and respiratory acidosis  Plan: Transfer to stepdown Intensivist consult-discussed with NP Cheryll Cockayne Continue supplemental oxygen Follow-up official reports of imaging KUB, CXR and RLE venous ultrasound done earlier Informed surgery of change in patient's condition Several family members who were present were updated on patient's clinical decline and plan to transfer to stepdown.  CODE STATUS discussed.  They wanted him a full code.  Following transfer to stepdown family decided on DNR/DNI and subsequently comfort care       Physical Exam: Vitals:   01/10/24 1510 01/10/24 1938 01/10/24 2104 01/10/24 2127  BP: (!) 155/60 (!) 171/66 (!) 153/51 (!) 151/47  Pulse: 94 90 81 75  Resp: 18 20    Temp: 98 F (36.7 C) 98.3 F (36.8 C)    TempSrc: Axillary     SpO2: 93% 97% 92% (!) 87%  Weight:      Height:        Data Reviewed: Relevant  notes , progress notes from hospitalist and consultants Prior diagnostic testing as pertinent to current admission diagnoses Updated medications and problem lists for reconciliation ED course, including vitals, labs, imaging, treatment and response to treatment    Family Communication: as stated above  CRITICAL CARE Performed by: Andris Baumann   Total critical care time: 74 minutes  Critical care time was exclusive of separately billable procedures and treating other patients.  Critical care was necessary to treat or prevent imminent or life-threatening  deterioration.  Critical care was time spent personally by me on the following activities: development of treatment plan with patient and/or surrogate as well as nursing, discussions with consultants, evaluation of patient's response to treatment, examination of patient, obtaining history from patient or surrogate, ordering and performing treatments and interventions, ordering and review of laboratory studies, ordering and review of radiographic studies, pulse oximetry and re-evaluation of patient's condition.   Author: Andris Baumann, MD 01/10/2024 10:19 PM  For on call review [email or web address].

## 2024-01-10 NOTE — Progress Notes (Signed)
 Pt arrived to ICU around 2200. Noted to be in respiratory distress on non-rebreather mask. Pt also noted to have episode of yellow/bilious emesis. ICU NP at bedside. Goals of care discussion with family per provider. Pt made DNR/DNI and then family opted to proceed to full comfort measures. Morphine gtt initiated per orders. Chaplain at bedside with family. Full head to toe assessment deferred due to comfort status.

## 2024-01-10 NOTE — Progress Notes (Addendum)
 Nutrition Follow-up  DOCUMENTATION CODES:   Severe malnutrition in context of chronic illness  INTERVENTION:   -TPN management per pharmacy -10 units regular insulin added to TPN bag secondary to persistent hyperglycemia   NUTRITION DIAGNOSIS:   Severe Malnutrition related to chronic illness as evidenced by severe fat depletion, severe muscle depletion.  Ongoing  GOAL:   Patient will meet greater than or equal to 90% of their needs  Met with TPN  MONITOR:   Labs, Weight trends, Diet advancement, Skin, I & O's  REASON FOR ASSESSMENT:   NPO/Clear Liquid Diet    ASSESSMENT:   78 y/o male with h/o HTN, HLD, DM, dementia, diverticulitis, GERD, HLD, DJD and inguinal hernia s/p repair who is admitted with LBO secondary to colon mass (Stage IIa colon adenocarcinoma) s/p exploratory laparotomy with right hemicolectomy with primary anastomosis 3/2 complicated by post op ileus, atrial fibrillation with RVR, septic shock and anastamotic leak requiring return to the OR 3/6 for bowel resection with ileostomy and mucous fistula placement.  3/7- extubated 3/8- NGT d/c 3/10- pt removed central line, TPN paused, advanced to clear liquid diet, PICC line replaced  Reviewed I/O's: +2.1 L x 24 hours and +10.8 L since admission  UOP: 1.4 L x 24 hours  Drain output: 5 ml x 24 hours  Ileostomy output: 165 ml x 24 hours  CWCOCN following for ileostomy education.  Pt on a clear liquid diet, however, noted poor oral intake and intermittent nausea. Per general surgery notes, plan to continue TPN and clear liquids.    Pt remains on TPN- receiving at 84 ml/hr with 20% SMOFLIPID. Regimen provides 1835 kcals and 161 grams protein, meeting 92% of estimated kcal needs and >100% of protein needs. Case discussed with pharmacy related to hyperglycemia. Insulin dosage increased yesterday with no improvement. Plan to add insulin to TPN bag today (10 units regular insulin).   Palliative care consult  pending for goals of care discussions.   Medications reviewed and include diflucan.  No wt loss since admission.   Labs reviewed: Na: 133, Phos: 2.3, CBGS: 182-238 (inpatient orders for glycemic control are 0-15 units insulin aspart every 4 hours).    Diet Order:   Diet Order             Diet clear liquid Fluid consistency: Thin  Diet effective now                   EDUCATION NEEDS:   No education needs have been identified at this time  Skin:  Skin Assessment: Reviewed RN Assessment (incision abdomen)  Last BM:  3/7- type 6  Height:   Ht Readings from Last 1 Encounters:  12/30/23 6\' 1"  (1.854 m)    Weight:   Wt Readings from Last 1 Encounters:  01/09/24 73.1 kg    Ideal Body Weight:  83.6 kg  BMI:  Body mass index is 21.26 kg/m.  Estimated Nutritional Needs:   Kcal:  2000-2300kcal/day  Protein:  100-115g/day  Fluid:  1.8-2.1L/day    Levada Schilling, RD, LDN, CDCES Registered Dietitian III Certified Diabetes Care and Education Specialist If unable to reach this RD, please use "RD Inpatient" group chat on secure chat between hours of 8am-4 pm daily

## 2024-01-10 NOTE — Consult Note (Incomplete)
 NAME:  Alex Blackburn., MRN:  [id number], DOB:  11-01-1945, LOS: 12 ADMISSION DATE:  12/29/2023, CONSULTATION DATE:  *** REFERRING MD:  ***, CHIEF COMPLAINT:  ***   History of Present Illness:  ***  Pertinent  Medical History  ***  Significant Hospital Events: Including procedures, antibiotic start and stop dates in addition to other pertinent events     Interim History / Subjective:  ***  Objective   Blood pressure (!) 151/47, pulse 75, temperature 98.3 F (36.8 C), resp. rate 20, height 6\' 1"  (1.854 m), weight 73.1 kg, SpO2 (!) 87%.        Intake/Output Summary (Last 24 hours) at 01/10/2024 2150 Last data filed at 01/10/2024 0600 Gross per 24 hour  Intake 2357.06 ml  Output 960 ml  Net 1397.06 ml   Filed Weights   01/07/24 0425 01/08/24 0402 01/09/24 0336  Weight: 62.5 kg 68.5 kg 73.1 kg    Examination: General: *** HENT: *** Lungs: *** Cardiovascular: *** Abdomen: *** Extremities: *** Neuro: *** GU: ***  Resolved Hospital Problem list   ***  Assessment & Plan:  ***  Best Practice (right click and "Reselect all SmartList Selections" daily)   Diet/type: {diet type:25684} DVT prophylaxis {anticoagulation:25687} Pressure ulcer(s): {pressure ulcer(s):31683} GI prophylaxis: {WU:98119} Lines: {Central Venous Access:25771} Foley:  {Central Venous Access:25691} Code Status:  {Code Status:26939} Last date of multidisciplinary goals of care discussion [***]  Labs   CBC: Recent Labs  Lab 01/04/24 1805 01/05/24 0303 01/06/24 0420 01/07/24 0420 01/08/24 0312 01/09/24 0445 01/10/24 0454  WBC 21.5*   < > 13.0* 9.5 8.0 7.3 7.6  NEUTROABS 20.1*  --   --   --   --   --   --   HGB 10.0*   < > 7.7* 7.3* 7.0* 6.9* 8.4*  HCT 30.3*   < > 22.6* 21.4* 20.5* 19.9* 24.8*  MCV 84.9   < > 82.2 83.6 80.4 79.3* 82.7  PLT 344   < > 156 116* 112* 110* 142*   < > = values in this interval not displayed.    Basic Metabolic Panel: Recent Labs  Lab 01/04/24 0430  01/04/24 1805 01/05/24 0303 01/06/24 0420 01/07/24 0420 01/08/24 0312 01/09/24 0445 01/10/24 0454  NA 134*   < > 135 133* 132* 133*  133* 131* 133*  K 4.5   < > 4.7 3.9 3.8 3.5  3.5 3.2* 3.6  CL 98   < > 101 103 103 103  102 101 103  CO2 24   < > 23 24 24 23  23 23  21*  GLUCOSE 169*   < > 164* 157* 254* 192*  194* 193* 222*  BUN 25*   < > 39* 46* 52* 48*  48* 39* 34*  CREATININE 0.89   < > 1.72* 1.37* 1.09 0.89  0.88 0.77 0.75  CALCIUM 8.2*   < > 7.2* 6.8* 6.8* 7.1*  7.1* 7.4* 7.6*  MG 2.0  --  1.8 2.2  --  2.4 2.2  --   PHOS 4.1  --  5.6* 4.6 3.2 2.4*  2.5 2.3* 2.3*   < > = values in this interval not displayed.   GFR: Estimated Creatinine Clearance: 80 mL/min (by C-G formula based on SCr of 0.75 mg/dL). Recent Labs  Lab 01/04/24 1805 01/04/24 1808 01/04/24 2326 01/05/24 0303 01/05/24 1478 01/05/24 1508 01/05/24 1819 01/06/24 0420 01/07/24 0420 01/08/24 0312 01/09/24 0445 01/10/24 0454  PROCALCITON 16.76  --   --   --   --   --   --   --   --   --   --   --  WBC 21.5*  --   --    < >  --   --   --    < > 9.5 8.0 7.3 7.6  LATICACIDVEN  --    < > 3.0*  --  2.0* 1.5 1.2  --   --   --   --   --    < > = values in this interval not displayed.    Liver Function Tests: Recent Labs  Lab 01/04/24 1805 01/05/24 0303 01/06/24 0420 01/07/24 0420 01/08/24 0312 01/09/24 0445 01/10/24 0454  AST 104* 93*  --   --  33  --   --   ALT 45* 42  --   --  22  --   --   ALKPHOS 58 52  --   --  143*  --   --   BILITOT 0.6 0.7  --   --  0.5  --   --   PROT 4.1* 3.9*  --   --  4.2*  --   --   ALBUMIN 1.8* 1.6* <1.5* <1.5* <1.5*  <1.5* <1.5* 1.6*   No results for input(s): "LIPASE", "AMYLASE" in the last 168 hours. No results for input(s): "AMMONIA" in the last 168 hours.  ABG    Component Value Date/Time   PHART 7.43 01/04/2024 2352   PCO2ART 32 01/04/2024 2352   PO2ART 135 (H) 01/04/2024 2352   HCO3 21.2 01/04/2024 2352   TCO2 23 12/31/2023 0738   ACIDBASEDEF  2.3 (H) 01/04/2024 2352   O2SAT 99.6 01/04/2024 2352     Coagulation Profile: Recent Labs  Lab 01/04/24 1805  INR 1.5*    Cardiac Enzymes: No results for input(s): "CKTOTAL", "CKMB", "CKMBINDEX", "TROPONINI" in the last 168 hours.  HbA1C: Hgb A1c MFr Bld  Date/Time Value Ref Range Status  12/30/2023 06:07 AM 6.9 (H) 4.8 - 5.6 % Final    Comment:    (NOTE) Pre diabetes:          5.7%-6.4%  Diabetes:              >6.4%  Glycemic control for   <7.0% adults with diabetes   06/15/2023 01:48 PM 7.6 (H) 4.8 - 5.6 % Final    Comment:    (NOTE)         Prediabetes: 5.7 - 6.4         Diabetes: >6.4         Glycemic control for adults with diabetes: <7.0     CBG: Recent Labs  Lab 01/10/24 0822 01/10/24 1228 01/10/24 1515 01/10/24 1838 01/10/24 1935  GLUCAP 245* 239* 228* 182* 175*    Review of Systems:   ***  Past Medical History:  He,  has a past medical history of Anemia, Atherosclerosis of abdominal aorta (HCC), Blind right eye, Colon cancer (HCC), Dementia (HCC), 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   Surgical History:   Past Surgical History:  Procedure Laterality Date   AMPUTATION TOE Right 03/01/2020   Procedure: AMPUTATION TOE;  Surgeon: [name], DPM;  Location: [facility] ORS;  Service: Podiatry;  Laterality: Right;   AMPUTATION TOE Right 01/03/2023   Procedure: AMPUTATION TOE, 2nd right;  Surgeon: [name], DPM;  Location: [facility]  ORS;  Service: Podiatry;  Laterality: Right;   APPENDECTOMY     BIOPSY  12/20/2023   Procedure: BIOPSY;  Surgeon: Norma Fredrickson, Boykin Nearing, MD;  Location: [facility] ENDOSCOPY;   Service: Gastroenterology;;   BONE EXCISION Right 01/03/2023   Procedure: Exostectomy interphalangeal joint right great toe;  Surgeon: [name], DPM;  Location: [facility] ORS;  Service: Podiatry;  Laterality: Right;   BOWEL RESECTION N/A 01/04/2024   Procedure: RESECTION OF ANASTOMOSIS, TAKE DOWN SPLENIC FLEXTURE;  Surgeon: Kandis Cocking, MD;  Location: [facility] ORS;  Service: General;  Laterality: N/A;   CATARACT EXTRACTION W/PHACO Left 09/06/2021   Procedure: CATARACT EXTRACTION PHACO AND INTRAOCULAR LENS PLACEMENT (IOC) LEFT 2.71 00:30.6;  Surgeon: [place] Crane, MD;  Location: [facility];  Service: Ophthalmology;  Laterality: Left;  Diabetic   COLONOSCOPY WITH PROPOFOL N/A 12/20/2023   Procedure: COLONOSCOPY WITH PROPOFOL;  Surgeon: Toledo, Boykin Nearing, MD;  Location: [facility] ENDOSCOPY;  Service: Gastroenterology;  Laterality: N/A;   COLOSTOMY N/A 01/04/2024   Procedure: CREATION, END ILEOSTOMY AND MUCOUS FISTULA;  Surgeon: Kandis Cocking, MD;  Location: [facility] ORS;  Service: General;  Laterality: N/A;   ESOPHAGOGASTRODUODENOSCOPY (EGD) WITH PROPOFOL N/A 12/20/2023   Procedure: ESOPHAGOGASTRODUODENOSCOPY (EGD) WITH PROPOFOL;  Surgeon: Toledo, Boykin Nearing, MD;  Location: [facility] ENDOSCOPY;  Service: Gastroenterology;  Laterality: N/A;   EYE SURGERY     age 38   HERNIA REPAIR     INSERTION OF MESH  06/23/2023   Procedure: INSERTION OF MESH;  Surgeon: Sung Amabile, DO;  Location: [facility] ORS;  Service: General;;   KIDNEY STONE SURGERY     LAPAROTOMY Right 12/31/2023   Procedure: EXPLORATORY LAPAROTOMY WITH RIGHT COLECTOMY;  Surgeon: Kandis Cocking, MD;  Location: [facility] ORS;  Service: General;  Laterality: Right;   LAPAROTOMY N/A 01/04/2024   Procedure: LAPAROTOMY, EXPLORATORY;  Surgeon: Kandis Cocking, MD;  Location: [facility] ORS;  Service: General;  Laterality: N/A;   REMOVAL RETAINED LENS Right 04/19/2021   Procedure: CATARACT EXTRACTION PHACO,  RIGHT VISION BLUE 0.20 00:01.4 case aborted;  Surgeon:  [place] Crane, MD;  Location: [facility];  Service: Ophthalmology;  Laterality: Right;  Diabetic - oral meds   SHOULDER ARTHROSCOPY WITH BICEPS TENDON REPAIR Right 10/06/2016   Procedure: SHOULDER ARTHROSCOPY WITH BICEPS TENDON REPAIR;  Surgeon: Christena Flake, MD;  Location: [facility] ORS;  Service: Orthopedics;  Laterality: Right;   SHOULDER ARTHROSCOPY WITH OPEN ROTATOR CUFF REPAIR Right 10/06/2016   Procedure: SHOULDER ARTHROSCOPY WITH OPEN ROTATOR CUFF REPAIR;  Surgeon: Christena Flake, MD;  Location: [facility] ORS;  Service: Orthopedics;  Laterality: Right;   SHOULDER ARTHROSCOPY WITH SUBACROMIAL DECOMPRESSION Right 10/06/2016   Procedure: SHOULDER ARTHROSCOPY WITH SUBACROMIAL DECOMPRESSION AND DEBRIDEMENT;  Surgeon: Christena Flake, MD;  Location: [facility] ORS;  Service: Orthopedics;  Laterality: Right;   SUBMUCOSAL TATTOO INJECTION  12/20/2023   Procedure: SUBMUCOSAL TATTOO INJECTION;  Surgeon: Toledo, Boykin Nearing, MD;  Location: [facility] ENDOSCOPY;  Service: Gastroenterology;;   TONSILLECTOMY       Social History:   reports that he quit smoking about 54 years ago. His smoking use included cigarettes. He started smoking about 64 years ago. He has a 2.5 pack-year smoking history. He has been exposed to tobacco smoke. He has quit using smokeless tobacco. He reports that he does not drink alcohol and does not use drugs.   Family History:  His Family history is unknown by patient.   Allergies No Known Allergies   Home Medications  Prior to Admission  medications   Medication Sig Start Date End Date Taking? Authorizing Provider  aspirin 81 MG tablet Take 81 mg by mouth daily.   Yes [provider]  atorvastatin (LIPITOR) 80 MG tablet Take 80 mg by mouth daily.   Yes [provider]  Cholecalciferol (VITAMIN D3) 50 MCG (2000 UT) TABS Take 1 tablet by mouth daily.   Yes [provider]  cyanocobalamin (VITAMIN B12) 1000 MCG tablet Take 1,000 mcg by mouth daily.   Yes [provider]  ferrous sulfate 325 (65 FE) MG tablet Take 325 mg by mouth 2 (two) times daily with a meal.   Yes [provider]  glipiZIDE (GLUCOTROL) 5 MG tablet Take 5 mg by mouth daily.   Yes [provider]  losartan-hydrochlorothiazide (HYZAAR) 100-12.5 MG tablet Take 1 tablet by mouth daily.   Yes [provider]  metFORMIN (GLUCOPHAGE) 500 MG tablet Take 500 mg by mouth 2 (two) times daily with a meal.   Yes [provider]  pioglitazone (ACTOS) 15 MG tablet Take 15 mg by mouth daily.   Yes [provider]  rivastigmine (EXELON) 3 MG capsule Take 3 mg by mouth 2 (two) times daily.   Yes [provider]     Critical care time: ***     Cheryll Cockayne Rust-Chester, AGACNP-BC Acute Care Nurse Practitioner [facility]   [phone] / [phone] Please see Amion for details.

## 2024-01-10 NOTE — Progress Notes (Signed)
 Rapid Response Event Note   Reason for Call :  Respiratory distress  Initial Focused Assessment:   Patient went into respiratory distress after NG tube was placed.  Rapid response nurse, Dr.Duncan and respiratory staff at bedside.   Interventions:  Patient placed on non rebreather, NG tube removed. Portable chest x-ray performed.  Plan of Care:   Patient transferred to ICU 15 for further treament,  Event Summary:   MD Notified:  Call Time: Arrival Time: End Time:  Eloisa Northern, RN

## 2024-01-11 ENCOUNTER — Inpatient Hospital Stay: Payer: Medicare PPO

## 2024-01-11 DIAGNOSIS — J9601 Acute respiratory failure with hypoxia: Secondary | ICD-10-CM

## 2024-01-11 LAB — GLUCOSE, CAPILLARY: Glucose-Capillary: 136 mg/dL — ABNORMAL HIGH (ref 70–99)

## 2024-01-30 NOTE — Death Summary Note (Signed)
 DEATH SUMMARY   Patient Details  Name: Alex MCCABE Sr. 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  Hospital Diagnoses: Principal Problem:   Large bowel obstruction (HCC) Active Problems:   HTN (hypertension), benign   Type 2 diabetes mellitus (HCC)   Malignant neoplasm of transverse colon (HCC)   Dementia with behavioral disturbance (HCC)   Electrolyte abnormality   Colon adenocarcinoma (HCC)   Palliative care encounter   Hypotension   Paroxysmal atrial fibrillation with RVR (HCC)   Protein-calorie malnutrition, severe   Septic shock (HCC)   Acute respiratory failure with hypoxia and hypercapnia [facility])   Hospital Course: Alex Thibeau. is a 78 y.o. male with medical history significant for DM, HTN, Lewy body dementia, recently diagnosed adenocarcinoma of the colon on colonoscopy 12/20/2023 that was done for iron deficiency anemia and abdominal pain.  She saw her oncologist on 12/27/2023 to discuss therapy for newly diagnosed colon cancer.  He presented to the hospital with nausea, vomiting and worsening right lower quadrant abdominal pain, found to have large bowel obstruction.     Patient underwent exploratory laparotomy with right hemicolectomy and primary anastomosis on 12/31/2023.  Hospital course was complicated by anastomotic leak.  He was taken back to the OR for resection with ileostomy and mucous fistula placement on 01/04/2024.  He was transferred to the ICU for postoperative care because he could not be extubated after surgery.  He required vasopressors for septic shock in the ICU.  Also had atrial fibrillation with RVR and required IV amiodarone drip.  He was eventually extubated and taken off vasopressors.  Patient subsequently transferred to the hospitalist service on 01/07/2024.   Patient was closely monitored on the progressive care unit for postop blood loss anemia, A-fib with RVR, hypoxia, acute kidney injury, electrolyte abnormalities.  General surgery followed him closely s/p repair of anastomotic leak resection with ileostomy and fistula placement.  Patient has been requiring 3 to 6 L supplemental oxygen and on 01/10/2024 night rapid response team called due to difficulty breathing and episode of vomiting.  Patient was tachypneic, tachycardic, abdomen distended.  NG tube placed, was transferred to ICU on nonrebreather. Patient's clinical condition worsened.  After discussion with family patient is made DNR and comfort care.  Patient expired 02/05/24 at 1:12 AM.      Procedures: s/p exploratory laparotomy with resection of right ileocolonic anastomosis with creation of end ileostomy and mucus fistula for anastomotic leak   Consultations: General surgery, critical care, cardiology, oncology  The results of significant diagnostics from this hospitalization (including imaging, microbiology, ancillary and laboratory) are listed below for reference.   Significant Diagnostic Studies: US Venous Img Lower Bilateral (DVT) Result Date: 01/10/2024 CLINICAL DATA:  Leg swelling. EXAM: BILATERAL LOWER EXTREMITY VENOUS DOPPLER ULTRASOUND TECHNIQUE: Gray-scale sonography with compression, as well as color and duplex ultrasound, were performed to evaluate the deep venous system(s) from the level of the common femoral vein through the popliteal and proximal calf veins. COMPARISON:  None Available. FINDINGS: VENOUS Normal compressibility of the common femoral, superficial femoral, and popliteal veins, as well as the visualized calf veins. Visualized portions of profunda femoral vein and great saphenous vein unremarkable. No filling defects to suggest DVT on grayscale or color Doppler imaging.  Doppler waveforms show  normal direction of venous flow, normal respiratory plasticity and response to augmentation. OTHER Right popliteal fossa fluid collection measuring 4.5 x 1.4 x 2.5 cm typical of a Baker cyst. Limitations: none IMPRESSION: No evidence of DVT in either lower extremity. Electronically Signed   By: Narda Rutherford M.D.   On: 01/10/2024 21:53   DG Abd 1 View Result Date: 01/10/2024 CLINICAL DATA:  Ileus.  NG tube placement.  Respiratory distress. EXAM: ABDOMEN - 1 VIEW; PORTABLE CHEST - 1 VIEW COMPARISON:  Abdominopelvic CT 01/04/2024, additional prior radiographs reviewed. FINDINGS: Chest: Enteric tube courses into the right chest, tip projecting over the right upper quadrant of the abdomen. This does not follow the course of the esophagus as enteric tubes have on prior exams. Right upper extremity central line tip in the region of the right atrium. The heart is mildly enlarged. Heterogeneous bilateral lung opacities, slight perihilar predominant. Small pleural effusions. No pneumothorax. Abdomen: Tip of the enteric tube projects over the right upper quadrant, however does not follow the expected course of the esophagus. Findings are concerning for bronchial placement. Gaseous small bowel distension in the left abdomen, small bowel up to 4.9 cm. There is a drain in the right abdomen. IMPRESSION: 1. Tip of the enteric tube projects over the right upper quadrant, however does not follow the expected course of the esophagus. Findings are concerning for bronchial placement. There is mild patient rotation which limits assessment. Recommend removal and replacement. 2. Heterogeneous bilateral lung opacities, perihilar predominant. Findings may represent edema or airspace disease/pneumonia. Small pleural effusions. 3. Gaseous small bowel distension in the left abdomen, ileus versus obstruction. Electronically Signed   By: Narda Rutherford M.D.   On: 01/10/2024 21:51   DG Chest Port 1 View Result Date: 01/10/2024 CLINICAL  DATA:  Ileus.  NG tube placement.  Respiratory distress. EXAM: ABDOMEN - 1 VIEW; PORTABLE CHEST - 1 VIEW COMPARISON:  Abdominopelvic CT 01/04/2024, additional prior radiographs reviewed. FINDINGS: Chest: Enteric tube courses into the right chest, tip projecting over the right upper quadrant of the abdomen. This does not follow the course of the esophagus as enteric tubes have on prior exams. Right upper extremity central line tip in the region of the right atrium. The heart is mildly enlarged. Heterogeneous bilateral lung opacities, slight perihilar predominant. Small pleural effusions. No pneumothorax. Abdomen: Tip of the enteric tube projects over the right upper quadrant, however does not follow the expected course of the esophagus. Findings are concerning for bronchial placement. Gaseous small bowel distension in the left abdomen, small bowel up to 4.9 cm. There is a drain in the right abdomen. IMPRESSION: 1. Tip of the enteric tube projects over the right upper quadrant, however does not follow the expected course of the esophagus. Findings are concerning for bronchial placement. There is mild patient rotation which limits assessment. Recommend removal and replacement. 2. Heterogeneous bilateral lung opacities, perihilar predominant. Findings may represent edema or airspace disease/pneumonia. Small pleural effusions. 3. Gaseous small bowel distension in the left abdomen, ileus versus obstruction. Electronically Signed   By: Narda Rutherford M.D.   On: 01/10/2024 21:51   [place] EKG SITE RITE Result Date: 01/08/2024 If Site Rite image not attached, placement could not be confirmed due to current cardiac rhythm.  ECHOCARDIOGRAM COMPLETE Result Date: 01/05/2024    ECHOCARDIOGRAM REPORT   Patient Name:   JOZIAH DOLLINS Sr. Date of Exam: 01/05/2024 Medical Rec #:  [id number]  Height:       73.0 in Accession #:    [id number]         Weight:       150.1 lb Date of Birth:  1945/11/12         BSA:          1.905 m  Patient Age:    77 years           BP:           89/59 mmHg Patient Gender: M                  HR:           64 bpm. Exam Location:  [facility] Procedure: 2D Echo, Color Doppler and Cardiac Doppler (Both Spectral and Color            Flow Doppler were utilized during procedure). Indications:     Atrial Fibrillation I48.91  History:         Patient has no prior history of Echocardiogram examinations.                  Risk Factors:Hypertension, Dyslipidemia and Diabetes.  Sonographer:     Cristela Blue Referring Phys:  [id number] CARALYN HUDSON Diagnosing Phys: [name] MD IMPRESSIONS  1. Left ventricular ejection fraction, by estimation, is 55 to 60%. The left ventricle has normal function. The left ventricle has no regional wall motion abnormalities. Left ventricular diastolic parameters are consistent with Grade I diastolic dysfunction (impaired relaxation).  2. Right ventricular systolic function is normal. The right ventricular size is normal.  3. The mitral valve is normal in structure. Trivial mitral valve regurgitation.  4. The aortic valve is normal in structure. Aortic valve regurgitation is not visualized. FINDINGS  Left Ventricle: Left ventricular ejection fraction, by estimation, is 55 to 60%. The left ventricle has normal function. The left ventricle has no regional wall motion abnormalities. Strain was performed and the global longitudinal strain is indeterminate. The left ventricular internal cavity size was normal in size. There is borderline left ventricular hypertrophy. Left ventricular diastolic parameters are consistent with Grade I diastolic dysfunction (impaired relaxation). Right Ventricle: The right ventricular size is normal. No increase in right ventricular wall thickness. Right ventricular systolic function is normal. Left Atrium: Left atrial size was normal in size. Right Atrium: Right atrial size was normal in size. Pericardium: There is no evidence of pericardial effusion. Mitral Valve: The  mitral valve is normal in structure. Trivial mitral valve regurgitation. MV peak gradient, 2.8 mmHg. The mean mitral valve gradient is 2.0 mmHg. Tricuspid Valve: The tricuspid valve is normal in structure. Tricuspid valve regurgitation is mild. Aortic Valve: The aortic valve is normal in structure. Aortic valve regurgitation is not visualized. Aortic valve mean gradient measures 4.3 mmHg. Aortic valve peak gradient measures 8.4 mmHg. Aortic valve area, by VTI measures 1.92 cm. Pulmonic Valve: The pulmonic valve was normal in structure. Pulmonic valve regurgitation is trivial. Aorta: The ascending aorta was not well visualized. IAS/Shunts: No atrial level shunt detected by color flow Doppler. Additional Comments: 3D was performed not requiring image post processing on an independent workstation and was indeterminate.  LEFT VENTRICLE PLAX 2D LVIDd:         4.50 cm   Diastology LVIDs:         3.20 cm   LV e' medial:    9.57 cm/s LV PW:         1.10 cm  LV E/e' medial:  6.4 LV IVS:        1.30 cm   LV e' lateral:   11.70 cm/s LVOT diam:     2.00 cm   LV E/e' lateral: 5.2 LV SV:         44 LV SV Index:   23 LVOT Area:     3.14 cm  RIGHT VENTRICLE RV Basal diam:  2.80 cm RV Mid diam:    3.00 cm RV S prime:     15.60 cm/s TAPSE (M-mode): 1.6 cm LEFT ATRIUM           Index        RIGHT ATRIUM           Index LA diam:      2.70 cm 1.42 cm/m   RA Area:     12.30 cm LA Vol (A2C): 60.9 ml 31.97 ml/m  RA Volume:   22.40 ml  11.76 ml/m LA Vol (A4C): 22.8 ml 11.97 ml/m  AORTIC VALVE AV Area (Vmax):    1.74 cm AV Area (Vmean):   1.80 cm AV Area (VTI):     1.92 cm AV Vmax:           145.33 cm/s AV Vmean:          96.400 cm/s AV VTI:            0.231 m AV Peak Grad:      8.4 mmHg AV Mean Grad:      4.3 mmHg LVOT Vmax:         80.60 cm/s LVOT Vmean:        55.100 cm/s LVOT VTI:          0.141 m LVOT/AV VTI ratio: 0.61  AORTA Ao Root diam: 3.57 cm MITRAL VALVE               TRICUSPID VALVE MV Area (PHT): 2.07 cm    TR Peak  grad:   30.0 mmHg MV Area VTI:   1.92 cm    TR Vmax:        274.00 cm/s MV Peak grad:  2.8 mmHg MV Mean grad:  2.0 mmHg    SHUNTS MV Vmax:       0.83 m/s    Systemic VTI:  0.14 m MV Vmean:      58.4 cm/s   Systemic Diam: 2.00 cm MV Decel Time: 366 msec MV E velocity: 61.30 cm/s MV A velocity: 74.40 cm/s MV E/A ratio:  0.82 Dwayne D Callwood MD Electronically signed by [name] MD Signature Date/Time: 01/05/2024/11:39:55 PM    Final    DG Chest Port 1 View Result Date: 01/04/2024 CLINICAL DATA:  [id number].  Check endotracheal tube positioning. EXAM: PORTABLE CHEST 1 VIEW COMPARISON:  Portable chest today at 2:34 p.m. FINDINGS: 6:28 p.m. ETT tip is 5.9 cm from the carina roughly mid tracheal. NGT curves to the left and cephalad within the stomach with the tip in the proximal fundus. Left subclavian line tip remains in the proximal SVC. Multiple overlying monitor wires. Electrical pads overlie the lower left chest. The heart is slightly enlarged. The mediastinum is normally outlined. Aortic atherosclerosis. Mild central vascular prominence is again noted without overt edema. There are small pleural effusions, overlying atelectasis or consolidation in the bases and elevated right diaphragm. Remaining lungs clear with no new or worsened findings. IMPRESSION: 1. ETT tip 5.9 cm from the carina roughly mid tracheal. 2. NGT adequately placed as above.  3. Left subclavian line tip remains in the proximal SVC. 4. Small pleural effusions, overlying atelectasis or consolidation in the bases and elevated right diaphragm. Overall aeration seems unchanged. 5. Aortic atherosclerosis. Electronically Signed   By: Almira Bar M.D.   On: 01/04/2024 20:45   DG Chest 1 View Result Date: 01/04/2024 CLINICAL DATA:  Central line placement. EXAM: CHEST  1 VIEW COMPARISON:  12/16/2022 FINDINGS: Normal sized heart. Tortuous and partially calcified thoracic aorta. Poor inspiration with interval bibasilar atelectasis. Interval left  subclavian catheter with its tip in the proximal superior vena cava no pneumothorax. Diffuse osteopenia. IMPRESSION: 1. Interval left subclavian catheter placement with its tip in the proximal superior vena cava. No pneumothorax. 2. Poor inspiration with interval bibasilar atelectasis. Electronically Signed   By: Beckie Salts M.D.   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 Hypotension, tachycardia, abdominal pain and suspected anastomotic leak clinically. EXAM: CT ABDOMEN AND PELVIS WITH CONTRAST TECHNIQUE: Multidetector CT imaging of the abdomen and pelvis was performed using the standard protocol following bolus administration of intravenous contrast. RADIATION DOSE REDUCTION: This exam was performed according to the departmental dose-optimization program which includes automated exposure control, adjustment of the mA and/or kV according to patient size and/or use of iterative reconstruction technique. CONTRAST:  OMNIPAQUE IOHEXOL 300 MG/ML  SOLN COMPARISON:  12/29/2023 FINDINGS: Lower chest: Small bilateral pleural effusions, right greater than left with associated bibasilar atelectasis. Hepatobiliary: No focal liver abnormality is seen. Some gallbladder distension without overt gallbladder inflammation, visible calculi or biliary ductal dilatation. Pancreas: Atrophic pancreas without inflammation or masses. No pancreatic ductal dilatation. Spleen: Normal in size without focal abnormality. Adrenals/Urinary Tract: Adrenal glands are unremarkable. Kidneys are normal, without renal calculi, focal lesion, or hydronephrosis. Tiny amount of air in the bladder lumen likely relates to prior catheterization. Stomach/Bowel: Small amount of fluid in the stomach. Air in multiple small bowel loops with mild small bowel dilatation  approaching 4 cm in maximum diameter. Findings are consistent with ileus. At the level of the right-sided ileocolic anastomosis located just along the inferior aspect of the gallbladder, there is suspicion by CT of an anastomotic leak with visible air communicating from the anterior aspect of the anastomosis to a large pocket of air and fluid in the anterior right perihepatic space continuing superiorly to the subphrenic space. This pocket of fluid and air measures up to 12.5 cm in transverse diameter and 16 cm in height. Multiple other pockets of free air are present in the anterior peritoneal cavity including the left subphrenic space superior to the stomach. Multiple areas of associated free fluid throughout the peritoneal cavity with the second largest area of fluid along the lateral left upper quadrant and left subphrenic space abutting the spleen. Dependent fluid also present in the pelvis. Vascular/Lymphatic: Stable severe atherosclerosis of the abdominal aorta without aneurysmal disease. No enlarged lymph nodes identified in the abdomen or pelvis. Reproductive: Prostate is unremarkable. Other: No hernias. Musculoskeletal: No acute or significant osseous findings. IMPRESSION: 1. Findings consistent with anastomotic leak at the level of the right-sided ileocolic anastomosis located just along the inferior aspect of the gallbladder. There is visible air communicating from the anterior aspect of the anastomosis to a large pocket of air and fluid in the anterior right perihepatic space continuing superiorly to the subphrenic space. This pocket of fluid and air measures  up to 12.5 cm in transverse diameter and 16 cm in height. Multiple other pockets of free air are present in the anterior peritoneal cavity including the left subphrenic space superior to the stomach. Multiple areas of associated free fluid throughout the peritoneal cavity with the second largest area of fluid along the lateral left upper quadrant  and left subphrenic space abutting the spleen. Dependent fluid also present in the pelvis. 2. Small bilateral pleural effusions, right greater than left with associated bibasilar atelectasis. 3. Atrophic pancreas without inflammation or masses. 4. Stable severe atherosclerosis of the abdominal aorta without aneurysmal disease. 5. I reviewed findings directly in person with Dr. Maurine Minister shortly after the CT was performed. Electronically Signed   By: Irish Lack M.D.   On: 01/04/2024 13:35   DG Abd 1 View Result Date: 01/04/2024 CLINICAL DATA:  Abdominal distention. Postoperative day 4 from exploratory laparotomy and right hemicolectomy. EXAM: ABDOMEN - 1 VIEW COMPARISON:  01/01/2024 FINDINGS: Gas dilated small bowel loops are identified in the left abdomen measuring up to 4.7 cm diameter. Gas dilated loops in the lower left abdomen and right abdomen are less distended. No substantial colonic gas. Rigler Sign associated with small bowel in the left abdomen and lucency over the liver is consistent with intraperitoneal free air. Surgical skin staples overlie the midline. IMPRESSION: 1. Gas dilated small bowel loops in the left abdomen with less distended loops in the lower left abdomen and right abdomen. Given the patient is 4 days out from surgery, features are felt to be compatible with postoperative ileus. As of all vein obstruction is not excluded, close follow-up warranted. 2. Intraperitoneal free air is not unexpected given recent surgery. Electronically Signed   By: Kennith Center M.D.   On: 01/04/2024 08:23   DG Abd Portable 1V Result Date: 01/01/2024 CLINICAL DATA:  Nasogastric tube placement. Radiologic records indicates laparotomy yesterday. EXAM: PORTABLE ABDOMEN - 1 VIEW COMPARISON:  Radiograph 12/30/2023 FINDINGS: Tip and side port of the enteric tube below the diaphragm in the stomach. No bowel dilatation in the upper abdomen. Midline abdominal staples seen. Free air under the right hemidiaphragm is  not unexpected given recent abdominal surgery. IMPRESSION: 1. Tip and side port of the enteric tube below the diaphragm in the stomach. 2. Free air under the right hemidiaphragm is not unexpected given recent abdominal surgery. Electronically Signed   By: Narda Rutherford M.D.   On: 01/01/2024 21:39   DG Abd 1 View Result Date: 12/30/2023 CLINICAL DATA:  Nasogastric tube placement. EXAM: ABDOMEN - 1 VIEW COMPARISON:  Earlier today FINDINGS: Tip and side port of the enteric tube below the diaphragm in the stomach. Gaseous small bowel distension centrally. IMPRESSION: Tip and side port of the enteric tube below the diaphragm in the stomach. Electronically Signed   By: Narda Rutherford M.D.   On: 12/30/2023 18:02   DG Abd 1 View Result Date: 12/30/2023 CLINICAL DATA:  Abdominal distention, watery stools. EXAM: ABDOMEN - 1 VIEW COMPARISON:  CT abdomen pelvis dated 12/29/2023 FINDINGS: Multiple dilated loops of small bowel are seen in the midabdomen, similar to prior exam accounting for differences in technique. Gas overlies the rectum. Degenerative changes are seen in the spine. IMPRESSION: Multiple dilated loops of small bowel in the midabdomen, similar to prior exam accounting for differences in technique. These findings likely reflect bowel obstruction. Electronically Signed   By: Romona Curls M.D.   On: 12/30/2023 16:59   CT ABDOMEN PELVIS W CONTRAST Result Date: 12/29/2023 CLINICAL  DATA:  Nausea, vomiting, generalized abdominal pain for 1-2 days. Recent diagnosis of colon cancer. Rule out obstruction. EXAM: CT ABDOMEN AND PELVIS WITH CONTRAST TECHNIQUE: Multidetector CT imaging of the abdomen and pelvis was performed using the standard protocol following bolus administration of intravenous contrast. RADIATION DOSE REDUCTION: This exam was performed according to the departmental dose-optimization program which includes automated exposure control, adjustment of the mA and/or kV according to patient size  and/or use of iterative reconstruction technique. CONTRAST:  OMNIPAQUE IOHEXOL 300 MG/ML  SOLN COMPARISON:  CT abdomen and pelvis 10/14/2023 FINDINGS: Lower chest: No acute abnormality. Hepatobiliary: No focal hepatic lesion. Unremarkable gallbladder and biliary tree. Pancreas: Unremarkable. Spleen: Unremarkable. Adrenals/Urinary Tract: Stable adrenal glands. No urinary calculi or hydronephrosis. Unremarkable bladder. Stomach/Bowel: Stomach is within normal limits. Fluid-filled mid and distal small bowel at the upper limits of normal in caliber and increased compared to 10/14/2023. The cecum and ascending colon are fluid-filled with abrupt transition point in the proximal transverse colon at the site of the mass seen on colonoscopy 12/20/2023. There is increased wall thickening of the cecum and ascending colon compared to 10/14/2023. There are few additional segments of wall thickening in the mid and distal small bowel. Diverticulosis in the descending colon. Trace adjacent stranding and fluid. Vascular/Lymphatic: Aortic atherosclerosis. No enlarged abdominal or pelvic lymph nodes. Reproductive: No acute abnormality. Other: No free intraperitoneal air. Musculoskeletal: No acute fracture.  No destructive osseous lesion. IMPRESSION: 1. Worsening obstruction in the proximal transverse colon secondary to the known adenocarcinoma compared with CT 10/14/2023. 2. Increased wall thickening of the cecum and ascending colon compared to 10/14/2023. There are few additional segments of wall thickening in the mid and distal small bowel. Findings are favored to represent nonspecific enterocolitis. 3. Question mild diverticulitis in the descending colon. 4. Aortic Atherosclerosis (ICD10-I70.0). Electronically Signed   By: Minerva Fester M.D.   On: 12/29/2023 19:18    Microbiology: Recent Results (from the past 240 hours)  MRSA Next Gen by PCR, Nasal     Status: None   Collection Time: 01/04/24  9:25 AM   Specimen:  Nasal Mucosa; Nasal Swab  Result Value Ref Range Status   MRSA by PCR Next Gen NOT DETECTED NOT DETECTED Final    Comment: (NOTE) The GeneXpert MRSA Assay (FDA approved for NASAL specimens only), is one component of a comprehensive MRSA colonization surveillance program. It is not intended to diagnose MRSA infection nor to guide or monitor treatment for MRSA infections. Test performance is not FDA approved in patients less than 58 years old. Performed at [facility], [address] [place], [place] [address]     Time spent: 34 minutes  Signed: Marcelino Duster, MD 01/03/2024

## 2024-01-30 NOTE — IPAL (Incomplete)
  Interdisciplinary Goals of Care Family Meeting   Date carried out: 01/10/2024  Location of the meeting: Conference room  Member's involved: Nurse Practitioner, Chaplain, and Family Member or next of kin  Durable Power of Attorney or Environmental health practitioner: spouse,     Discussion: We discussed goals of care for Alex Silversmith Sr. .  ***  Code status:   Code Status: Limited: Do not attempt resuscitation (DNR) -DNR-LIMITED -Do Not Intubate/DNI    Disposition: {IDGC Disposition:21372}  Time spent for the meeting: ***    [name], NP  01/10/2024, 10:24 PM

## 2024-01-30 NOTE — Consult Note (Signed)
 NAME:  Alex Soto., MRN:  [id number], DOB:  December 07, 1945, LOS: 13 ADMISSION DATE:  12/29/2023, CONSULTATION DATE: 01/04/2024 REFERRING MD: , CHIEF COMPLAINT: Dr. 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 Donneta Romberg.  He presented to [facility] ER on 02/28 from the cancer center for possible bowel obstruction.  Per ER notes the pt c/o generalized abdominal pain along with nausea/vomiting, onset several days prior to presentation.  He also had poor po intake.    ED Course In the ER significant lab results were: Na+ 130/K+ 3.2/chloride 91/glucose 194/BUN 29/wbc 3.8/hgb 10.7.  CT Abd/Pelvis concerning for worsening obstruction in the proximal transverse colon secondary to the known adenocarcinoma and possible nonspecific enterocolitis.  It was reported the pt did have a bowel movement prior to arrival to the ER.  Medications received in the ER were: 1L IV fluid bolus/zofrain/morphine.  General Surgery consulted and recommended hospital admission and to keep pt NPO for potential procedure on 02/29.  Pt admitted to the medsurg unit per hospitalist team.  See detailed hospital course below under significant events.  CT Abd/Pelvis W Contrast: Worsening obstruction in the proximal transverse colon secondary to the known adenocarcinoma compared with CT 10/14/2023. Increased wall thickening of the cecum and ascending colon compared to 10/14/2023. There are few additional segments of wall thickening in the mid and distal small bowel. Findings are favored to represent nonspecific enterocolitis. Question mild diverticulitis in the descending colon. Aortic Atherosclerosis (ICD10-I70.0).  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  Degenerative Joint Disease of Lumbar Spine GERD   HLD Benign HTN  Kidney Stones Hypercholesterolemia  Type II Diabetes Mellitis  CKD Stage II   Anti-infectives (From admission, onward)    Start     Dose/Rate Route Frequency Ordered Stop   01/05/24 2000  fluconazole (DIFLUCAN) IVPB 400 mg  Status:  Discontinued        400 mg 100 mL/hr over 120 Minutes Intravenous Every 24 hours 01/04/24 1859 01/10/24 2235   01/04/24 2000  fluconazole (DIFLUCAN) IVPB 800 mg        800 mg 200 mL/hr over 120 Minutes Intravenous  Once 01/04/24 1859 01/04/24 2216   01/04/24 1400  piperacillin-tazobactam (ZOSYN) IVPB 3.375 g  Status:  Discontinued        3.375 g 12.5 mL/hr over 240 Minutes Intravenous Every 8 hours 01/04/24 1233 01/10/24 2235   01/01/24 0600  piperacillin-tazobactam (ZOSYN) IVPB 3.375 g        3.375 g 12.5 mL/hr over 240 Minutes Intravenous Every 8 hours 12/31/23 1427 01/02/24 0945   12/31/23 2200  metroNIDAZOLE (FLAGYL) tablet 1,000 mg  Status:  Discontinued        1,000 mg Oral  Once 12/30/23 1138 12/30/23 1832   12/31/23 2200  neomycin (MYCIFRADIN) tablet 1,000 mg  Status:  Discontinued        1,000 mg Oral  Once 12/30/23 1138 12/30/23 1832   12/31/23 1500  metroNIDAZOLE (FLAGYL) tablet 1,000 mg  Status:  Discontinued        1,000 mg Oral  Once 12/30/23 1138 12/30/23 1832   12/31/23 1500  neomycin (MYCIFRADIN) tablet 1,000 mg  Status:  Discontinued        1,000 mg Oral  Once 12/30/23 1138 12/30/23 1832   12/31/23 1400  metroNIDAZOLE (FLAGYL) tablet 1,000 mg  Status:  Discontinued        1,000 mg Oral  Once 12/30/23 1138 12/30/23 1832   12/31/23 1400  neomycin (MYCIFRADIN) tablet 1,000 mg  Status:  Discontinued        1,000 mg Oral  Once 12/30/23 1138 12/30/23 1832   12/31/23 0800  ertapenem (INVANZ) 1 g in sodium chloride 0.9 % 100 mL IVPB        1 g 200 mL/hr over 30 Minutes Intravenous  Once 12/30/23 1832 12/31/23 0858   12/30/23 2200  neomycin (MYCIFRADIN) tablet 1,000 mg  Status:  Discontinued        1,000 mg Oral  Once  12/30/23 0915 12/30/23 1138   12/30/23 2200  metroNIDAZOLE (FLAGYL) tablet 1,000 mg  Status:  Discontinued        1,000 mg Oral  Once 12/30/23 0915 12/30/23 1138   12/30/23 1500  neomycin (MYCIFRADIN) tablet 1,000 mg  Status:  Discontinued        1,000 mg Oral  Once 12/30/23 0915 12/30/23 1138   12/30/23 1500  metroNIDAZOLE (FLAGYL) tablet 1,000 mg  Status:  Discontinued        1,000 mg Oral  Once 12/30/23 0915 12/30/23 1138   12/30/23 1400  neomycin (MYCIFRADIN) tablet 1,000 mg  Status:  Discontinued        1,000 mg Oral  Once 12/30/23 0915 12/30/23 1138   12/30/23 1400  metroNIDAZOLE (FLAGYL) tablet 1,000 mg  Status:  Discontinued        1,000 mg Oral  Once 12/30/23 0915 12/30/23 1138      Significant Hospital Events: Including procedures, antibiotic start and stop dates in addition to other pertinent events   02/28: Admitted to the medsurg unit with obstructing colon cancer  03/01: General Surgery consulted with recommendations to try a slow bowel prep over 2 days in anticipation of surgery.  However, following ingestion of a quarter of the full prep pt developed worsening abdominal distension.  KUB concerning for dilated loops of small bowel concerning for obstruction.  NGT placed by general surgery for decompression with plans to take pt to the OR on 03/2 for exploratory laparotomy and right colectomy. 03/02: Pt underwent exploratory laparotomy right hemicolectomy with primary anastomosis per general surgery  03/04: NGT removed pt had bowel movement and continued to pass flatus.  However, upon general surgery examination pt noted to be slightly distended.  Pathology report consistent with T3 N0 colon cancer all margins negative  03/05: Palliative Care consulted to address goals of treatments: decision to continue full scope of care  03/06: Rapid response initiated due to pt developing hypotension and atrial fibrillation with rvr.  Pt also cold/clammy with abdominal pain and worsening  distention.  He received 1L IV fluid bolus and amiodarone gtt initiated, however he remained hypotensive requiring transfer to ICU.  Cardiology consulted.  CT Abd/Pelvis finding consistent with a leak.  Pt taken back to the OR for exploratory lap requiring anastomotic resection; end ileostomy; and fistula. Postop pt remained mechanically intubated  03/07: No significant events overnight. Remains on vent, on minimal vent settings (28% FiO2 & 5 PEEP).  Will plan for WUA and SBT as tolerated, optimistic for extubation. Shock, sepsis, and lactic acidosis improving, weaned to 3 mcg Levophed, vasopressin weaned off. 03/08: Remains on high flow nasal cannula at 6 L, hemodynamically stable.  Abdominal incision with intact dressing.  Fluctuating heart rate at the beginning of the shift but now stable 03/09:  transferred to [facility], pulled out PICC line. Poor PO intake, abdominal discomfort 03/12: Overnight patient began vomiting and was suspected to have an aspiration event, leading to respiratory failure. Transferred to ICU. HIGH RISK for intubation.  Interim History / Subjective:  Patient in respiratory failure on NRB, continuing to actively vomit. HIGH RISK for intubation.   Objective   Blood pressure (!) 109/48, pulse 90, temperature 98.3 F (36.8 C), resp. rate (!) 27, height 6\' 1"  (1.854 m), weight 73.1 kg, SpO2 (!) 87%.    FiO2 (%):  [100 %] 100 %   Intake/Output Summary (Last 24 hours) at 01/26/2024 0024 Last data filed at 01/18/2024 0000 Gross per 24 hour  Intake 2453.21 ml  Output 1010 ml  Net 1443.21 ml   Filed Weights   01/07/24 0425 01/08/24 0402 01/09/24 0336  Weight: 62.5 kg 68.5 kg 73.1 kg    Examination: General: Adult male, critically ill, lying in bed, NAD HEENT: MM pink/moist, anicteric, atraumatic, neck supple Neuro: A&O x 1, unable to follow commands, PERRL +3, MAE CV: s1s2 RRR, NSR on monitor, no r/m/g Pulm: Regular, labored on  NRB, breath sounds coarse crackles  throughout GI: soft, ostomy/ mucus fistula/ NPWT in place, non tender, hypoactive bs GU: foley in place with clear yellow urine Skin:  no rashes/lesions noted Extremities: warm/dry, pulses + 2 R/P, no edema noted  Resolved Hospital Problem list     Assessment & Plan:  #Acute Hypoxic/ Hypercapnic Respiratory Failure secondary to suspected aspiration of gastric contents - HIGH RISK for INTUBATION - Continue HHFNC overnight, wean FiO2 as tolerated - Supplemental O2 to maintain SpO2 > 90% - Intermittent chest x-ray & ABG PRN - Ensure adequate pulmonary hygiene  - F/u cultures, trend PCT - Continue Aspiration Pna coverage: zosyn - bronchodilators PRN  #Suspected Ileus vs Obstruction #Large bowel obstruction secondary to colon cancer cancer s/p hemicolectomy 03/2 #Anastomotic leak s/p ex-lap anastomotic resection, end ileostomy, and mucous fistula - General surgery consulted appreciate input  - place new NGT to LIS  - Keep NPO for now, continue TPN - follow up imaging PRN, unable to safely obtain CT abdomen/pelvis at this time - continue zosyn & fluconazole - daily CBC, monitor WBC/fever trend - discussed case with surgery on call in agreement with recommendations above  #Atrial Fibrillation  - continue Amiodarone  - Cardiology consulted, appreciate input - Continuous cardiac monitoring  #Lewy body dementia - supportive care  #Type II diabetes mellitus  - CBG's q4hrs  - SSI  - Target CBG range 140 to 180  - Follow hyper/hypoglycemic protocol   Best Practice (right click and "Reselect all SmartList Selections" daily)  Diet/type: NPO DVT prophylaxis SCD Pressure ulcer(s): N/A GI prophylaxis: PPI Lines: Central line and yes and it is still needed Foley:  Yes, and it is still needed Code Status:  full code Last date of multidisciplinary goals of care discussion [see GOC discussions in iPAL notes]  Labs   CBC: Recent Labs  Lab 01/04/24 1805 01/05/24 0303  01/06/24 0420 01/07/24 0420 01/08/24 0312 01/09/24 0445 01/10/24 0454  WBC 21.5*   < > 13.0* 9.5 8.0 7.3 7.6  NEUTROABS 20.1*  --   --   --   --   --   --   HGB 10.0*   < > 7.7* 7.3* 7.0* 6.9* 8.4*  HCT 30.3*   < > 22.6* 21.4* 20.5* 19.9* 24.8*  MCV 84.9   < > 82.2 83.6 80.4 79.3* 82.7  PLT 344   < >  156 116* 112* 110* 142*   < > = values in this interval not displayed.    Basic Metabolic Panel: Recent Labs  Lab 01/04/24 0430 01/04/24 1805 01/05/24 0303 01/06/24 0420 01/07/24 0420 01/08/24 0312 01/09/24 0445 01/10/24 0454  NA 134*   < > 135 133* 132* 133*  133* 131* 133*  K 4.5   < > 4.7 3.9 3.8 3.5  3.5 3.2* 3.6  CL 98   < > 101 103 103 103  102 101 103  CO2 24   < > 23 24 24 23  23 23  21*  GLUCOSE 169*   < > 164* 157* 254* 192*  194* 193* 222*  BUN 25*   < > 39* 46* 52* 48*  48* 39* 34*  CREATININE 0.89   < > 1.72* 1.37* 1.09 0.89  0.88 0.77 0.75  CALCIUM 8.2*   < > 7.2* 6.8* 6.8* 7.1*  7.1* 7.4* 7.6*  MG 2.0  --  1.8 2.2  --  2.4 2.2  --   PHOS 4.1  --  5.6* 4.6 3.2 2.4*  2.5 2.3* 2.3*   < > = values in this interval not displayed.   GFR: Estimated Creatinine Clearance: 80 mL/min (by C-G formula based on SCr of 0.75 mg/dL). Recent Labs  Lab 01/04/24 1805 01/04/24 1808 01/04/24 2326 01/05/24 0303 01/05/24 0409 01/05/24 1508 01/05/24 1819 01/06/24 0420 01/07/24 0420 01/08/24 0312 01/09/24 0445 01/10/24 0454  PROCALCITON 16.76  --   --   --   --   --   --   --   --   --   --   --   WBC 21.5*  --   --    < >  --   --   --    < > 9.5 8.0 7.3 7.6  LATICACIDVEN  --    < > 3.0*  --  2.0* 1.5 1.2  --   --   --   --   --    < > = values in this interval not displayed.    Liver Function Tests: Recent Labs  Lab 01/04/24 1805 01/05/24 0303 01/06/24 0420 01/07/24 0420 01/08/24 0312 01/09/24 0445 01/10/24 0454  AST 104* 93*  --   --  33  --   --   ALT 45* 42  --   --  22  --   --   ALKPHOS 58 52  --   --  143*  --   --   BILITOT 0.6 0.7  --   --   0.5  --   --   PROT 4.1* 3.9*  --   --  4.2*  --   --   ALBUMIN 1.8* 1.6* <1.5* <1.5* <1.5*  <1.5* <1.5* 1.6*   No results for input(s): "LIPASE", "AMYLASE" in the last 168 hours.  No results for input(s): "AMMONIA" in the last 168 hours.  ABG    Component Value Date/Time   PHART 7.18 (LL) 01/10/2024 2209   PCO2ART 63 (H) 01/10/2024 2209   PO2ART 57 (L) 01/10/2024 2209   HCO3 23.5 01/10/2024 2209   TCO2 23 12/31/2023 0738   ACIDBASEDEF 5.9 (H) 01/10/2024 2209   O2SAT 83.9 01/10/2024 2209     Coagulation Profile: Recent Labs  Lab 01/04/24 1805  INR 1.5*    Cardiac Enzymes: No results for input(s): "CKTOTAL", "CKMB", "CKMBINDEX", "TROPONINI" in the last 168 hours.  HbA1C: Hgb A1c MFr Bld  Date/Time Value Ref Range Status  12/30/2023 06:07 AM 6.9 (H) 4.8 - 5.6 % Final    Comment:    (NOTE) Pre diabetes:          5.7%-6.4%  Diabetes:              >6.4%  Glycemic control for   <7.0% adults with diabetes   06/15/2023 01:48 PM 7.6 (H) 4.8 - 5.6 % Final    Comment:    (NOTE)         Prediabetes: 5.7 - 6.4         Diabetes: >6.4         Glycemic control for adults with diabetes: <7.0     CBG: Recent Labs  Lab 01/10/24 0822 01/10/24 1228 01/10/24 1515 01/10/24 1838 01/10/24 1935  GLUCAP 245* 239* 228* 182* 175*    Review of Systems:   Unable to assess pt unable to participate in interview at this time.  Past Medical History:  He,  has a past medical history of Anemia, Atherosclerosis of abdominal aorta (HCC), Blind right eye, Colon cancer (HCC), Dementia (HCC), 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    Surgical History:   Past Surgical History:  Procedure Laterality Date   AMPUTATION TOE Right 03/01/2020   Procedure: AMPUTATION TOE;  Surgeon: [name], DPM;  Location: [facility] ORS;  Service: Podiatry;  Laterality: Right;   AMPUTATION TOE Right 01/03/2023   Procedure: AMPUTATION TOE, 2nd right;  Surgeon: [name], DPM;  Location: [facility] ORS;  Service: Podiatry;  Laterality: Right;   APPENDECTOMY     BIOPSY  12/20/2023   Procedure: BIOPSY;  Surgeon: Norma Fredrickson, Boykin Nearing, MD;  Location: [facility] ENDOSCOPY;  Service: Gastroenterology;;   BONE EXCISION Right 01/03/2023   Procedure: Exostectomy interphalangeal joint right great toe;  Surgeon: [name], DPM;  Location: [facility] ORS;  Service: Podiatry;  Laterality: Right;   BOWEL RESECTION N/A 01/04/2024   Procedure: RESECTION OF ANASTOMOSIS, TAKE DOWN SPLENIC FLEXTURE;  Surgeon: Kandis Cocking, MD;  Location: [facility] ORS;  Service: General;  Laterality: N/A;   CATARACT EXTRACTION W/PHACO Left 09/06/2021   Procedure: CATARACT EXTRACTION PHACO AND INTRAOCULAR LENS PLACEMENT (IOC) LEFT 2.71 00:30.6;  Surgeon: Nevada Crane, MD;  Location: [facility];  Service: Ophthalmology;  Laterality: Left;  Diabetic   COLONOSCOPY WITH PROPOFOL N/A 12/20/2023   Procedure: COLONOSCOPY WITH PROPOFOL;  Surgeon: Toledo, Boykin Nearing, MD;  Location: [facility] ENDOSCOPY;  Service: Gastroenterology;  Laterality: N/A;   COLOSTOMY N/A 01/04/2024   Procedure: CREATION, END ILEOSTOMY AND MUCOUS FISTULA;  Surgeon: Kandis Cocking, MD;  Location: [facility] ORS;  Service: General;  Laterality: N/A;   ESOPHAGOGASTRODUODENOSCOPY (EGD) WITH PROPOFOL N/A 12/20/2023   Procedure: ESOPHAGOGASTRODUODENOSCOPY (EGD) WITH PROPOFOL;  Surgeon: Toledo, Boykin Nearing, MD;  Location: [facility] ENDOSCOPY;  Service: Gastroenterology;  Laterality: N/A;   EYE SURGERY     age 67   HERNIA REPAIR     INSERTION OF MESH  06/23/2023   Procedure: INSERTION OF MESH;  Surgeon: Sung Amabile, DO;  Location: [facility]  ORS;  Service: General;;   KIDNEY STONE SURGERY     LAPAROTOMY Right 12/31/2023   Procedure: EXPLORATORY LAPAROTOMY WITH RIGHT COLECTOMY;  Surgeon: Kandis Cocking, MD;  Location: [facility] ORS;  Service: General;  Laterality: Right;   LAPAROTOMY N/A 01/04/2024   Procedure: LAPAROTOMY, EXPLORATORY;  Surgeon: Kandis Cocking, MD;  Location: [facility] ORS;  Service: General;  Laterality: N/A;   REMOVAL RETAINED LENS Right 04/19/2021   Procedure: CATARACT EXTRACTION PHACO,  RIGHT VISION BLUE 0.20 00:01.4 case aborted;  Surgeon: Nevada Crane, MD;  Location: [facility];  Service: Ophthalmology;  Laterality: Right;  Diabetic - oral meds   SHOULDER ARTHROSCOPY WITH BICEPS TENDON REPAIR Right 10/06/2016   Procedure: SHOULDER ARTHROSCOPY WITH BICEPS TENDON REPAIR;  Surgeon: Christena Flake, MD;  Location: [facility] ORS;  Service: Orthopedics;  Laterality: Right;   SHOULDER ARTHROSCOPY WITH OPEN ROTATOR CUFF REPAIR Right 10/06/2016   Procedure: SHOULDER ARTHROSCOPY WITH OPEN ROTATOR CUFF REPAIR;  Surgeon: Christena Flake, MD;  Location: [facility] ORS;  Service: Orthopedics;  Laterality: Right;   SHOULDER ARTHROSCOPY WITH SUBACROMIAL DECOMPRESSION Right 10/06/2016   Procedure: SHOULDER ARTHROSCOPY WITH SUBACROMIAL DECOMPRESSION AND DEBRIDEMENT;  Surgeon: Christena Flake, MD;  Location: [facility] ORS;  Service: Orthopedics;  Laterality: Right;   SUBMUCOSAL TATTOO INJECTION  12/20/2023   Procedure: SUBMUCOSAL TATTOO INJECTION;  Surgeon: Toledo, Boykin Nearing, MD;  Location: [facility] ENDOSCOPY;  Service: Gastroenterology;;   TONSILLECTOMY       Social History:   reports that he quit smoking about 54 years ago. His smoking use included cigarettes. He started smoking about 64 years ago. He has a 2.5 pack-year smoking history. He has been exposed to tobacco smoke. He has quit using smokeless tobacco. He reports that he does not drink alcohol and does not use drugs.   Family History:  His Family history is unknown by patient.    Allergies No Known Allergies   Home Medications  Prior to Admission medications   Medication Sig Start Date End Date Taking? Authorizing Provider  aspirin 81 MG tablet Take 81 mg by mouth daily.   Yes [provider]  atorvastatin (LIPITOR) 80 MG tablet Take 80 mg by mouth daily.   Yes [provider]  Cholecalciferol (VITAMIN D3) 50 MCG (2000 UT) TABS Take 1 tablet by mouth daily.   Yes [provider]  cyanocobalamin (VITAMIN B12) 1000 MCG tablet Take 1,000 mcg by mouth daily.   Yes [provider]  ferrous sulfate 325 (65 FE) MG tablet Take 325 mg by mouth 2 (two) times daily with a meal.   Yes [provider]  glipiZIDE (GLUCOTROL) 5 MG tablet Take 5 mg by mouth daily.   Yes [provider]  losartan-hydrochlorothiazide (HYZAAR) 100-12.5 MG tablet Take 1 tablet by mouth daily.   Yes [provider]  metFORMIN (GLUCOPHAGE) 500 MG tablet Take 500 mg by mouth 2 (two) times daily with a meal.   Yes [provider]  pioglitazone (ACTOS) 15 MG tablet Take 15 mg by mouth daily.   Yes [provider]  rivastigmine (EXELON) 3 MG capsule Take 3 mg by mouth 2 (two) times daily.   Yes [provider]     Critical care time: 55 minutes     Betsey Holiday, AGACNP-BC Acute Care Nurse Practitioner [facility]   [phone] / [phone] Please see Amion for details.

## 2024-01-30 NOTE — Significant Event (Signed)
 Rapid Response Event Note   Reason for Call :  Respiratory distress  Initial Focused Assessment:  Rapid response RN arrived in patient's room with patient sitting in bed with HOB elevated surrounded by 2A staff, AC RN, and RTs. This RN followed by Dr. Para March. Patient with elevated work of breathing, bilateral coarse crackles,  RR upper 20s, restless and confused (though history of dementia per 2A staff). BP 137/78, oxygen saturations 88-92 % on 14 L when previously had been on 3 L [place]. Per 2A staff, patient vomited and patient had decline in respiratory status. Staff had placed NG tube per order prior to rapid response RN arrival and were waiting on abdominal x-ray results for placement confirmation. RT transitioned patient to non-rebreather mask which improved oxygen saturations to upper 90s.  Interventions:  Dr. Para March ordered a chest x-ray and spoke with family who at the time wanted full scope of treatment. Abdominal x-ray results so NG tube not in stomach and so was removed.   Plan of Care:  Patient transferred to ICU 15. Hali Marry RN assumed care of patient and ICU NP at bedside to assess patient.  Event Summary:  MD Notified: Dr.Duncan Call Time: 21:13 Arrival Time: End Time: 22:01   Bennie Dallas, RN  Upon arrival in ICU, ICU NP gave orders for labs and patient placed on heated high flow nasal cannula. Family discussion held by ICU NP and patient made DNR/DNI. Patient then had another episode of vomiting. Mouth suctioned and zofran given per orders, but afterwards patient less responsive, work of breathing increased. ICU NP held another discussion with family and patient transitioned to comfort measures only. Morphine infusion started per orders.

## 2024-01-30 DEATH — deceased
